# Patient Record
Sex: Female | Born: 1975 | Race: Black or African American | Hispanic: No | Smoking: Current every day smoker
Health system: Southern US, Community
[De-identification: ages and names within clinical notes are randomized; demographics above are authoritative.]

## PROBLEM LIST (undated history)

## (undated) DIAGNOSIS — E119 Type 2 diabetes mellitus without complications: Secondary | ICD-10-CM

## (undated) DIAGNOSIS — G4730 Sleep apnea, unspecified: Secondary | ICD-10-CM

## (undated) DIAGNOSIS — I10 Essential (primary) hypertension: Secondary | ICD-10-CM

## (undated) DIAGNOSIS — Z5189 Encounter for other specified aftercare: Secondary | ICD-10-CM

## (undated) HISTORY — PX: TONSILLECTOMY: SUR1361

## (undated) HISTORY — PX: SPLENECTOMY: SUR1306

---

## 2004-04-26 ENCOUNTER — Emergency Department (HOSPITAL_COMMUNITY): Admission: EM | Admit: 2004-04-26 | Discharge: 2004-04-26 | Payer: Self-pay | Admitting: Emergency Medicine

## 2014-02-23 ENCOUNTER — Encounter (HOSPITAL_BASED_OUTPATIENT_CLINIC_OR_DEPARTMENT_OTHER): Payer: Self-pay | Admitting: *Deleted

## 2014-02-23 ENCOUNTER — Emergency Department (HOSPITAL_BASED_OUTPATIENT_CLINIC_OR_DEPARTMENT_OTHER): Payer: Medicare HMO

## 2014-02-23 ENCOUNTER — Emergency Department (HOSPITAL_BASED_OUTPATIENT_CLINIC_OR_DEPARTMENT_OTHER)
Admission: EM | Admit: 2014-02-23 | Discharge: 2014-02-23 | Disposition: A | Discharge status: Home / Self Care | Payer: Medicare HMO | Attending: Emergency Medicine | Admitting: Emergency Medicine

## 2014-02-23 DIAGNOSIS — K2100 Gastro-esophageal reflux disease with esophagitis, without bleeding: Secondary | ICD-10-CM

## 2014-02-23 DIAGNOSIS — Z79899 Other long term (current) drug therapy: Secondary | ICD-10-CM | POA: Diagnosis not present

## 2014-02-23 DIAGNOSIS — Z720 Tobacco use: Secondary | ICD-10-CM | POA: Insufficient documentation

## 2014-02-23 DIAGNOSIS — I10 Essential (primary) hypertension: Secondary | ICD-10-CM | POA: Insufficient documentation

## 2014-02-23 DIAGNOSIS — E119 Type 2 diabetes mellitus without complications: Secondary | ICD-10-CM | POA: Insufficient documentation

## 2014-02-23 DIAGNOSIS — G4730 Sleep apnea, unspecified: Secondary | ICD-10-CM | POA: Insufficient documentation

## 2014-02-23 DIAGNOSIS — Z794 Long term (current) use of insulin: Secondary | ICD-10-CM | POA: Insufficient documentation

## 2014-02-23 DIAGNOSIS — R079 Chest pain, unspecified: Secondary | ICD-10-CM | POA: Diagnosis present

## 2014-02-23 HISTORY — DX: Type 2 diabetes mellitus without complications: E11.9

## 2014-02-23 HISTORY — DX: Essential (primary) hypertension: I10

## 2014-02-23 HISTORY — DX: Sleep apnea, unspecified: G47.30

## 2014-02-23 LAB — CBC
HEMATOCRIT: 40.7 % (ref 36.0–46.0)
Hemoglobin: 13.1 g/dL (ref 12.0–15.0)
MCH: 26.5 pg (ref 26.0–34.0)
MCHC: 32.2 g/dL (ref 30.0–36.0)
MCV: 82.4 fL (ref 78.0–100.0)
PLATELETS: 378 10*3/uL (ref 150–400)
RBC: 4.94 MIL/uL (ref 3.87–5.11)
RDW: 13.4 % (ref 11.5–15.5)
WBC: 11.4 10*3/uL — ABNORMAL HIGH (ref 4.0–10.5)

## 2014-02-23 LAB — COMPREHENSIVE METABOLIC PANEL
ALBUMIN: 3.6 g/dL (ref 3.5–5.2)
ALT: 21 U/L (ref 0–35)
AST: 20 U/L (ref 0–37)
Alkaline Phosphatase: 106 U/L (ref 39–117)
Anion gap: 9 (ref 5–15)
BUN: 13 mg/dL (ref 6–23)
CO2: 27 mmol/L (ref 19–32)
CREATININE: 0.63 mg/dL (ref 0.50–1.10)
Calcium: 8.9 mg/dL (ref 8.4–10.5)
Chloride: 96 mmol/L (ref 96–112)
Glucose, Bld: 330 mg/dL — ABNORMAL HIGH (ref 70–99)
Potassium: 3.9 mmol/L (ref 3.5–5.1)
Sodium: 132 mmol/L — ABNORMAL LOW (ref 135–145)
TOTAL PROTEIN: 8.4 g/dL — AB (ref 6.0–8.3)

## 2014-02-23 LAB — TROPONIN I: Troponin I: <0.03 ng/mL (ref <0.031)

## 2014-02-23 LAB — LIPASE, BLOOD: Lipase: 28 U/L (ref 11–59)

## 2014-02-23 MED ORDER — FAMOTIDINE 20 MG PO TABS
40.0000 mg | ORAL_TABLET | Freq: Once | ORAL | Status: AC
  Administered 2014-02-23: 40 mg via ORAL
  Filled 2014-02-23: qty 2

## 2014-02-23 MED ORDER — SUCRALFATE 1 G PO TABS: 1.0000 g | ORAL_TABLET | Freq: Four times a day (QID) | ORAL | Status: DC

## 2014-02-23 MED ORDER — PANTOPRAZOLE SODIUM 20 MG PO TBEC: 20.0000 mg | DELAYED_RELEASE_TABLET | Freq: Every day | ORAL | Status: DC

## 2014-02-23 MED ORDER — GI COCKTAIL ~~LOC~~
30.0000 mL | Freq: Once | ORAL | Status: AC
  Administered 2014-02-23: 30 mL via ORAL
  Filled 2014-02-23: qty 30

## 2014-02-23 NOTE — ED Notes (Addendum)
Pt c/o central chest pain x 1 day, seen by PMD yesterday for same. Asprin 650 mg PTA, f/u with cardiologist

## 2014-02-23 NOTE — ED Provider Notes (Signed)
CSN: [id number]     Arrival date & time 02/23/14  1745 History  This chart was scribed for Toy Baker, MD by Freida Busman, ED Scribe. This patient was seen in room MH12/MH12 and the patient's care was started 6:11 PM.    Chief Complaint  Patient presents with  . Chest Pain     The history is provided by the patient. No language interpreter was used.     HPI Comments:  Cheryl Stokes is a 39 y.o. female who presents to the Emergency Department complaining of constant non radiating epigastric pain that initially started several days ago but has progressively worsened since onset. She reports associated nausea. She states she was evaluated by her PCP yesterday for her pain and was told there wasn't a lot of oxygen going to her heart. She was referred to a cardiologist for follow up. Her pain is exacerbated when ambulating. She has taken Tums without relief. She has also taken ASA today with minimal relief. She also notes burping alleviated some of her pain. She denies diaphoresis, SOB and vomiting. No anginal type symptoms   Past Medical History  Diagnosis Date  . Diabetes mellitus without complication   . Hypertension   . Sleep apnea    Past Surgical History  Procedure Laterality Date  . Tonsillectomy     History reviewed. No pertinent family history. History  Substance Use Topics  . Smoking status: Current Every Day Smoker -- 0.50 packs/day    Types: Cigarettes  . Smokeless tobacco: Not on file  . Alcohol Use: No   OB History    No data available     Review of Systems  Constitutional: Negative for fever and diaphoresis.  Respiratory: Negative for shortness of breath.   Gastrointestinal: Positive for nausea and abdominal pain. Negative for vomiting.  All other systems reviewed and are negative.     Allergies  Metformin and related and Mobic  Home Medications   Prior to Admission medications   Medication Sig Start Date End Date Taking? Authorizing Provider   hydrochlorothiazide (HYDRODIURIL) 25 MG tablet Take 25 mg by mouth daily.   Yes Historical Provider, MD  insulin lispro protamine-lispro (HUMALOG 75/25 MIX) (75-25) 100 UNIT/ML SUSP injection Inject 30 Units into the skin daily with breakfast.   Yes Historical Provider, MD  insulin lispro protamine-lispro (HUMALOG 75/25 MIX) (75-25) 100 UNIT/ML SUSP injection Inject 15 Units into the skin daily with supper.   Yes Historical Provider, MD  oxyCODONE-acetaminophen (PERCOCET) 10-325 MG per tablet Take 1 tablet by mouth every 4 (four) hours as needed for pain.   Yes Historical Provider, MD  traZODone (DESYREL) 150 MG tablet Take 150 mg by mouth at bedtime.   Yes Historical Provider, MD   There were no vitals taken for this visit. Physical Exam  Constitutional: She is oriented to person, place, and time. She appears well-developed and well-nourished.  Non-toxic appearance. No distress.  HENT:  Head: Normocephalic and atraumatic.  Eyes: Conjunctivae, EOM and lids are normal. Pupils are equal, round, and reactive to light.  Neck: Normal range of motion. Neck supple. No tracheal deviation present. No thyroid mass present.  Cardiovascular: Normal rate, regular rhythm and normal heart sounds.  Exam reveals no gallop.   No murmur heard. Pulmonary/Chest: Effort normal and breath sounds normal. No stridor. No respiratory distress. She has no decreased breath sounds. She has no wheezes. She has no rhonchi. She has no rales.  Abdominal: Soft. Normal appearance and bowel sounds are normal.  She exhibits no distension. There is tenderness (Epigastric ). There is no rebound and no CVA tenderness.  Musculoskeletal: Normal range of motion. She exhibits no edema or tenderness.  Neurological: She is alert and oriented to person, place, and time. She has normal strength. No cranial nerve deficit or sensory deficit. GCS eye subscore is 4. GCS verbal subscore is 5. GCS motor subscore is 6.  Skin: Skin is warm and dry. No  abrasion and no rash noted.  Psychiatric: She has a normal mood and affect. Her speech is normal and behavior is normal.  Nursing note and vitals reviewed.   ED Course  Procedures  DIAGNOSTIC STUDIES:  Oxygen Saturation is 99% on ra, nl by my interpretation.    COORDINATION OF CARE:  6:17 PM Will order a CXR and blood work including cardiac enzymes.  Discussed treatment plan with pt at bedside and pt agreed to plan.  Labs Review Labs Reviewed  TROPONIN I  CBC  COMPREHENSIVE METABOLIC PANEL    Imaging Review No results found.   EKG Interpretation None        Date: 02/23/2014  Rate: 86  Rhythm: normal sinus rhythm  QRS Axis: right  Intervals: normal  ST/T Wave abnormalities: normal  Conduction Disutrbances:none  Narrative Interpretation:   Old EKG Reviewed: none available   MDM   Final diagnoses:  None   I personally performed the services described in this documentation, which was scribed in my presence. The recorded information has been reviewed and is accurate.    7:51 PM Patient given GI cocktail and feels better. Suspect the patient has GERD and do not think that this represents ACS or any acute cardiopulmonary condition. Patient be placed on medications for reflux and discharged home  Toy BakerAnthony T Rickard Kennerly, MD 02/23/14 [phone]

## 2014-02-23 NOTE — ED Notes (Signed)
Patient transported to X-ray 

## 2014-02-23 NOTE — Discharge Instructions (Signed)
Esophagitis Esophagitis is inflammation of the esophagus. It can involve swelling, soreness, and pain in the esophagus. This condition can make it difficult and painful to swallow. CAUSES  Most causes of esophagitis are not serious. Many different factors can cause esophagitis, including:  Gastroesophageal reflux disease (GERD). This is when acid from your stomach flows up into the esophagus.  Recurrent vomiting.  An allergic-type reaction.  Certain medicines, especially those that come in large pills.  Ingestion of harmful chemicals, such as household cleaning products.  Heavy alcohol use.  An infection of the esophagus.  Radiation treatment for cancer.  Certain diseases such as sarcoidosis, Crohn's disease, and scleroderma. These diseases may cause recurrent esophagitis. SYMPTOMS   Trouble swallowing.  Painful swallowing.  Chest pain.  Difficulty breathing.  Nausea.  Vomiting.  Abdominal pain. DIAGNOSIS  Your caregiver will take your history and do a physical exam. Depending upon what your caregiver finds, certain tests may also be done, including:  Barium X-ray. You will drink a solution that coats the esophagus, and X-rays will be taken.  Endoscopy. A lighted tube is put down the esophagus so your caregiver can examine the area.  Allergy tests. These can sometimes be arranged through follow-up visits. TREATMENT  Treatment will depend on the cause of your esophagitis. In some cases, steroids or other medicines may be given to help relieve your symptoms or to treat the underlying cause of your condition. Medicines that may be recommended include:  Viscous lidocaine, to soothe the esophagus.  Antacids.  Acid reducers.  Proton pump inhibitors.  Antiviral medicines for certain viral infections of the esophagus.  Antifungal medicines for certain fungal infections of the esophagus.  Antibiotic medicines, depending on the cause of the esophagitis. HOME CARE  INSTRUCTIONS   Avoid foods and drinks that seem to make your symptoms worse.  Eat small, frequent meals instead of large meals.  Avoid eating for the 3 hours prior to your bedtime.  If you have trouble taking pills, use a pill splitter to decrease the size and likelihood of the pill getting stuck or injuring the esophagus on the way down. Drinking water after taking a pill also helps.  Stop smoking if you smoke.  Maintain a healthy weight.  Wear loose-fitting clothing. Do not wear anything tight around your waist that causes pressure on your stomach.  Raise the head of your bed 6 to 8 inches with wood blocks to help you sleep. Extra pillows will not help.  Only take over-the-counter or prescription medicines as directed by your caregiver. SEEK IMMEDIATE MEDICAL CARE IF:  You have severe chest pain that radiates into your arm, neck, or jaw.  You feel sweaty, dizzy, or lightheaded.  You have shortness of breath.  You vomit blood.  You have difficulty or pain with swallowing.  You have bloody or black, tarry stools.  You have a fever.  You have a burning sensation in the chest more than 3 times a week for more than 2 weeks.  You cannot swallow, drink, or eat.  You drool because you cannot swallow your saliva. MAKE SURE YOU:  Understand these instructions.  Will watch your condition.  Will get help right away if you are not doing well or get worse. Document Released: 02/09/2004 Document Revised: 03/26/2011 Document Reviewed: 09/01/2010 [employer] Patient Information 2015 [employer]. This information is not intended to replace advice given to you by your health care provider. Make sure you discuss any questions you have with your health care provider.  

## 2014-02-23 NOTE — ED Notes (Signed)
MD at bedside. 

## 2014-03-09 ENCOUNTER — Emergency Department (HOSPITAL_BASED_OUTPATIENT_CLINIC_OR_DEPARTMENT_OTHER): Payer: Medicare HMO

## 2014-03-09 ENCOUNTER — Encounter (HOSPITAL_BASED_OUTPATIENT_CLINIC_OR_DEPARTMENT_OTHER): Payer: Self-pay | Admitting: *Deleted

## 2014-03-09 ENCOUNTER — Emergency Department (HOSPITAL_BASED_OUTPATIENT_CLINIC_OR_DEPARTMENT_OTHER)
Admission: EM | Admit: 2014-03-09 | Discharge: 2014-03-09 | Disposition: A | Discharge status: Home / Self Care | Payer: Medicare HMO | Attending: Emergency Medicine | Admitting: Emergency Medicine

## 2014-03-09 DIAGNOSIS — Y9301 Activity, walking, marching and hiking: Secondary | ICD-10-CM | POA: Diagnosis not present

## 2014-03-09 DIAGNOSIS — I10 Essential (primary) hypertension: Secondary | ICD-10-CM | POA: Diagnosis not present

## 2014-03-09 DIAGNOSIS — S161XXA Strain of muscle, fascia and tendon at neck level, initial encounter: Secondary | ICD-10-CM | POA: Diagnosis not present

## 2014-03-09 DIAGNOSIS — S3992XA Unspecified injury of lower back, initial encounter: Secondary | ICD-10-CM | POA: Diagnosis present

## 2014-03-09 DIAGNOSIS — E1165 Type 2 diabetes mellitus with hyperglycemia: Secondary | ICD-10-CM | POA: Diagnosis not present

## 2014-03-09 DIAGNOSIS — W06XXXA Fall from bed, initial encounter: Secondary | ICD-10-CM | POA: Insufficient documentation

## 2014-03-09 DIAGNOSIS — Z794 Long term (current) use of insulin: Secondary | ICD-10-CM | POA: Insufficient documentation

## 2014-03-09 DIAGNOSIS — Z3202 Encounter for pregnancy test, result negative: Secondary | ICD-10-CM | POA: Insufficient documentation

## 2014-03-09 DIAGNOSIS — R55 Syncope and collapse: Secondary | ICD-10-CM | POA: Insufficient documentation

## 2014-03-09 DIAGNOSIS — Z8669 Personal history of other diseases of the nervous system and sense organs: Secondary | ICD-10-CM | POA: Insufficient documentation

## 2014-03-09 DIAGNOSIS — Y998 Other external cause status: Secondary | ICD-10-CM | POA: Insufficient documentation

## 2014-03-09 DIAGNOSIS — Y92193 Bedroom in other specified residential institution as the place of occurrence of the external cause: Secondary | ICD-10-CM | POA: Diagnosis not present

## 2014-03-09 DIAGNOSIS — Z79899 Other long term (current) drug therapy: Secondary | ICD-10-CM | POA: Insufficient documentation

## 2014-03-09 DIAGNOSIS — Z720 Tobacco use: Secondary | ICD-10-CM | POA: Diagnosis not present

## 2014-03-09 DIAGNOSIS — S8391XA Sprain of unspecified site of right knee, initial encounter: Secondary | ICD-10-CM | POA: Diagnosis not present

## 2014-03-09 DIAGNOSIS — S39012A Strain of muscle, fascia and tendon of lower back, initial encounter: Secondary | ICD-10-CM | POA: Diagnosis not present

## 2014-03-09 LAB — COMPREHENSIVE METABOLIC PANEL
ALT: 18 U/L (ref 0–35)
ANION GAP: 3 — AB (ref 5–15)
AST: 21 U/L (ref 0–37)
Albumin: 3.4 g/dL — ABNORMAL LOW (ref 3.5–5.2)
Alkaline Phosphatase: 98 U/L (ref 39–117)
BILIRUBIN TOTAL: 0.1 mg/dL — AB (ref 0.3–1.2)
BUN: 16 mg/dL (ref 6–23)
CALCIUM: 8.6 mg/dL (ref 8.4–10.5)
CHLORIDE: 101 mmol/L (ref 96–112)
CO2: 25 mmol/L (ref 19–32)
CREATININE: 0.81 mg/dL (ref 0.50–1.10)
GFR, EST NON AFRICAN AMERICAN: 90 mL/min — AB (ref >90)
GLUCOSE: 328 mg/dL — AB (ref 70–99)
POTASSIUM: 4.1 mmol/L (ref 3.5–5.1)
SODIUM: 129 mmol/L — AB (ref 135–145)
Total Protein: 7.8 g/dL (ref 6.0–8.3)

## 2014-03-09 LAB — PREGNANCY, URINE: Preg Test, Ur: NEGATIVE

## 2014-03-09 LAB — CBC WITH DIFFERENTIAL/PLATELET
BASOS ABS: 0.1 10*3/uL (ref 0.0–0.1)
BASOS PCT: 1 % (ref 0–1)
Eosinophils Absolute: 0.2 10*3/uL (ref 0.0–0.7)
Eosinophils Relative: 2 % (ref 0–5)
HEMATOCRIT: 40.5 % (ref 36.0–46.0)
Hemoglobin: 13.4 g/dL (ref 12.0–15.0)
Lymphocytes Relative: 30 % (ref 12–46)
Lymphs Abs: 3 10*3/uL (ref 0.7–4.0)
MCH: 27.2 pg (ref 26.0–34.0)
MCHC: 33.1 g/dL (ref 30.0–36.0)
MCV: 82.3 fL (ref 78.0–100.0)
MONO ABS: 0.8 10*3/uL (ref 0.1–1.0)
Monocytes Relative: 8 % (ref 3–12)
NEUTROS ABS: 6 10*3/uL (ref 1.7–7.7)
Neutrophils Relative %: 59 % (ref 43–77)
Platelets: 352 10*3/uL (ref 150–400)
RBC: 4.92 MIL/uL (ref 3.87–5.11)
RDW: 13.4 % (ref 11.5–15.5)
WBC: 10 10*3/uL (ref 4.0–10.5)

## 2014-03-09 LAB — URINALYSIS, ROUTINE W REFLEX MICROSCOPIC
Bilirubin Urine: NEGATIVE
Hgb urine dipstick: NEGATIVE
Ketones, ur: NEGATIVE mg/dL
LEUKOCYTES UA: NEGATIVE
Nitrite: NEGATIVE
PH: 6.5 (ref 5.0–8.0)
Protein, ur: NEGATIVE mg/dL
Specific Gravity, Urine: 1.035 — ABNORMAL HIGH (ref 1.005–1.030)
Urobilinogen, UA: 0.2 mg/dL (ref 0.0–1.0)

## 2014-03-09 LAB — D-DIMER, QUANTITATIVE (NOT AT ARMC): D DIMER QUANT: 0.27 ug{FEU}/mL (ref 0.00–0.48)

## 2014-03-09 LAB — URINE MICROSCOPIC-ADD ON

## 2014-03-09 LAB — TROPONIN I: Troponin I: <0.03 ng/mL (ref <0.031)

## 2014-03-09 MED ORDER — HYDROMORPHONE HCL 1 MG/ML IJ SOLN
1.0000 mg | Freq: Once | INTRAMUSCULAR | Status: AC
  Administered 2014-03-09: 1 mg via INTRAVENOUS
  Filled 2014-03-09: qty 1

## 2014-03-09 MED ORDER — HYDROMORPHONE HCL 1 MG/ML IJ SOLN
1.0000 mg | Freq: Once | INTRAMUSCULAR | Status: AC
  Administered 2014-03-09: 1 mg via INTRAVENOUS

## 2014-03-09 MED ORDER — SODIUM CHLORIDE 0.9 % IV BOLUS (SEPSIS)
1000.0000 mL | Freq: Once | INTRAVENOUS | Status: AC
  Administered 2014-03-09: 1000 mL via INTRAVENOUS

## 2014-03-09 MED ORDER — OXYCODONE-ACETAMINOPHEN 10-325 MG PO TABS: 1.0000 | ORAL_TABLET | Freq: Four times a day (QID) | ORAL | Status: DC | PRN

## 2014-03-09 NOTE — ED Notes (Signed)
Pt attempted to sit on bedside to complete orthostatic vitals and was unable to do so due to pain.

## 2014-03-09 NOTE — ED Notes (Addendum)
Pt brought in by EMS from home fall from bed c/o lower back pain right hip and knee pain.

## 2014-03-09 NOTE — Discharge Instructions (Signed)
Back Pain, Adult Low back pain is very common. About 1 in 5 people have back pain.The cause of low back pain is rarely dangerous. The pain often gets better over time.About half of people with a sudden onset of back pain feel better in just 2 weeks. About 8 in 10 people feel better by 6 weeks.  CAUSES Some common causes of back pain include:  Strain of the muscles or ligaments supporting the spine.  Wear and tear (degeneration) of the spinal discs.  Arthritis.  Direct injury to the back. DIAGNOSIS Most of the time, the direct cause of low back pain is not known.However, back pain can be treated effectively even when the exact cause of the pain is unknown.Answering your caregiver's questions about your overall health and symptoms is one of the most accurate ways to make sure the cause of your pain is not dangerous. If your caregiver needs more information, he or she may order lab work or imaging tests (X-rays or MRIs).However, even if imaging tests show changes in your back, this usually does not require surgery. HOME CARE INSTRUCTIONS For many people, back pain returns.Since low back pain is rarely dangerous, it is often a condition that people can learn to [facility] their own.   Remain active. It is stressful on the back to sit or stand in one place. Do not sit, drive, or stand in one place for more than 30 minutes at a time. Take short walks on level surfaces as soon as pain allows.Try to increase the length of time you walk each day.  Do not stay in bed.Resting more than 1 or 2 days can delay your recovery.  Do not avoid exercise or work.Your body is made to move.It is not dangerous to be active, even though your back may hurt.Your back will likely heal faster if you return to being active before your pain is gone.  Pay attention to your body when you bend and lift. Many people have less discomfortwhen lifting if they bend their knees, keep the load close to their bodies,and  avoid twisting. Often, the most comfortable positions are those that put less stress on your recovering back.  Find a comfortable position to sleep. Use a firm mattress and lie on your side with your knees slightly bent. If you lie on your back, put a pillow under your knees.  Only take over-the-counter or prescription medicines as directed by your caregiver. Over-the-counter medicines to reduce pain and inflammation are often the most helpful.Your caregiver may prescribe muscle relaxant drugs.These medicines help dull your pain so you can more quickly return to your normal activities and healthy exercise.  Put ice on the injured area.  Put ice in a plastic bag.  Place a towel between your skin and the bag.  Leave the ice on for 15-20 minutes, 03-04 times a day for the first 2 to 3 days. After that, ice and heat may be alternated to reduce pain and spasms.  Ask your caregiver about trying back exercises and gentle massage. This may be of some benefit.  Avoid feeling anxious or stressed.Stress increases muscle tension and can worsen back pain.It is important to recognize when you are anxious or stressed and learn ways to manage it.Exercise is a great option. SEEK MEDICAL CARE IF:  You have pain that is not relieved with rest or medicine.  You have pain that does not improve in 1 week.  You have new symptoms.  You are generally not feeling well. SEEK  IMMEDIATE MEDICAL CARE IF:   You have pain that radiates from your back into your legs.  You develop new bowel or bladder control problems.  You have unusual weakness or numbness in your arms or legs.  You develop nausea or vomiting.  You develop abdominal pain.  You feel faint. Document Released: 01/01/2005 Document Revised: 07/03/2011 Document Reviewed: 05/05/2013 [facility] Patient Information 2015 [place], [place]. This information is not intended to replace advice given to you by your health care provider. Make sure you  discuss any questions you have with your health care provider.  Diabetes Mellitus and Food It is important for you to manage your blood sugar (glucose) level. Your blood glucose level can be greatly affected by what you eat. Eating healthier foods in the appropriate amounts throughout the day at about the same time each day will help you control your blood glucose level. It can also help slow or prevent worsening of your diabetes mellitus. Healthy eating may even help you improve the level of your blood pressure and reach or maintain a healthy weight.  HOW CAN FOOD AFFECT ME? Carbohydrates Carbohydrates affect your blood glucose level more than any other type of food. Your dietitian will help you determine how many carbohydrates to eat at each meal and teach you how to count carbohydrates. Counting carbohydrates is important to keep your blood glucose at a healthy level, especially if you are using insulin or taking certain medicines for diabetes mellitus. Alcohol Alcohol can cause sudden decreases in blood glucose (hypoglycemia), especially if you use insulin or take certain medicines for diabetes mellitus. Hypoglycemia can be a life-threatening condition. Symptoms of hypoglycemia (sleepiness, dizziness, and disorientation) are similar to symptoms of having too much alcohol.  If your health care provider has given you approval to drink alcohol, do so in moderation and use the following guidelines:  Women should not have more than one drink per day, and men should not have more than two drinks per day. One drink is equal to:  12 oz of beer.  5 oz of wine.  1 oz of hard liquor.  Do not drink on an empty stomach.  Keep yourself hydrated. Have water, diet soda, or unsweetened iced tea.  Regular soda, juice, and other mixers might contain a lot of carbohydrates and should be counted. WHAT FOODS ARE NOT RECOMMENDED? As you make food choices, it is important to remember that all foods are not the  same. Some foods have fewer nutrients per serving than other foods, even though they might have the same number of calories or carbohydrates. It is difficult to get your body what it needs when you eat foods with fewer nutrients. Examples of foods that you should avoid that are high in calories and carbohydrates but low in nutrients include:  Trans fats (most processed foods list trans fats on the Nutrition Facts label).  Regular soda.  Juice.  Candy.  Sweets, such as cake, pie, doughnuts, and cookies.  Fried foods. WHAT FOODS CAN I EAT? Have nutrient-rich foods, which will nourish your body and keep you healthy. The food you should eat also will depend on several factors, including:  The calories you need.  The medicines you take.  Your weight.  Your blood glucose level.  Your blood pressure level.  Your cholesterol level. You also should eat a variety of foods, including:  Protein, such as meat, poultry, fish, tofu, nuts, and seeds (lean animal proteins are best).  Fruits.  Vegetables.  Dairy products, such  as milk, cheese, and yogurt (low fat is best).  Breads, grains, pasta, cereal, rice, and beans.  Fats such as olive oil, trans fat-free margarine, canola oil, avocado, and olives. DOES EVERYONE WITH DIABETES MELLITUS HAVE THE SAME MEAL PLAN? Because every person with diabetes mellitus is different, there is not one meal plan that works for everyone. It is very important that you meet with a dietitian who will help you create a meal plan that is just right for you. Document Released: 09/28/2004 Document Revised: 01/06/2013 Document Reviewed: 11/28/2012 [facility] Patient Information 2015 [place], [place]. This information is not intended to replace advice given to you by your health care provider. Make sure you discuss any questions you have with your health care provider.

## 2014-03-09 NOTE — ED Notes (Signed)
c collar removed by EDP 

## 2014-03-09 NOTE — ED Provider Notes (Signed)
CSN: [id number]     Arrival date & time 03/09/14  1342 History   First MD Initiated Contact with Patient 03/09/14 1342     Chief Complaint  Patient presents with  . Fall     Patient is a 39 y.o. female presenting with fall. The history is provided by the patient and a friend. No language interpreter was used.  Fall   Cheryl Stokes presents for evaluation of injuries following a syncopal event today. She was walking near the bed at home when she fell face forward onto the ground. Per report by friend she looked like she was dizzy and wobbly before she fell and she was unconscious for about a minute. When she woke she said she was thirsty. She complains of pain in her neck and her low back and her right knee. She denies any preceding symptoms. She has had chest pain for the last week that is improved after starting Protonix. Eyes any other recent illnesses such as fevers, coughing, abdominal pain, vomiting, diarrhea. She did have a little bit shortness of breath last night which has since resolved.  Past Medical History  Diagnosis Date  . Diabetes mellitus without complication   . Hypertension   . Sleep apnea    Past Surgical History  Procedure Laterality Date  . Tonsillectomy     History reviewed. No pertinent family history. History  Substance Use Topics  . Smoking status: Current Every Day Smoker -- 0.50 packs/day    Types: Cigarettes  . Smokeless tobacco: Not on file  . Alcohol Use: No   OB History    No data available     Review of Systems  All other systems reviewed and are negative.     Allergies  Metformin and related and Mobic  Home Medications   Prior to Admission medications   Medication Sig Start Date End Date Taking? Authorizing Provider  hydrochlorothiazide (HYDRODIURIL) 25 MG tablet Take 25 mg by mouth daily.    Historical Provider, MD  insulin lispro protamine-lispro (HUMALOG 75/25 MIX) (75-25) 100 UNIT/ML SUSP injection Inject 30 Units into the skin  daily with breakfast.    Historical Provider, MD  insulin lispro protamine-lispro (HUMALOG 75/25 MIX) (75-25) 100 UNIT/ML SUSP injection Inject 15 Units into the skin daily with supper.    Historical Provider, MD  oxyCODONE-acetaminophen (PERCOCET) 10-325 MG per tablet Take 1 tablet by mouth every 4 (four) hours as needed for pain.    Historical Provider, MD  pantoprazole (PROTONIX) 20 MG tablet Take 1 tablet (20 mg total) by mouth daily. 02/23/14   Toy Baker, MD  sucralfate (CARAFATE) 1 G tablet Take 1 tablet (1 g total) by mouth 4 (four) times daily. 02/23/14   Toy Baker, MD  traZODone (DESYREL) 150 MG tablet Take 150 mg by mouth at bedtime.    Historical Provider, MD   BP 126/84 mmHg  Pulse 92  Temp(Src) 97.9 F (36.6 C) (Oral)  Resp 20  SpO2 100% Physical Exam  Constitutional: She is oriented to person, place, and time. She appears well-developed and well-nourished. She appears distressed.  HENT:  Head: Normocephalic and atraumatic.  Eyes: EOM are normal. Pupils are equal, round, and reactive to light.  Neck:  Lower C-spine tenderness to palpation.  Cardiovascular: Normal rate and regular rhythm.   No murmur heard. Pulmonary/Chest: Effort normal and breath sounds normal. No respiratory distress.  Abdominal: Soft. There is no tenderness. There is no rebound and no guarding.  Musculoskeletal: She exhibits no  edema.  Tenderness to palpation over the lower lumbar spine. Tenderness to palpation over the right knee without any palpable effusion. There is no hip tenderness.  Neurological: She is alert and oriented to person, place, and time.  Skin: Skin is warm and dry.  Psychiatric: She has a normal mood and affect. Her behavior is normal.  Nursing note and vitals reviewed.   ED Course  Procedures (including critical care time) Labs Review Labs Reviewed  COMPREHENSIVE METABOLIC PANEL - Abnormal; Notable for the following:    Sodium 129 (*)    Glucose, Bld 328 (*)     Albumin 3.4 (*)    Total Bilirubin 0.1 (*)    GFR calc non Af Amer 90 (*)    Anion gap 3 (*)    All other components within normal limits  URINALYSIS, ROUTINE W REFLEX MICROSCOPIC - Abnormal; Notable for the following:    Specific Gravity, Urine 1.035 (*)    Glucose, UA >1000 (*)    All other components within normal limits  TROPONIN I  CBC WITH DIFFERENTIAL/PLATELET  D-DIMER, QUANTITATIVE  PREGNANCY, URINE  URINE MICROSCOPIC-ADD ON    Imaging Review Dg Chest 1 View  03/09/2014   CLINICAL DATA:  Fall in the bedroom this morning, low back pain  EXAM: CHEST  1 VIEW  COMPARISON:  02/23/2014  FINDINGS: Borderline cardiomegaly. No acute infiltrate or pleural effusion. No pulmonary edema. No gross fractures are identified. Study is limited by patient's large body habitus. No pneumothorax. Degenerative changes mid and lower thoracic spine.  IMPRESSION: No active disease.   Electronically Signed   By: Natasha MeadLiviu  Pop M.D.   On: 03/09/2014 14:44   Dg Lumbar Spine Complete  03/09/2014   CLINICAL DATA:  Fall this morning, low back pain  EXAM: LUMBAR SPINE - COMPLETE 4+ VIEW  COMPARISON:  None.  FINDINGS: Five views of lumbar spine submitted. No acute fracture or subluxation. Moderate disc space flattening with anterior spurring and endplate sclerotic changes at L4-L5 level. Significant disc space flattening with vacuum disc phenomenon and mild anterior spurring at L5-S1 level. Facet degenerative changes L4 and L5 level.  IMPRESSION: No lumbar spine acute fracture or subluxation. Degenerative changes L4-L5 and L5-S1 level.   Electronically Signed   By: Natasha MeadLiviu  Pop M.D.   On: 03/09/2014 14:45   Ct Head Wo Contrast  03/09/2014   CLINICAL DATA:  Fall this morning, right side neck pain  EXAM: CT HEAD WITHOUT CONTRAST  CT CERVICAL SPINE WITHOUT CONTRAST  TECHNIQUE: Multidetector CT imaging of the head and cervical spine was performed following the standard protocol without intravenous contrast. Multiplanar CT image  reconstructions of the cervical spine were also generated.  COMPARISON:  None.  FINDINGS: CT HEAD FINDINGS  No skull fracture is noted. Paranasal sinuses and mastoid air cells are unremarkable. No intracranial hemorrhage, mass effect or midline shift.  No acute cortical infarction. No mass lesion is noted on this unenhanced scan.  CT CERVICAL SPINE FINDINGS  Axial images of the cervical spine shows no acute fracture or subluxation. Computer processed images shows no acute fracture or subluxation. Mild degenerative changes C1-C2 articulation. There is no pneumothorax in visualized lung apices. No prevertebral soft tissue swelling. Cervical airway is patent.  IMPRESSION: 1. No acute intracranial abnormality. 2. No cervical spine acute fracture or subluxation. Minimal degenerative changes.   Electronically Signed   By: Natasha MeadLiviu  Pop M.D.   On: 03/09/2014 14:42   Ct Cervical Spine Wo Contrast  03/09/2014   CLINICAL  DATA:  Fall this morning, right side neck pain  EXAM: CT HEAD WITHOUT CONTRAST  CT CERVICAL SPINE WITHOUT CONTRAST  TECHNIQUE: Multidetector CT imaging of the head and cervical spine was performed following the standard protocol without intravenous contrast. Multiplanar CT image reconstructions of the cervical spine were also generated.  COMPARISON:  None.  FINDINGS: CT HEAD FINDINGS  No skull fracture is noted. Paranasal sinuses and mastoid air cells are unremarkable. No intracranial hemorrhage, mass effect or midline shift.  No acute cortical infarction. No mass lesion is noted on this unenhanced scan.  CT CERVICAL SPINE FINDINGS  Axial images of the cervical spine shows no acute fracture or subluxation. Computer processed images shows no acute fracture or subluxation. Mild degenerative changes C1-C2 articulation. There is no pneumothorax in visualized lung apices. No prevertebral soft tissue swelling. Cervical airway is patent.  IMPRESSION: 1. No acute intracranial abnormality. 2. No cervical spine acute  fracture or subluxation. Minimal degenerative changes.   Electronically Signed   By: Natasha Mead M.D.   On: 03/09/2014 14:42   Dg Knee Complete 4 Views Right  03/09/2014   CLINICAL DATA:  Fall this morning, back pain, right knee pain  EXAM: RIGHT KNEE - COMPLETE 4+ VIEW  COMPARISON:  None.  FINDINGS: Five views of the right knee submitted. No acute fracture or subluxation. No joint effusion. Minimal spurring of lateral femoral condyle and lateral tibial plateau.  IMPRESSION: No acute fracture or subluxation.  Minimal degenerative changes.   Electronically Signed   By: Natasha Mead M.D.   On: 03/09/2014 14:46     EKG Interpretation   Date/Time:  Tuesday March 09 2014 14:01:08 EST Ventricular Rate:  93 PR Interval:  144 QRS Duration: 82 QT Interval:  366 QTC Calculation: 455 R Axis:   65 Text Interpretation:  Sinus rhythm with occasional Premature ventricular  complexes Otherwise normal ECG Confirmed by Lincoln Brigham [phone]) on 03/09/2014  2:21:05 PM      MDM   Final diagnoses:  Syncope, unspecified syncope type  Low back strain, initial encounter  Knee sprain, right, initial encounter  Cervical strain, acute, initial encounter  Hyperglycemia due to type 2 diabetes mellitus    Patient here for evaluation of injuries following a fall. There is a questionable syncopal event prior to the fall. Clinical picture is not consistent with subarachnoid hemorrhage, cardiogenic syncope, ACS. There is no evidence of acute fracture or dislocation on exam and radiography. BMP demonstrates hyponatremia and hyperglycemia, question some elevation of dehydration due to uncontrolled blood sugars at home. Discussed with patient home care for diabetes with dietary discretion and oral fluid hydration with water. Providing IV fluid bolus clear. Plan to DC home with close PCP follow-up for recheck of her electrolytes and kidney function. Return precautions were discussed. Patient is a risk for PE and d-dimer is  negative.  Tilden Fossa, MD 03/10/14 [phone]

## 2014-08-29 ENCOUNTER — Emergency Department (HOSPITAL_BASED_OUTPATIENT_CLINIC_OR_DEPARTMENT_OTHER)
Admission: EM | Admit: 2014-08-29 | Discharge: 2014-08-29 | Disposition: A | Discharge status: Home / Self Care | Payer: Medicare HMO | Attending: Emergency Medicine | Admitting: Emergency Medicine

## 2014-08-29 ENCOUNTER — Encounter (HOSPITAL_BASED_OUTPATIENT_CLINIC_OR_DEPARTMENT_OTHER): Payer: Self-pay | Admitting: *Deleted

## 2014-08-29 ENCOUNTER — Emergency Department (HOSPITAL_BASED_OUTPATIENT_CLINIC_OR_DEPARTMENT_OTHER): Payer: Medicare HMO

## 2014-08-29 DIAGNOSIS — I10 Essential (primary) hypertension: Secondary | ICD-10-CM | POA: Diagnosis not present

## 2014-08-29 DIAGNOSIS — R1030 Lower abdominal pain, unspecified: Secondary | ICD-10-CM

## 2014-08-29 DIAGNOSIS — Z794 Long term (current) use of insulin: Secondary | ICD-10-CM | POA: Insufficient documentation

## 2014-08-29 DIAGNOSIS — Z8669 Personal history of other diseases of the nervous system and sense organs: Secondary | ICD-10-CM | POA: Diagnosis not present

## 2014-08-29 DIAGNOSIS — N760 Acute vaginitis: Secondary | ICD-10-CM | POA: Diagnosis not present

## 2014-08-29 DIAGNOSIS — Z87891 Personal history of nicotine dependence: Secondary | ICD-10-CM | POA: Insufficient documentation

## 2014-08-29 DIAGNOSIS — B9689 Other specified bacterial agents as the cause of diseases classified elsewhere: Secondary | ICD-10-CM

## 2014-08-29 DIAGNOSIS — Z3202 Encounter for pregnancy test, result negative: Secondary | ICD-10-CM | POA: Diagnosis not present

## 2014-08-29 DIAGNOSIS — E119 Type 2 diabetes mellitus without complications: Secondary | ICD-10-CM | POA: Diagnosis not present

## 2014-08-29 DIAGNOSIS — Z79899 Other long term (current) drug therapy: Secondary | ICD-10-CM | POA: Diagnosis not present

## 2014-08-29 LAB — URINE MICROSCOPIC-ADD ON

## 2014-08-29 LAB — URINALYSIS, ROUTINE W REFLEX MICROSCOPIC
Bilirubin Urine: NEGATIVE
Glucose, UA: 500 mg/dL — AB
KETONES UR: NEGATIVE mg/dL
Leukocytes, UA: NEGATIVE
Nitrite: NEGATIVE
Protein, ur: NEGATIVE mg/dL
SPECIFIC GRAVITY, URINE: 1.013 (ref 1.005–1.030)
Urobilinogen, UA: 0.2 mg/dL (ref 0.0–1.0)
pH: 6 (ref 5.0–8.0)

## 2014-08-29 LAB — COMPREHENSIVE METABOLIC PANEL
ALK PHOS: 89 U/L (ref 38–126)
ALT: 15 U/L (ref 14–54)
AST: 18 U/L (ref 15–41)
Albumin: 3.1 g/dL — ABNORMAL LOW (ref 3.5–5.0)
Anion gap: 8 (ref 5–15)
BILIRUBIN TOTAL: 0.4 mg/dL (ref 0.3–1.2)
BUN: 9 mg/dL (ref 6–20)
CO2: 25 mmol/L (ref 22–32)
Calcium: 8.8 mg/dL — ABNORMAL LOW (ref 8.9–10.3)
Chloride: 102 mmol/L (ref 101–111)
Creatinine, Ser: 0.46 mg/dL (ref 0.44–1.00)
GFR calc Af Amer: >60 mL/min (ref >60)
GFR calc non Af Amer: >60 mL/min (ref >60)
GLUCOSE: 247 mg/dL — AB (ref 65–99)
Potassium: 3.8 mmol/L (ref 3.5–5.1)
Sodium: 135 mmol/L (ref 135–145)
Total Protein: 7.2 g/dL (ref 6.5–8.1)

## 2014-08-29 LAB — WET PREP, GENITAL: Trich, Wet Prep: NONE SEEN

## 2014-08-29 LAB — CBC WITH DIFFERENTIAL/PLATELET
BASOS PCT: 1 % (ref 0–1)
Basophils Absolute: 0.1 10*3/uL (ref 0.0–0.1)
EOS ABS: 0.2 10*3/uL (ref 0.0–0.7)
Eosinophils Relative: 2 % (ref 0–5)
HCT: 38.9 % (ref 36.0–46.0)
Hemoglobin: 12.6 g/dL (ref 12.0–15.0)
LYMPHS ABS: 2.8 10*3/uL (ref 0.7–4.0)
Lymphocytes Relative: 31 % (ref 12–46)
MCH: 26.6 pg (ref 26.0–34.0)
MCHC: 32.4 g/dL (ref 30.0–36.0)
MCV: 82.2 fL (ref 78.0–100.0)
MONOS PCT: 9 % (ref 3–12)
Monocytes Absolute: 0.8 10*3/uL (ref 0.1–1.0)
Neutro Abs: 5.1 10*3/uL (ref 1.7–7.7)
Neutrophils Relative %: 57 % (ref 43–77)
PLATELETS: 322 10*3/uL (ref 150–400)
RBC: 4.73 MIL/uL (ref 3.87–5.11)
RDW: 13.4 % (ref 11.5–15.5)
WBC: 8.9 10*3/uL (ref 4.0–10.5)

## 2014-08-29 LAB — CBG MONITORING, ED: Glucose-Capillary: 252 mg/dL — ABNORMAL HIGH (ref 65–99)

## 2014-08-29 LAB — LIPASE, BLOOD: LIPASE: 22 U/L (ref 22–51)

## 2014-08-29 LAB — PREGNANCY, URINE: PREG TEST UR: NEGATIVE

## 2014-08-29 MED ORDER — IOHEXOL 300 MG/ML  SOLN
50.0000 mL | Freq: Once | INTRAMUSCULAR | Status: AC | PRN
  Administered 2014-08-29: 50 mL via ORAL

## 2014-08-29 MED ORDER — SODIUM CHLORIDE 0.9 % IV SOLN
INTRAVENOUS | Status: DC
  Administered 2014-08-29: 13:00:00 via INTRAVENOUS

## 2014-08-29 MED ORDER — ONDANSETRON HCL 4 MG/2ML IJ SOLN
4.0000 mg | Freq: Once | INTRAMUSCULAR | Status: AC
  Administered 2014-08-29: 4 mg via INTRAVENOUS
  Filled 2014-08-29: qty 2

## 2014-08-29 MED ORDER — HYDROMORPHONE HCL 1 MG/ML IJ SOLN
1.0000 mg | Freq: Once | INTRAMUSCULAR | Status: AC
  Administered 2014-08-29: 1 mg via INTRAVENOUS
  Filled 2014-08-29: qty 1

## 2014-08-29 MED ORDER — METRONIDAZOLE 500 MG PO TABS: 500.0000 mg | ORAL_TABLET | Freq: Two times a day (BID) | ORAL | Status: DC

## 2014-08-29 MED ORDER — IOHEXOL 300 MG/ML  SOLN
100.0000 mL | Freq: Once | INTRAMUSCULAR | Status: AC | PRN
  Administered 2014-08-29: 100 mL via INTRAVENOUS

## 2014-08-29 NOTE — ED Notes (Signed)
Not tolerating any POs

## 2014-08-29 NOTE — ED Notes (Signed)
MD at bedside. 

## 2014-08-29 NOTE — ED Provider Notes (Signed)
CSN: [id number]     Arrival date & time 08/29/14  1134 History   First MD Initiated Contact with Patient 08/29/14 1212     Chief Complaint  Patient presents with  . Flank Pain  . Pain     (Consider location/radiation/quality/duration/timing/severity/associated sxs/prior Treatment) HPI Comments: Patient here complaining of right flank pain radiating to her right lower quadrant 24 hours. Seen by her physician yesterday and diagnosed with having y[place] infection. Denies any fever or chills. No vomiting or diarrhea. Denies any hematuria or dysuria. Pain characterized as sharp and worse with movement. Denies any rashes or flank area. No vaginal bleeding but has had some discharge. States that her primary care doctor did not do a pelvic exam yesterday  Patient is a 39 y.o. female presenting with flank pain. The history is provided by the patient.  Flank Pain    Past Medical History  Diagnosis Date  . Diabetes mellitus without complication   . Hypertension   . Sleep apnea    Past Surgical History  Procedure Laterality Date  . Tonsillectomy     History reviewed. No pertinent family history. Social History  Substance Use Topics  . Smoking status: Former Smoker -- 0.50 packs/day    Types: Cigarettes  . Smokeless tobacco: None  . Alcohol Use: No   OB History    No data available     Review of Systems  Genitourinary: Positive for flank pain.  All other systems reviewed and are negative.     Allergies  Metformin and related and Mobic  Home Medications   Prior to Admission medications   Medication Sig Start Date End Date Taking? Authorizing Provider  insulin lispro protamine-lispro (HUMALOG 75/25 MIX) (75-25) 100 UNIT/ML SUSP injection Inject 30 Units into the skin daily with breakfast.   Yes Historical Provider, MD  insulin lispro protamine-lispro (HUMALOG 75/25 MIX) (75-25) 100 UNIT/ML SUSP injection Inject 15 Units into the skin daily with supper.   Yes Historical  Provider, MD  oxyCODONE-acetaminophen (PERCOCET) 10-325 MG per tablet Take 1 tablet by mouth every 6 (six) hours as needed for pain. 03/09/14  Yes Tilden Fossa, MD  pantoprazole (PROTONIX) 20 MG tablet Take 1 tablet (20 mg total) by mouth daily. 02/23/14  Yes Lorre Nick, MD  traZODone (DESYREL) 150 MG tablet Take 150 mg by mouth at bedtime.   Yes Historical Provider, MD  hydrochlorothiazide (HYDRODIURIL) 25 MG tablet Take 25 mg by mouth daily.    Historical Provider, MD  sucralfate (CARAFATE) 1 G tablet Take 1 tablet (1 g total) by mouth 4 (four) times daily. 02/23/14   Lorre Nick, MD   BP 143/73 mmHg  Temp(Src) 97.9 F (36.6 C) (Oral)  Resp 18  Ht  (1.753 m)  Wt 249 lb (112.946 kg)  BMI 36.75 kg/m2  SpO2 100%  LMP 08/28/2012 Physical Exam  Constitutional: She is oriented to person, place, and time. She appears well-developed and well-nourished.  Non-toxic appearance. No distress.  HENT:  Head: Normocephalic and atraumatic.  Eyes: Conjunctivae, EOM and lids are normal. Pupils are equal, round, and reactive to light.  Neck: Normal range of motion. Neck supple. No tracheal deviation present. No thyroid mass present.  Cardiovascular: Normal rate, regular rhythm and normal heart sounds.  Exam reveals no gallop.   No murmur heard. Pulmonary/Chest: Effort normal and breath sounds normal. No stridor. No respiratory distress. She has no decreased breath sounds. She has no wheezes. She has no rhonchi. She has no rales.  Abdominal: Soft.  Normal appearance and bowel sounds are normal. She exhibits no distension. There is tenderness in the right lower quadrant. There is no rigidity, no rebound, no guarding and no CVA tenderness.    Genitourinary: There is no rash on the right labia. There is no rash on the left labia. Vaginal discharge found.  Musculoskeletal: Normal range of motion. She exhibits no edema or tenderness.  Neurological: She is alert and oriented to person, place, and time.  She has normal strength. No cranial nerve deficit or sensory deficit. GCS eye subscore is 4. GCS verbal subscore is 5. GCS motor subscore is 6.  Skin: Skin is warm and dry. No abrasion and no rash noted.  Psychiatric: She has a normal mood and affect. Her speech is normal and behavior is normal.  Nursing note and vitals reviewed.   ED Course  Procedures (including critical care time) Labs Review Labs Reviewed  CBG MONITORING, ED - Abnormal; Notable for the following:    Glucose-Capillary 252 (*)    All other components within normal limits  PREGNANCY, URINE  URINALYSIS, ROUTINE W REFLEX MICROSCOPIC (NOT AT [facility])  CBC WITH DIFFERENTIAL/PLATELET  COMPREHENSIVE METABOLIC PANEL  LIPASE, BLOOD    Imaging Review No results found. I, Toy Baker, personally reviewed and evaluated these images and lab results as part of my medical decision-making.   EKG Interpretation None      MDM   Final diagnoses:  None    Patient given pain meds and feels better. Patient to be treated for bacterial vaginosis. She is receiving treatment for her yeast infection    Lorre Nick, MD 08/29/14 1451

## 2014-08-29 NOTE — ED Notes (Signed)
Per pt report seen primary care yesterday was given diflucan, yet symptoms increase to rt flank, burning upon urination.

## 2014-08-29 NOTE — ED Notes (Signed)
Presents with rt flank pain, radiates to abdomen x 24 hours, worse this am

## 2014-08-29 NOTE — Discharge Instructions (Signed)
Abdominal Pain, Women °Abdominal (stomach, pelvic, or belly) pain can be caused by many things. It is important to tell your doctor: °· The location of the pain. °· Does it come and go or is it present all the time? °· Are there things that start the pain (eating certain foods, exercise)? °· Are there other symptoms associated with the pain (fever, nausea, vomiting, diarrhea)? °All of this is helpful to know when trying to find the cause of the pain. °CAUSES  °· Stomach: virus or bacteria infection, or ulcer. °· Intestine: appendicitis (inflamed appendix), regional ileitis (Crohn's disease), ulcerative colitis (inflamed colon), irritable bowel syndrome, diverticulitis (inflamed diverticulum of the colon), or cancer of the stomach or intestine. °· Gallbladder disease or stones in the gallbladder. °· Kidney disease, kidney stones, or infection. °· Pancreas infection or cancer. °· Fibromyalgia (pain disorder). °· Diseases of the female organs: °· Uterus: fibroid (non-cancerous) tumors or infection. °· Fallopian tubes: infection or tubal pregnancy. °· Ovary: cysts or tumors. °· Pelvic adhesions (scar tissue). °· Endometriosis (uterus lining tissue growing in the pelvis and on the pelvic organs). °· Pelvic congestion syndrome (female organs filling up with blood just before the menstrual period). °· Pain with the menstrual period. °· Pain with ovulation (producing an egg). °· Pain with an IUD (intrauterine device, birth control) in the uterus. °· Cancer of the female organs. °· Functional pain (pain not caused by a disease, may improve without treatment). °· Psychological pain. °· Depression. °DIAGNOSIS  °Your doctor will decide the seriousness of your pain by doing an examination. °· Blood tests. °· X-rays. °· Ultrasound. °· CT scan (computed tomography, special type of X-ray). °· MRI (magnetic resonance imaging). °· Cultures, for infection. °· Barium enema (dye inserted in the large intestine, to better view it with  X-rays). °· Colonoscopy (looking in intestine with a lighted tube). °· Laparoscopy (minor surgery, looking in abdomen with a lighted tube). °· Major abdominal exploratory surgery (looking in abdomen with a large incision). °TREATMENT  °The treatment will depend on the cause of the pain.  °· Many cases can be observed and treated at home. °· Over-the-counter medicines recommended by your caregiver. °· Prescription medicine. °· Antibiotics, for infection. °· Birth control pills, for painful periods or for ovulation pain. °· Hormone treatment, for endometriosis. °· Nerve blocking injections. °· Physical therapy. °· Antidepressants. °· Counseling with a psychologist or psychiatrist. °· Minor or major surgery. °HOME CARE INSTRUCTIONS  °· Do not take laxatives, unless directed by your caregiver. °· Take over-the-counter pain medicine only if ordered by your caregiver. Do not take aspirin because it can cause an upset stomach or bleeding. °· Try a clear liquid diet (broth or water) as ordered by your caregiver. Slowly move to a bland diet, as tolerated, if the pain is related to the stomach or intestine. °· Have a thermometer and take your temperature several times a day, and record it. °· Bed rest and sleep, if it helps the pain. °· Avoid sexual intercourse, if it causes pain. °· Avoid stressful situations. °· Keep your follow-up appointments and tests, as your caregiver orders. °· If the pain does not go away with medicine or surgery, you may try: °· Acupuncture. °· Relaxation exercises (yoga, meditation). °· Group therapy. °· Counseling. °SEEK MEDICAL CARE IF:  °· You notice certain foods cause stomach pain. °· Your home care treatment is not helping your pain. °· You need stronger pain medicine. °· You want your IUD removed. °· You feel faint or   lightheaded. °· You develop nausea and vomiting. °· You develop a rash. °· You are having side effects or an allergy to your medicine. °SEEK IMMEDIATE MEDICAL CARE IF:  °· Your  pain does not go away or gets worse. °· You have a fever. °· Your pain is felt only in portions of the abdomen. The right side could possibly be appendicitis. The left lower portion of the abdomen could be colitis or diverticulitis. °· You are passing blood in your stools (bright red or black tarry stools, with or without vomiting). °· You have blood in your urine. °· You develop chills, with or without a fever. °· You pass out. °MAKE SURE YOU:  °· Understand these instructions. °· Will watch your condition. °· Will get help right away if you are not doing well or get worse. °Document Released: 10/29/2006 Document Revised: 05/18/2013 Document Reviewed: 11/18/2008 °ExitCare® Patient Information ©2015 [employer]. This information is not intended to replace advice given to you by your health care provider. Make sure you discuss any questions you have with your health care provider. °Bacterial Vaginosis °Bacterial vaginosis is a vaginal infection that occurs when the normal balance of bacteria in the vagina is disrupted. It results from an overgrowth of certain bacteria. This is the most common vaginal infection in women of childbearing age. Treatment is important to prevent complications, especially in pregnant women, as it can cause a premature delivery. °CAUSES  °Bacterial vaginosis is caused by an increase in harmful bacteria that are normally present in smaller amounts in the vagina. Several different kinds of bacteria can cause bacterial vaginosis. However, the reason that the condition develops is not fully understood. °RISK FACTORS °Certain activities or behaviors can put you at an increased risk of developing bacterial vaginosis, including: °· Having a new sex partner or multiple sex partners. °· Douching. °· Using an intrauterine device (IUD) for contraception. °Women do not get bacterial vaginosis from toilet seats, bedding, swimming pools, or contact with objects around them. °SIGNS AND SYMPTOMS  °Some  women with bacterial vaginosis have no signs or symptoms. Common symptoms include: °· Grey vaginal discharge. °· A fishlike odor with discharge, especially after sexual intercourse. °· Itching or burning of the vagina and vulva. °· Burning or pain with urination. °DIAGNOSIS  °Your health care provider will take a medical history and examine the vagina for signs of bacterial vaginosis. A sample of vaginal fluid may be taken. Your health care provider will look at this sample under a microscope to check for bacteria and abnormal cells. A vaginal pH test may also be done.  °TREATMENT  °Bacterial vaginosis may be treated with antibiotic medicines. These may be given in the form of a pill or a vaginal cream. A second round of antibiotics may be prescribed if the condition comes back after treatment.  °HOME CARE INSTRUCTIONS  °· Only take over-the-counter or prescription medicines as directed by your health care provider. °· If antibiotic medicine was prescribed, take it as directed. Make sure you finish it even if you start to feel better. °· Do not have sex until treatment is completed. °· Tell all sexual partners that you have a vaginal infection. They should see their health care provider and be treated if they have problems, such as a mild rash or itching. °· Practice safe sex by using condoms and only having one sex partner. °SEEK MEDICAL CARE IF:  °· Your symptoms are not improving after 3 days of treatment. °· You have increased discharge   or pain. °· You have a fever. °MAKE SURE YOU:  °· Understand these instructions. °· Will watch your condition. °· Will get help right away if you are not doing well or get worse. °FOR MORE INFORMATION  °Centers for Disease Control and Prevention, Division of STD Prevention: www.cdc.gov/std °American Sexual Health Association (ASHA): www.ashastd.org  °Document Released: 01/01/2005 Document Revised: 10/22/2012 Document Reviewed: 08/13/2012 °ExitCare® Patient Information ©2015  [employer]. This information is not intended to replace advice given to you by your health care provider. Make sure you discuss any questions you have with your health care provider. ° °

## 2014-08-29 NOTE — ED Notes (Signed)
Pt closing eyes, resting quietly , family at side

## 2014-08-31 LAB — GC/CHLAMYDIA PROBE AMP (~~LOC~~) NOT AT ARMC
Chlamydia: NEGATIVE
NEISSERIA GONORRHEA: NEGATIVE

## 2014-10-04 ENCOUNTER — Emergency Department (HOSPITAL_BASED_OUTPATIENT_CLINIC_OR_DEPARTMENT_OTHER)
Admission: EM | Admit: 2014-10-04 | Discharge: 2014-10-04 | Disposition: A | Discharge status: Home / Self Care | Payer: Medicare HMO | Attending: Emergency Medicine | Admitting: Emergency Medicine

## 2014-10-04 ENCOUNTER — Encounter (HOSPITAL_BASED_OUTPATIENT_CLINIC_OR_DEPARTMENT_OTHER): Payer: Self-pay | Admitting: *Deleted

## 2014-10-04 DIAGNOSIS — Z87891 Personal history of nicotine dependence: Secondary | ICD-10-CM | POA: Insufficient documentation

## 2014-10-04 DIAGNOSIS — Z79899 Other long term (current) drug therapy: Secondary | ICD-10-CM | POA: Diagnosis not present

## 2014-10-04 DIAGNOSIS — L03111 Cellulitis of right axilla: Secondary | ICD-10-CM | POA: Diagnosis not present

## 2014-10-04 DIAGNOSIS — Z8669 Personal history of other diseases of the nervous system and sense organs: Secondary | ICD-10-CM | POA: Diagnosis not present

## 2014-10-04 DIAGNOSIS — R21 Rash and other nonspecific skin eruption: Secondary | ICD-10-CM | POA: Diagnosis present

## 2014-10-04 DIAGNOSIS — I10 Essential (primary) hypertension: Secondary | ICD-10-CM | POA: Diagnosis not present

## 2014-10-04 DIAGNOSIS — E119 Type 2 diabetes mellitus without complications: Secondary | ICD-10-CM | POA: Insufficient documentation

## 2014-10-04 DIAGNOSIS — Z794 Long term (current) use of insulin: Secondary | ICD-10-CM | POA: Insufficient documentation

## 2014-10-04 MED ORDER — SULFAMETHOXAZOLE-TRIMETHOPRIM 800-160 MG PO TABS: 1.0000 | ORAL_TABLET | Freq: Two times a day (BID) | ORAL | Status: AC

## 2014-10-04 MED ORDER — CEPHALEXIN 500 MG PO CAPS: 500.0000 mg | ORAL_CAPSULE | Freq: Four times a day (QID) | ORAL | Status: DC

## 2014-10-04 NOTE — ED Notes (Signed)
Pt c/o rash under right arm since yesterday.

## 2014-10-04 NOTE — ED Notes (Signed)
Pt is in room 12 visiting. States pt in room 12 is her cousin.

## 2014-10-04 NOTE — Discharge Instructions (Signed)

## 2014-10-04 NOTE — ED Provider Notes (Signed)
CSN: [id number]     Arrival date & time 10/04/14  1000 History   First MD Initiated Contact with Patient 10/04/14 1024     Chief Complaint  Patient presents with  . Rash     (Consider location/radiation/quality/duration/timing/severity/associated sxs/prior Treatment) Patient is a 39 y.o. female presenting with rash. The history is provided by the patient.  Rash Associated symptoms: no fever and no shortness of breath    patient with a painful rash over right axilla. States she has had an abscess here in the past. No fevers. Worse with movements. States she used someone else's children thinks this may have had some effect on this. No fevers. No drainage. She states she is diabetic.  Past Medical History  Diagnosis Date  . Diabetes mellitus without complication   . Hypertension   . Sleep apnea    Past Surgical History  Procedure Laterality Date  . Tonsillectomy     No family history on file. Social History  Substance Use Topics  . Smoking status: Former Smoker -- 0.50 packs/day    Types: Cigarettes  . Smokeless tobacco: None  . Alcohol Use: No   OB History    No data available     Review of Systems  Constitutional: Negative for fever and chills.  Respiratory: Negative for shortness of breath.   Musculoskeletal: Negative for joint swelling.  Skin: Positive for color change and rash.      Allergies  Metformin and related and Mobic  Home Medications   Prior to Admission medications   Medication Sig Start Date End Date Taking? Authorizing Provider  cephALEXin (KEFLEX) 500 MG capsule Take 1 capsule (500 mg total) by mouth 4 (four) times daily. 10/04/14   Benjiman Core, MD  hydrochlorothiazide (HYDRODIURIL) 25 MG tablet Take 25 mg by mouth daily.    Historical Provider, MD  insulin lispro protamine-lispro (HUMALOG 75/25 MIX) (75-25) 100 UNIT/ML SUSP injection Inject 30 Units into the skin daily with breakfast.    Historical Provider, MD  insulin lispro  protamine-lispro (HUMALOG 75/25 MIX) (75-25) 100 UNIT/ML SUSP injection Inject 15 Units into the skin daily with supper.    Historical Provider, MD  metroNIDAZOLE (FLAGYL) 500 MG tablet Take 1 tablet (500 mg total) by mouth 2 (two) times daily. 08/29/14   Lorre Nick, MD  oxyCODONE-acetaminophen (PERCOCET) 10-325 MG per tablet Take 1 tablet by mouth every 6 (six) hours as needed for pain. 03/09/14   Tilden Fossa, MD  pantoprazole (PROTONIX) 20 MG tablet Take 1 tablet (20 mg total) by mouth daily. 02/23/14   Lorre Nick, MD  sucralfate (CARAFATE) 1 G tablet Take 1 tablet (1 g total) by mouth 4 (four) times daily. 02/23/14   Lorre Nick, MD  sulfamethoxazole-trimethoprim (BACTRIM DS,SEPTRA DS) 800-160 MG per tablet Take 1 tablet by mouth 2 (two) times daily. 10/04/14 10/11/14  Benjiman Core, MD  traZODone (DESYREL) 150 MG tablet Take 150 mg by mouth at bedtime.    Historical Provider, MD   BP 120/68 mmHg  Pulse 94  Temp(Src) 98.2 F (36.8 C) (Oral)  Resp 18  Ht  (1.753 m)  Wt 247 lb (112.038 kg)  BMI 36.46 kg/m2  SpO2 99% Physical Exam  Constitutional: She appears well-developed.  Cardiovascular: Normal rate.   Skin:  Erythema and right axilla. Some slight induration. No fluctuance.    ED Course  Procedures (including critical care time) Labs Review Labs Reviewed - No data to display  Imaging Review No results found. I have personally reviewed  and evaluated these images and lab results as part of my medical decision-making.   EKG Interpretation None      MDM   Final diagnoses:  Cellulitis of right axilla    Patient possible cellulitis    in right axilla. No abscess. Has had abscesses in the past. Will treat with antibiotics due to patient's diabetes and history of abscesses.   Benjiman Core, MD 10/04/14 [phone]

## 2014-11-30 ENCOUNTER — Encounter (HOSPITAL_BASED_OUTPATIENT_CLINIC_OR_DEPARTMENT_OTHER): Payer: Self-pay | Admitting: Emergency Medicine

## 2014-11-30 ENCOUNTER — Emergency Department (HOSPITAL_BASED_OUTPATIENT_CLINIC_OR_DEPARTMENT_OTHER)
Admission: EM | Admit: 2014-11-30 | Discharge: 2014-12-01 | Disposition: A | Discharge status: Home / Self Care | Payer: Medicare HMO | Attending: Emergency Medicine | Admitting: Emergency Medicine

## 2014-11-30 DIAGNOSIS — Z792 Long term (current) use of antibiotics: Secondary | ICD-10-CM | POA: Diagnosis not present

## 2014-11-30 DIAGNOSIS — Z87891 Personal history of nicotine dependence: Secondary | ICD-10-CM | POA: Diagnosis not present

## 2014-11-30 DIAGNOSIS — G44209 Tension-type headache, unspecified, not intractable: Secondary | ICD-10-CM

## 2014-11-30 DIAGNOSIS — E860 Dehydration: Secondary | ICD-10-CM | POA: Insufficient documentation

## 2014-11-30 DIAGNOSIS — E669 Obesity, unspecified: Secondary | ICD-10-CM | POA: Insufficient documentation

## 2014-11-30 DIAGNOSIS — G44201 Tension-type headache, unspecified, intractable: Secondary | ICD-10-CM | POA: Insufficient documentation

## 2014-11-30 DIAGNOSIS — E1165 Type 2 diabetes mellitus with hyperglycemia: Secondary | ICD-10-CM | POA: Insufficient documentation

## 2014-11-30 DIAGNOSIS — R739 Hyperglycemia, unspecified: Secondary | ICD-10-CM

## 2014-11-30 DIAGNOSIS — Z79899 Other long term (current) drug therapy: Secondary | ICD-10-CM | POA: Insufficient documentation

## 2014-11-30 DIAGNOSIS — Z794 Long term (current) use of insulin: Secondary | ICD-10-CM | POA: Diagnosis not present

## 2014-11-30 DIAGNOSIS — I10 Essential (primary) hypertension: Secondary | ICD-10-CM | POA: Insufficient documentation

## 2014-11-30 LAB — CBC WITH DIFFERENTIAL/PLATELET
BASOS ABS: 0.1 10*3/uL (ref 0.0–0.1)
Basophils Relative: 1 %
EOS PCT: 2 %
Eosinophils Absolute: 0.2 10*3/uL (ref 0.0–0.7)
HCT: 39.6 % (ref 36.0–46.0)
Hemoglobin: 12.9 g/dL (ref 12.0–15.0)
LYMPHS ABS: 3.9 10*3/uL (ref 0.7–4.0)
LYMPHS PCT: 38 %
MCH: 26.7 pg (ref 26.0–34.0)
MCHC: 32.6 g/dL (ref 30.0–36.0)
MCV: 81.8 fL (ref 78.0–100.0)
MONO ABS: 0.9 10*3/uL (ref 0.1–1.0)
Monocytes Relative: 9 %
Neutro Abs: 5.2 10*3/uL (ref 1.7–7.7)
Neutrophils Relative %: 50 %
PLATELETS: 358 10*3/uL (ref 150–400)
RBC: 4.84 MIL/uL (ref 3.87–5.11)
RDW: 13.6 % (ref 11.5–15.5)
WBC: 10.2 10*3/uL (ref 4.0–10.5)

## 2014-11-30 LAB — URINE MICROSCOPIC-ADD ON: WBC, UA: NONE SEEN WBC/hpf (ref 0–5)

## 2014-11-30 LAB — URINALYSIS, ROUTINE W REFLEX MICROSCOPIC
BILIRUBIN URINE: NEGATIVE
Glucose, UA: >1000 mg/dL — AB
Ketones, ur: NEGATIVE mg/dL
Leukocytes, UA: NEGATIVE
Nitrite: NEGATIVE
PH: 5.5 (ref 5.0–8.0)
PROTEIN: NEGATIVE mg/dL
Specific Gravity, Urine: 1.036 — ABNORMAL HIGH (ref 1.005–1.030)

## 2014-11-30 LAB — BASIC METABOLIC PANEL
ANION GAP: 5 (ref 5–15)
BUN: 16 mg/dL (ref 6–20)
CALCIUM: 8.9 mg/dL (ref 8.9–10.3)
CO2: 28 mmol/L (ref 22–32)
Chloride: 102 mmol/L (ref 101–111)
Creatinine, Ser: 0.64 mg/dL (ref 0.44–1.00)
GFR calc Af Amer: >60 mL/min (ref >60)
GLUCOSE: 247 mg/dL — AB (ref 65–99)
Potassium: 4.1 mmol/L (ref 3.5–5.1)
SODIUM: 135 mmol/L (ref 135–145)

## 2014-11-30 LAB — CBG MONITORING, ED: Glucose-Capillary: 277 mg/dL — ABNORMAL HIGH (ref 65–99)

## 2014-11-30 NOTE — ED Notes (Signed)
Nurse first-pt presented to ED-gave name for registration and states she needs to void prior to triage-to ED BR w/o distress

## 2014-11-30 NOTE — ED Notes (Signed)
Patient reports that she is having lethargy and irregular BS - the patient reports that she have times in the 400's. Patient states that she did a "fasting BS this am and it was 199"   Patient states that she feels very lightheaded and "passyouty"

## 2014-11-30 NOTE — ED Notes (Signed)
Patient in restroom x at least 20 minutes.

## 2014-12-01 DIAGNOSIS — E1165 Type 2 diabetes mellitus with hyperglycemia: Secondary | ICD-10-CM | POA: Diagnosis not present

## 2014-12-01 MED ORDER — DIPHENHYDRAMINE HCL 50 MG/ML IJ SOLN
25.0000 mg | Freq: Once | INTRAMUSCULAR | Status: AC
Start: 2014-12-01 — End: 2014-12-01
  Administered 2014-12-01: 25 mg via INTRAVENOUS

## 2014-12-01 MED ORDER — PROCHLORPERAZINE EDISYLATE 5 MG/ML IJ SOLN
INTRAMUSCULAR | Status: AC
  Filled 2014-12-01: qty 2

## 2014-12-01 MED ORDER — PROCHLORPERAZINE EDISYLATE 5 MG/ML IJ SOLN
10.0000 mg | Freq: Four times a day (QID) | INTRAMUSCULAR | Status: DC | PRN
  Administered 2014-12-01: 10 mg via INTRAVENOUS

## 2014-12-01 MED ORDER — DIPHENHYDRAMINE HCL 50 MG/ML IJ SOLN
INTRAMUSCULAR | Status: AC
  Filled 2014-12-01: qty 1

## 2014-12-01 MED ORDER — SODIUM CHLORIDE 0.9 % IV BOLUS (SEPSIS)
1000.0000 mL | Freq: Once | INTRAVENOUS | Status: AC
  Administered 2014-12-01: 1000 mL via INTRAVENOUS

## 2014-12-01 NOTE — ED Provider Notes (Signed)
CSN: [id number]     Arrival date & time 11/30/14  2133 History   First MD Initiated Contact with Patient 11/30/14 2256     Chief Complaint  Patient presents with  . Hyperglycemia     (Consider location/radiation/quality/duration/timing/severity/associated sxs/prior Treatment) HPI  This is a 39 year old female with history of diabetes, hypertension, and sleep apnea who presents with lightheadedness and near syncope. Also reports headache. Patient reports that she's had a very stressful week. She has a history of diabetes and her blood sugars have been running in the 200s. At times there is high as 400. She reports decreased by mouth intake this week. She reports lightheadedness with standing and near syncope. She reports an occipital headache that radiates into her neck.  She has not taken anything at home for her headache. Current pain is 10 out of 10. She denies any vision changes, weakness, numbness, or tingling. She denies any chest pain, shortness breath, fevers.  Past Medical History  Diagnosis Date  . Diabetes mellitus without complication (HCC)   . Hypertension   . Sleep apnea    Past Surgical History  Procedure Laterality Date  . Tonsillectomy     History reviewed. No pertinent family history. Social History  Substance Use Topics  . Smoking status: Former Smoker -- 0.50 packs/day    Types: Cigarettes  . Smokeless tobacco: None  . Alcohol Use: No   OB History    No data available     Review of Systems  Constitutional: Negative for fever.  Eyes: Negative for photophobia.  Respiratory: Negative for chest tightness and shortness of breath.   Cardiovascular: Negative for chest pain.  Gastrointestinal: Negative for nausea, vomiting and abdominal pain.  Genitourinary: Negative for dysuria.  Musculoskeletal: Negative for neck pain.  Neurological: Positive for light-headedness and headaches. Negative for weakness.  Psychiatric/Behavioral: Negative for confusion.  All  other systems reviewed and are negative.     Allergies  Metformin and related and Mobic  Home Medications   Prior to Admission medications   Medication Sig Start Date End Date Taking? Authorizing Provider  cephALEXin (KEFLEX) 500 MG capsule Take 1 capsule (500 mg total) by mouth 4 (four) times daily. 10/04/14   Benjiman Core, MD  hydrochlorothiazide (HYDRODIURIL) 25 MG tablet Take 25 mg by mouth daily.    Historical Provider, MD  insulin lispro protamine-lispro (HUMALOG 75/25 MIX) (75-25) 100 UNIT/ML SUSP injection Inject 30 Units into the skin daily with breakfast.    Historical Provider, MD  insulin lispro protamine-lispro (HUMALOG 75/25 MIX) (75-25) 100 UNIT/ML SUSP injection Inject 15 Units into the skin daily with supper.    Historical Provider, MD  metroNIDAZOLE (FLAGYL) 500 MG tablet Take 1 tablet (500 mg total) by mouth 2 (two) times daily. 08/29/14   Lorre Nick, MD  oxyCODONE-acetaminophen (PERCOCET) 10-325 MG per tablet Take 1 tablet by mouth every 6 (six) hours as needed for pain. 03/09/14   Tilden Fossa, MD  pantoprazole (PROTONIX) 20 MG tablet Take 1 tablet (20 mg total) by mouth daily. 02/23/14   Lorre Nick, MD  sucralfate (CARAFATE) 1 G tablet Take 1 tablet (1 g total) by mouth 4 (four) times daily. 02/23/14   Lorre Nick, MD  traZODone (DESYREL) 150 MG tablet Take 150 mg by mouth at bedtime.    Historical Provider, MD   BP 142/88 mmHg  Pulse 98  Temp(Src) 98.3 F (36.8 C) (Oral)  Resp 18  Ht  (1.753 m)  Wt 250 lb (113.399 kg)  BMI  36.90 kg/m2  SpO2 100% Physical Exam  Constitutional: She is oriented to person, place, and time. She appears well-developed and well-nourished. No distress.  Obese  HENT:  Head: Normocephalic and atraumatic.  Mouth/Throat: Oropharynx is clear and moist.  Eyes: EOM are normal. Pupils are equal, round, and reactive to light.  Neck: Normal range of motion. Neck supple.  Cardiovascular: Normal rate, regular rhythm and normal  heart sounds.   No murmur heard. Pulmonary/Chest: Effort normal and breath sounds normal. No respiratory distress. She has no wheezes.  Abdominal: Soft. Bowel sounds are normal. There is no tenderness. There is no rebound.  Neurological: She is alert and oriented to person, place, and time.  Cranial nerves II through XII intact, normal gait  Skin: Skin is warm and dry.  Psychiatric: She has a normal mood and affect.  Nursing note and vitals reviewed.   ED Course  Procedures (including critical care time) Labs Review Labs Reviewed  URINALYSIS, ROUTINE W REFLEX MICROSCOPIC (NOT AT [facility]) - Abnormal; Notable for the following:    Specific Gravity, Urine 1.036 (*)    Glucose, UA >1000 (*)    Hgb urine dipstick TRACE (*)    All other components within normal limits  URINE MICROSCOPIC-ADD ON - Abnormal; Notable for the following:    Squamous Epithelial / LPF 0-5 (*)    Bacteria, UA RARE (*)    All other components within normal limits  BASIC METABOLIC PANEL - Abnormal; Notable for the following:    Glucose, Bld 247 (*)    All other components within normal limits  CBG MONITORING, ED - Abnormal; Notable for the following:    Glucose-Capillary 277 (*)    All other components within normal limits  CBC WITH DIFFERENTIAL/PLATELET    Imaging Review No results found. I have personally reviewed and evaluated these images and lab results as part of my medical decision-making.   EKG Interpretation   Date/Time:  Wednesday December 01 2014 01:02:13 EST Ventricular Rate:  93 PR Interval:  170 QRS Duration: 76 QT Interval:  368 QTC Calculation: 457 R Axis:   95 Text Interpretation:  Normal sinus rhythm Rightward axis Abnormal ECG  Confirmed by HORTON  MD, COURTNEY ([id number]) on 12/01/2014 2:08:05 AM      MDM   Final diagnoses:  Hyperglycemia  Dehydration  Tension-type headache, not intractable, unspecified chronicity pattern    Patient presents with lightheadedness, near syncope,  headache, and hyperglycemia. Nontoxic on exam. Vital signs are reassuring. Neurologically intact. Patient reports decreased by mouth intake. Symptoms could be related to dehydration. She also reports increased stress. No red flag features of her headache.  Patient was given a migraine cocktail. Basic labwork obtained and reassuring. She is hyperglycemic without an anion gap. Patient was given fluids.  Not technically orthostatic. On recheck, patient is ambulating independently. She reports improvement of symptoms. She is no longer lightheaded. Discussed with patient importance of hydration at home and monitoring her blood sugars closely. Patient stated understanding.  After history, exam, and medical workup I feel the patient has been appropriately medically screened and is safe for discharge home. Pertinent diagnoses were discussed with the patient. Patient was given return precautions.   Shon Batonourtney F Horton, MD 12/01/14 [phone]

## 2014-12-01 NOTE — Discharge Instructions (Signed)
Tension Headache °A tension headache is a feeling of pain, pressure, or aching that is often felt over the front and sides of the head. The pain can be dull, or it can feel tight (constricting). Tension headaches are not normally associated with nausea or vomiting, and they do not get worse with physical activity. Tension headaches can last from 30 minutes to several days. This is the most common type of headache. °CAUSES °The exact cause of this condition is not known. Tension headaches often begin after stress, anxiety, or depression. Other triggers may include: °· Alcohol. °· Too much caffeine, or caffeine withdrawal. °· Respiratory infections, such as colds, flu, or sinus infections. °· Dental problems or teeth clenching. °· Fatigue. °· Holding your head and neck in the same position for a long period of time, such as while using a computer. °· Smoking. °SYMPTOMS °Symptoms of this condition include: °· A feeling of pressure around the head. °· Dull, aching head pain. °· Pain felt over the front and sides of the head. °· Tenderness in the muscles of the head, neck, and shoulders. °DIAGNOSIS °This condition may be diagnosed based on your symptoms and a physical exam. Tests may be done, such as a CT scan or an MRI of your head. These tests may be done if your symptoms are severe or unusual. °TREATMENT °This condition may be treated with lifestyle changes and medicines to help relieve symptoms. °HOME CARE INSTRUCTIONS °Managing Pain °· Take over-the-counter and prescription medicines only as told by your health care provider. °· Lie down in a dark, quiet room when you have a headache. °· If directed, apply ice to the head and neck area: °¨ Put ice in a plastic bag. °¨ Place a towel between your skin and the bag. °¨ Leave the ice on for 20 minutes, 2-3 times per day. °· Use a heating pad or a hot shower to apply heat to the head and neck area as told by your health care provider. °Eating and Drinking °· Eat meals on  a regular schedule. °· Limit alcohol use. °· Decrease your caffeine intake, or stop using caffeine. °General Instructions °· Keep all follow-up visits as told by your health care provider. This is important. °· Keep a headache journal to help find out what may trigger your headaches. For example, write down: °¨ What you eat and drink. °¨ How much sleep you get. °¨ Any change to your diet or medicines. °· Try massage or other relaxation techniques. °· Limit stress. °· Sit up straight, and avoid tensing your muscles. °· Do not use tobacco products, including cigarettes, chewing tobacco, or e-cigarettes. If you need help quitting, ask your health care provider. °· Exercise regularly as told by your health care provider. °· Get 7-9 hours of sleep, or the amount recommended by your health care provider. °SEEK MEDICAL CARE IF: °· Your symptoms are not helped by medicine. °· You have a headache that is different from what you normally experience. °· You have nausea or you vomit. °· You have a fever. °SEEK IMMEDIATE MEDICAL CARE IF: °· Your headache becomes severe. °· You have repeated vomiting. °· You have a stiff neck. °· You have a loss of vision. °· You have problems with speech. °· You have pain in your eye or ear. °· You have muscular weakness or loss of muscle control. °· You lose your balance or you have trouble walking. °· You feel faint or you pass out. °· You have confusion. °  °  This information is not intended to replace advice given to you by your health care provider. Make sure you discuss any questions you have with your health care provider.   Document Released: 01/01/2005 Document Revised: 09/22/2014 Document Reviewed: 04/26/2014 Elsevier Interactive Patient Education 2016 [employer] Dehydration, Adult Dehydration is a condition in which you do not have enough fluid or water in your body. It happens when you take in less fluid than you lose. Vital organs such as the kidneys, brain, and heart cannot  function without a proper amount of fluids. Any loss of fluids from the body can cause dehydration.  Dehydration can range from mild to severe. This condition should be treated right away to help prevent it from becoming severe. CAUSES  This condition may be caused by:  Vomiting.  Diarrhea.  Excessive sweating, such as when exercising in hot or humid weather.  Not drinking enough fluid during strenuous exercise or during an illness.  Excessive urine output.  Fever.  Certain medicines. RISK FACTORS This condition is more likely to develop in:  People who are taking certain medicines that cause the body to lose excess fluid (diuretics).   People who have a chronic illness, such as diabetes, that may increase urination.  Older adults.   People who live at high altitudes.   People who participate in endurance sports.  SYMPTOMS  Mild Dehydration  Thirst.  Dry lips.  Slightly dry mouth.  Dry, warm skin. Moderate Dehydration  Very dry mouth.   Muscle cramps.   Dark urine and decreased urine production.   Decreased tear production.   Headache.   Light-headedness, especially when you stand up from a sitting position.  Severe Dehydration  Changes in skin.   Cold and clammy skin.   Skin does not spring back quickly when lightly pinched and released.   Changes in body fluids.   Extreme thirst.   No tears.   Not able to sweat when body temperature is high, such as in hot weather.   Minimal urine production.   Changes in vital signs.   Rapid, weak pulse (more than 100 beats per minute when you are sitting still).   Rapid breathing.   Low blood pressure.   Other changes.   Sunken eyes.   Cold hands and feet.   Confusion.  Lethargy and difficulty being awakened.  Fainting (syncope).   Short-term weight loss.   Unconsciousness. DIAGNOSIS  This condition may be diagnosed based on your symptoms. You may also have  tests to determine how severe your dehydration is. These tests may include:   Urine tests.   Blood tests.  TREATMENT  Treatment for this condition depends on the severity. Mild or moderate dehydration can often be treated at home. Treatment should be started right away. Do not wait until dehydration becomes severe. Severe dehydration needs to be treated at the hospital. Treatment for Mild Dehydration  Drinking plenty of water to replace the fluid you have lost.   Replacing minerals in your blood (electrolytes) that you may have lost.  Treatment for Moderate Dehydration  Consuming oral rehydration solution (ORS). Treatment for Severe Dehydration  Receiving fluid through an IV tube.   Receiving electrolyte solution through a feeding tube that is passed through your nose and into your stomach (nasogastric tube or NG tube).  Correcting any abnormalities in electrolytes. HOME CARE INSTRUCTIONS   Drink enough fluid to keep your urine clear or pale yellow.   Drink water or fluid slowly by taking small  sips. You can also try sucking on ice cubes.  Have food or beverages that contain electrolytes. Examples include bananas and sports drinks.  Take over-the-counter and prescription medicines only as told by your health care provider.   Prepare ORS according to the manufacturer's instructions. Take sips of ORS every 5 minutes until your urine returns to normal.  If you have vomiting or diarrhea, continue to try to drink water, ORS, or both.   If you have diarrhea, avoid:   Beverages that contain caffeine.   Fruit juice.   Milk.   Carbonated soft drinks.  Do not take salt tablets. This can lead to the condition of having too much sodium in your body (hypernatremia).  SEEK MEDICAL CARE IF:  You cannot eat or drink without vomiting.  You have had moderate diarrhea during a period of more than 24 hours.  You have a fever. SEEK IMMEDIATE MEDICAL CARE IF:   You  have extreme thirst.  You have severe diarrhea.  You have not urinated in 6-8 hours, or you have urinated only a small amount of very dark urine.  You have shriveled skin.  You are dizzy, confused, or both.   This information is not intended to replace advice given to you by your health care provider. Make sure you discuss any questions you have with your health care provider.   Document Released: 01/01/2005 Document Revised: 09/22/2014 Document Reviewed: 05/19/2014 Elsevier Interactive Patient Education 2016 [employer] Hyperglycemia Hyperglycemia occurs when the glucose (sugar) in your blood is too high. Hyperglycemia can happen for many reasons, but it most often happens to people who do not know they have diabetes or are not managing their diabetes properly.  CAUSES  Whether you have diabetes or not, there are other causes of hyperglycemia. Hyperglycemia can occur when you have diabetes, but it can also occur in other situations that you might not be as aware of, such as: Diabetes  If you have diabetes and are having problems controlling your blood glucose, hyperglycemia could occur because of some of the following reasons:  Not following your meal plan.  Not taking your diabetes medications or not taking it properly.  Exercising less or doing less activity than you normally do.  Being sick. Pre-diabetes  This cannot be ignored. Before people develop Type 2 diabetes, they almost always have "pre-diabetes." This is when your blood glucose levels are higher than normal, but not yet high enough to be diagnosed as diabetes. Research has shown that some long-term damage to the body, especially the heart and circulatory system, may already be occurring during pre-diabetes. If you take action to manage your blood glucose when you have pre-diabetes, you may delay or prevent Type 2 diabetes from developing. Stress  If you have diabetes, you may be "diet" controlled or on oral  medications or insulin to control your diabetes. However, you may find that your blood glucose is higher than usual in the hospital whether you have diabetes or not. This is often referred to as "stress hyperglycemia." Stress can elevate your blood glucose. This happens because of hormones put out by the body during times of stress. If stress has been the cause of your high blood glucose, it can be followed regularly by your caregiver. That way he/she can make sure your hyperglycemia does not continue to get worse or progress to diabetes. Steroids  Steroids are medications that act on the infection fighting system (immune system) to block inflammation or infection. One side effect can  be a rise in blood glucose. Most people can produce enough extra insulin to allow for this rise, but for those who cannot, steroids make blood glucose levels go even higher. It is not unusual for steroid treatments to "uncover" diabetes that is developing. It is not always possible to determine if the hyperglycemia will go away after the steroids are stopped. A special blood test called an A1c is sometimes done to determine if your blood glucose was elevated before the steroids were started. SYMPTOMS  Thirsty.  Frequent urination.  Dry mouth.  Blurred vision.  Tired or fatigue.  Weakness.  Sleepy.  Tingling in feet or leg. DIAGNOSIS  Diagnosis is made by monitoring blood glucose in one or all of the following ways:  A1c test. This is a chemical found in your blood.  Fingerstick blood glucose monitoring.  Laboratory results. TREATMENT  First, knowing the cause of the hyperglycemia is important before the hyperglycemia can be treated. Treatment may include, but is not be limited to:  Education.  Change or adjustment in medications.  Change or adjustment in meal plan.  Treatment for an illness, infection, etc.  More frequent blood glucose monitoring.  Change in exercise plan.  Decreasing or  stopping steroids.  Lifestyle changes. HOME CARE INSTRUCTIONS   Test your blood glucose as directed.  Exercise regularly. Your caregiver will give you instructions about exercise. Pre-diabetes or diabetes which comes on with stress is helped by exercising.  Eat wholesome, balanced meals. Eat often and at regular, fixed times. Your caregiver or nutritionist will give you a meal plan to guide your sugar intake.  Being at an ideal weight is important. If needed, losing as little as 10 to 15 pounds may help improve blood glucose levels. SEEK MEDICAL CARE IF:   You have questions about medicine, activity, or diet.  You continue to have symptoms (problems such as increased thirst, urination, or weight gain). SEEK IMMEDIATE MEDICAL CARE IF:   You are vomiting or have diarrhea.  Your breath smells fruity.  You are breathing faster or slower.  You are very sleepy or incoherent.  You have numbness, tingling, or pain in your feet or hands.  You have chest pain.  Your symptoms get worse even though you have been following your caregiver's orders.  If you have any other questions or concerns.   This information is not intended to replace advice given to you by your health care provider. Make sure you discuss any questions you have with your health care provider.   Document Released: 06/27/2000 Document Revised: 03/26/2011 Document Reviewed: 09/07/2014 Elsevier Interactive Patient Education [employer]

## 2015-02-26 ENCOUNTER — Emergency Department (HOSPITAL_BASED_OUTPATIENT_CLINIC_OR_DEPARTMENT_OTHER)
Admission: EM | Admit: 2015-02-26 | Discharge: 2015-02-26 | Disposition: A | Discharge status: Home / Self Care | Payer: Medicare HMO | Attending: Emergency Medicine | Admitting: Emergency Medicine

## 2015-02-26 ENCOUNTER — Encounter (HOSPITAL_BASED_OUTPATIENT_CLINIC_OR_DEPARTMENT_OTHER): Payer: Self-pay | Admitting: *Deleted

## 2015-02-26 DIAGNOSIS — R350 Frequency of micturition: Secondary | ICD-10-CM | POA: Diagnosis not present

## 2015-02-26 DIAGNOSIS — M545 Low back pain: Secondary | ICD-10-CM

## 2015-02-26 DIAGNOSIS — Z8669 Personal history of other diseases of the nervous system and sense organs: Secondary | ICD-10-CM | POA: Diagnosis not present

## 2015-02-26 DIAGNOSIS — Z3202 Encounter for pregnancy test, result negative: Secondary | ICD-10-CM | POA: Insufficient documentation

## 2015-02-26 DIAGNOSIS — Z79899 Other long term (current) drug therapy: Secondary | ICD-10-CM | POA: Insufficient documentation

## 2015-02-26 DIAGNOSIS — R3915 Urgency of urination: Secondary | ICD-10-CM | POA: Insufficient documentation

## 2015-02-26 DIAGNOSIS — Z794 Long term (current) use of insulin: Secondary | ICD-10-CM | POA: Insufficient documentation

## 2015-02-26 DIAGNOSIS — R300 Dysuria: Secondary | ICD-10-CM | POA: Insufficient documentation

## 2015-02-26 DIAGNOSIS — Z8619 Personal history of other infectious and parasitic diseases: Secondary | ICD-10-CM | POA: Insufficient documentation

## 2015-02-26 DIAGNOSIS — E119 Type 2 diabetes mellitus without complications: Secondary | ICD-10-CM | POA: Insufficient documentation

## 2015-02-26 DIAGNOSIS — Z87891 Personal history of nicotine dependence: Secondary | ICD-10-CM | POA: Diagnosis not present

## 2015-02-26 DIAGNOSIS — I10 Essential (primary) hypertension: Secondary | ICD-10-CM | POA: Insufficient documentation

## 2015-02-26 LAB — URINE MICROSCOPIC-ADD ON: WBC UA: NONE SEEN WBC/hpf (ref 0–5)

## 2015-02-26 LAB — URINALYSIS, ROUTINE W REFLEX MICROSCOPIC
Bilirubin Urine: NEGATIVE
Ketones, ur: NEGATIVE mg/dL
LEUKOCYTES UA: NEGATIVE
Nitrite: NEGATIVE
Protein, ur: NEGATIVE mg/dL
SPECIFIC GRAVITY, URINE: 1.036 — AB (ref 1.005–1.030)
pH: 6.5 (ref 5.0–8.0)

## 2015-02-26 LAB — BASIC METABOLIC PANEL
ANION GAP: 7 (ref 5–15)
BUN: 16 mg/dL (ref 6–20)
CALCIUM: 8.8 mg/dL — AB (ref 8.9–10.3)
CO2: 25 mmol/L (ref 22–32)
Chloride: 101 mmol/L (ref 101–111)
Creatinine, Ser: 0.53 mg/dL (ref 0.44–1.00)
GFR calc non Af Amer: >60 mL/min (ref >60)
GLUCOSE: 323 mg/dL — AB (ref 65–99)
POTASSIUM: 4.2 mmol/L (ref 3.5–5.1)
Sodium: 133 mmol/L — ABNORMAL LOW (ref 135–145)

## 2015-02-26 LAB — PREGNANCY, URINE: PREG TEST UR: NEGATIVE

## 2015-02-26 LAB — CBG MONITORING, ED: Glucose-Capillary: 360 mg/dL — ABNORMAL HIGH (ref 65–99)

## 2015-02-26 MED ORDER — SODIUM CHLORIDE 0.9 % IV BOLUS (SEPSIS)
1000.0000 mL | Freq: Once | INTRAVENOUS | Status: AC
  Administered 2015-02-26: 1000 mL via INTRAVENOUS

## 2015-02-26 MED ORDER — ACETAMINOPHEN 325 MG PO TABS: 650.0000 mg | ORAL_TABLET | Freq: Once | ORAL | Status: DC

## 2015-02-26 NOTE — ED Provider Notes (Signed)
CSN: [id number]     Arrival date & time 02/26/15  1420 History   First MD Initiated Contact with Patient 02/26/15 1438     Chief Complaint  Patient presents with  . Back Pain   Mlissa Noguera is a 40 y.o. female who presents to the emergency department complaining of acute on chronic low back pain worse in the past 3 days. She reports chronic low back pain, that has worsened. She has 10/10 left low back pain. She reports it does not radiate to her abdomen. She also complains of dysuria with urinary frequency and urgency for the past 1-2 weeks. Patient reports she is on antibiotics for an H. pylori infection. She is on her second week of treatment. The patient reports she took ibuprofen this morning without relief of her pain. The patient reports her pain is worse with movement and certain positions. The patient denies any fevers, falls, back injury, numbness, tingling, weakness, loss of bladder control, loss of bowel control, leg pain, leg swelling or rashes. Patient normally walks with a walker.   Patient is a 40 y.o. female presenting with back pain. The history is provided by the patient. No language interpreter was used.  Back Pain Associated symptoms: dysuria   Associated symptoms: no abdominal pain, no chest pain, no fever, no headaches, no numbness and no weakness     Past Medical History  Diagnosis Date  . Diabetes mellitus without complication (HCC)   . Hypertension   . Sleep apnea    Past Surgical History  Procedure Laterality Date  . Tonsillectomy     History reviewed. No pertinent family history. Social History  Substance Use Topics  . Smoking status: Former Smoker -- 0.50 packs/day    Types: Cigarettes  . Smokeless tobacco: None  . Alcohol Use: No   OB History    No data available     Review of Systems  Constitutional: Negative for fever and chills.  HENT: Negative for congestion and sore throat.   Eyes: Negative for visual disturbance.  Respiratory: Negative  for cough and shortness of breath.   Cardiovascular: Negative for chest pain.  Gastrointestinal: Negative for nausea, vomiting, abdominal pain and diarrhea.  Genitourinary: Positive for dysuria, urgency and frequency. Negative for hematuria, flank pain, decreased urine volume, vaginal bleeding, vaginal discharge and difficulty urinating.  Musculoskeletal: Positive for back pain. Negative for neck pain.  Skin: Negative for rash.  Neurological: Negative for weakness, light-headedness, numbness and headaches.      Allergies  Metformin and related and Mobic  Home Medications   Prior to Admission medications   Medication Sig Start Date End Date Taking? Authorizing Provider  losartan (COZAAR) 100 MG tablet Take 100 mg by mouth daily.   Yes Historical Provider, MD  OXYCODONE ER PO Take by mouth.   Yes Historical Provider, MD  hydrochlorothiazide (HYDRODIURIL) 25 MG tablet Take 25 mg by mouth daily.    Historical Provider, MD  insulin lispro protamine-lispro (HUMALOG 75/25 MIX) (75-25) 100 UNIT/ML SUSP injection Inject 30 Units into the skin daily with breakfast.    Historical Provider, MD  insulin lispro protamine-lispro (HUMALOG 75/25 MIX) (75-25) 100 UNIT/ML SUSP injection Inject 15 Units into the skin daily with supper.    Historical Provider, MD  pantoprazole (PROTONIX) 20 MG tablet Take 1 tablet (20 mg total) by mouth daily. 02/23/14   Lorre Nick, MD  sucralfate (CARAFATE) 1 G tablet Take 1 tablet (1 g total) by mouth 4 (four) times daily. 02/23/14  Lorre Nick, MD  traZODone (DESYREL) 150 MG tablet Take 150 mg by mouth at bedtime.    Historical Provider, MD   BP 131/103 mmHg  Pulse 84  Temp(Src) 98 F (36.7 C) (Oral)  Resp 18  Ht  (1.753 m)  Wt 113.399 kg  BMI 36.90 kg/m2  SpO2 97%  LMP 02/26/2015 Physical Exam  Constitutional: She appears well-developed and well-nourished. No distress.  Nontoxic appearing. Obese female.  HENT:  Head: Normocephalic and atraumatic.   Mouth/Throat: Oropharynx is clear and moist.  Eyes: Conjunctivae are normal. Pupils are equal, round, and reactive to light. Right eye exhibits no discharge. Left eye exhibits no discharge.  Neck: Neck supple.  Cardiovascular: Normal rate, regular rhythm, normal heart sounds and intact distal pulses.  Exam reveals no gallop and no friction rub.   No murmur heard. Pulmonary/Chest: Effort normal and breath sounds normal. No respiratory distress. She has no wheezes. She has no rales.  Abdominal: Soft. Bowel sounds are normal. There is no tenderness. There is no guarding.  Abdomen is soft and nontender to palpation.  Musculoskeletal: Normal range of motion. She exhibits tenderness. She exhibits no edema.  Patient has tenderness to her left low back. No CVA or flank tenderness. No midline neck or back tenderness. Patient has 5 out of 5 strength in her bilateral upper and lower extremities.  Lymphadenopathy:    She has no cervical adenopathy.  Neurological: She is alert. She has normal reflexes. She displays normal reflexes. Coordination normal.  Sensation is intact to bilateral upper and lower extremities.  Skin: Skin is warm and dry. No rash noted. She is not diaphoretic. No erythema. No pallor.  Psychiatric: She has a normal mood and affect. Her behavior is normal.  Nursing note and vitals reviewed.   ED Course  Procedures (including critical care time) Labs Review Labs Reviewed  URINALYSIS, ROUTINE W REFLEX MICROSCOPIC (NOT AT [facility]) - Abnormal; Notable for the following:    Specific Gravity, Urine 1.036 (*)    Glucose, UA >1000 (*)    Hgb urine dipstick TRACE (*)    All other components within normal limits  BASIC METABOLIC PANEL - Abnormal; Notable for the following:    Sodium 133 (*)    Glucose, Bld 323 (*)    Calcium 8.8 (*)    All other components within normal limits  URINE MICROSCOPIC-ADD ON - Abnormal; Notable for the following:    Squamous Epithelial / LPF 0-5 (*)     Bacteria, UA FEW (*)    All other components within normal limits  CBG MONITORING, ED - Abnormal; Notable for the following:    Glucose-Capillary 360 (*)    All other components within normal limits  URINE CULTURE  PREGNANCY, URINE    Imaging Review No results found. I have personally reviewed and evaluated these images and lab results as part of my medical decision-making.   EKG Interpretation None      Filed Vitals:   02/26/15 1425 02/26/15 1639  BP: 161/96 131/103  Pulse: 95 84  Temp: 98 F (36.7 C)   TempSrc: Oral   Resp: 18 18  Height:  (1.753 m)   Weight: 113.399 kg   SpO2: 98% 97%     MDM   Meds given in ED:  Medications  acetaminophen (TYLENOL) tablet 650 mg (650 mg Oral Refused 02/26/15 1630)  sodium chloride 0.9 % bolus 1,000 mL (0 mLs Intravenous Stopped 02/26/15 1630)    Discharge Medication List as of  02/26/2015  4:33 PM      Final diagnoses:  Left low back pain, with sciatica presence unspecified  Urinary frequency   This is a 40 y.o. female who presents to the emergency department complaining of acute on chronic low back pain worse in the past 3 days. She reports chronic low back pain, that has worsened. She has 10/10 left low back pain. She reports it does not radiate to her abdomen. She also complains of dysuria with urinary frequency and urgency for the past 1-2 weeks. Patient reports she is on antibiotics for an H. pylori infection. She is on her second week of treatment. The patient reports she took ibuprofen this morning without relief of her pain. The patient reports her pain is worse with movement and certain positions.  She reports she has been taking her diabetes medications. She has no back pain red flags. No concern for cauda equina. On exam she is afebrile and nontoxic appearing. She has tenderness to her left low back. No CVA or flank tenderness. No focal neurological deficits.  This patient was looked up on the [place]   controlled substance database. It revealed she received 120 tablets of oxycodone 10 mg on 02/21/15, or 5 days ago. When I asked her about taking any oxycodone the patient reports she took a fourth of a tablet today. She reports she is almost oout of oxycodone and could use some more. This is highly suspicious as she received 120 tablets 5 days ago. I explained to her I would not be providing her with any narcotic pain medicines in the emergency department today but she could have non-narcotic options for pain control.   Patient's blood glucose returned elevated at 323. Normal anion gap. This could help explain her urinary frequency. Urinalysis shows greater than 1000 glucose and trace hemoglobin. Patient appears to have trace hemoglobin chronically. Note nitrites or leukocytes. Urine pregnancy is negative. Urine sent for culture. No signs of infection. No indication for antibiotic therapy at this time. Patient received fluid bolus prior to discharge. At reevaluation patient reports she is feeling much better. She did not receive any narcotic pain medicines. She was able to ambulate out of the ER without difficulty. I encouraged her to follow-up closely with her primary care provider for further management of her diabetes. I have low suspicion for renal stone with patient's history and exam. I suspect more musculoskeletal back pain. I advised the patient to follow-up with their primary care provider this week. I advised the patient to return to the emergency department with new or worsening symptoms or new concerns. The patient verbalized understanding and agreement with plan.       Everlene Farrier, PA-C 02/26/15 1735  Melene Plan, DO 02/27/15 [phone]

## 2015-02-26 NOTE — Discharge Instructions (Signed)
Back Pain, Adult °Back pain is very common in adults. The cause of back pain is rarely dangerous and the pain often gets better over time. The cause of your back pain may not be known. Some common causes of back pain include: °1. Strain of the muscles or ligaments supporting the spine. °2. Wear and tear (degeneration) of the spinal disks. °3. Arthritis. °4. Direct injury to the back. °For many people, back pain may return. Since back pain is rarely dangerous, most people can learn to manage this condition on their own. °HOME CARE INSTRUCTIONS °Watch your back pain for any changes. The following actions may help to lessen any discomfort you are feeling: °1. Remain active. It is stressful on your back to sit or stand in one place for long periods of time. Do not sit, drive, or stand in one place for more than 30 minutes at a time. Take short walks on even surfaces as soon as you are able. Try to increase the length of time you walk each day. °2. Exercise regularly as directed by your health care provider. Exercise helps your back heal faster. It also helps avoid future injury by keeping your muscles strong and flexible. °3. Do not stay in bed. Resting more than 1-2 days can delay your recovery. °4. Pay attention to your body when you bend and lift. The most comfortable positions are those that put less stress on your recovering back. Always use proper lifting techniques, including: °1. Bending your knees. °2. Keeping the load close to your body. °3. Avoiding twisting. °5. Find a comfortable position to sleep. Use a firm mattress and lie on your side with your knees slightly bent. If you lie on your back, put a pillow under your knees. °6. Avoid feeling anxious or stressed. Stress increases muscle tension and can worsen back pain. It is important to recognize when you are anxious or stressed and learn ways to manage it, such as with exercise. °7. Take medicines only as directed by your health care provider.  Over-the-counter medicines to reduce pain and inflammation are often the most helpful. Your health care provider may prescribe muscle relaxant drugs. These medicines help dull your pain so you can more quickly return to your normal activities and healthy exercise. °8. Apply ice to the injured area: °1. Put ice in a plastic bag. °2. Place a towel between your skin and the bag. °3. Leave the ice on for 20 minutes, 2-3 times a day for the first 2-3 days. After that, ice and heat may be alternated to reduce pain and spasms. °9. Maintain a healthy weight. Excess weight puts extra stress on your back and makes it difficult to maintain good posture. °SEEK MEDICAL CARE IF: °1. You have pain that is not relieved with rest or medicine. °2. You have increasing pain going down into the legs or buttocks. °3. You have pain that does not improve in one week. °4. You have night pain. °5. You lose weight. °6. You have a fever or chills. °SEEK IMMEDIATE MEDICAL CARE IF:  °1. You develop new bowel or bladder control problems. °2. You have unusual weakness or numbness in your arms or legs. °3. You develop nausea or vomiting. °4. You develop abdominal pain. °5. You feel faint. °  °This information is not intended to replace advice given to you by your health care provider. Make sure you discuss any questions you have with your health care provider. °  °Document Released: 01/01/2005 Document Revised: 01/22/2014 Document Reviewed: 05/05/2013 °Elsevier Interactive Patient Education ©2016 [employer] ° °Back Exercises °The following exercises strengthen the muscles that help to support the back. They also help to keep the lower back flexible. Doing these exercises can help to prevent back pain or lessen existing pain. °If you have back pain or discomfort, try doing these exercises 2-3 times each day or as told by your health care provider. When the pain goes away, do them once each day, but increase the number of times that you repeat the  steps for each exercise (do more repetitions). If you do not have back pain or discomfort, do these exercises once each day or as told by your health care provider. °EXERCISES °Single Knee to Chest °Repeat these steps 3-5 times for each leg: °5. Lie on your back on a firm bed or the floor with your legs extended. °6. Bring one knee to your chest. Your other leg should stay extended and in contact with the floor. °7. Hold your knee in place by grabbing your knee or thigh. °8. Pull on your knee until you feel a gentle stretch in your lower back. °9. Hold the stretch for 10-30 seconds. °10. Slowly release and straighten your leg. °Pelvic Tilt °Repeat these steps 5-10 times: °10. Lie on your back on a firm bed or the floor with your legs extended. °11. Bend your knees so they are pointing toward the ceiling and your feet are flat on the floor. °12. Tighten your lower abdominal muscles to press your lower back against the floor. This motion will tilt your pelvis so your tailbone points up toward the ceiling instead of pointing to your feet or the floor. °13. With gentle tension and even breathing, hold this position for 5-10 seconds. °Cat-Cow °Repeat these steps until your lower back becomes more flexible: °7. Get into a hands-and-knees position on a firm surface. Keep your hands under your shoulders, and keep your knees under your hips. You may place padding under your knees for comfort. °8. Let your head hang down, and point your tailbone toward the floor so your lower back becomes rounded like the back of a cat. °9. Hold this position for 5 seconds. °10. Slowly lift your head and point your tailbone up toward the ceiling so your back forms a sagging arch like the back of a cow. °11. Hold this position for 5 seconds. °Press-Ups °Repeat these steps 5-10 times: °6. Lie on your abdomen (face-down) on the floor. °7. Place your palms near your head, about shoulder-width apart. °8. While you keep your back as relaxed as  possible and keep your hips on the floor, slowly straighten your arms to raise the top half of your body and lift your shoulders. Do not use your back muscles to raise your upper torso. You may adjust the placement of your hands to make yourself more comfortable. °9. Hold this position for 5 seconds while you keep your back relaxed. °10. Slowly return to lying flat on the floor. °Bridges °Repeat these steps 10 times: °1. Lie on your back on a firm surface. °2. Bend your knees so they are pointing toward the ceiling and your feet are flat on the floor. °3. Tighten your buttocks muscles and lift your buttocks off of the floor until your waist is at almost the same height as your knees. You should feel the muscles working in your buttocks and the back of your thighs. If you do not feel these muscles, slide your feet 1-2 inches farther away from your buttocks. °4. Hold this   position for 3-5 seconds. °5. Slowly lower your hips to the starting position, and allow your buttocks muscles to relax completely. °If this exercise is too easy, try doing it with your arms crossed over your chest. °Abdominal Crunches °Repeat these steps 5-10 times: °1. Lie on your back on a firm bed or the floor with your legs extended. °2. Bend your knees so they are pointing toward the ceiling and your feet are flat on the floor. °3. Cross your arms over your chest. °4. Tip your chin slightly toward your chest without bending your neck. °5. Tighten your abdominal muscles and slowly raise your trunk (torso) high enough to lift your shoulder blades a tiny bit off of the floor. Avoid raising your torso higher than that, because it can put too much stress on your low back and it does not help to strengthen your abdominal muscles. °6. Slowly return to your starting position. °Back Lifts °Repeat these steps 5-10 times: °1. Lie on your abdomen (face-down) with your arms at your sides, and rest your forehead on the floor. °2. Tighten the muscles in your  legs and your buttocks. °3. Slowly lift your chest off of the floor while you keep your hips pressed to the floor. Keep the back of your head in line with the curve in your back. Your eyes should be looking at the floor. °4. Hold this position for 3-5 seconds. °5. Slowly return to your starting position. °SEEK MEDICAL CARE IF: °· Your back pain or discomfort gets much worse when you do an exercise. °· Your back pain or discomfort does not lessen within 2 hours after you exercise. °If you have any of these problems, stop doing these exercises right away. Do not do them again unless your health care provider says that you can. °SEEK IMMEDIATE MEDICAL CARE IF: °· You develop sudden, severe back pain. If this happens, stop doing the exercises right away. Do not do them again unless your health care provider says that you can. °  °This information is not intended to replace advice given to you by your health care provider. Make sure you discuss any questions you have with your health care provider. °  °Document Released: 02/09/2004 Document Revised: 09/22/2014 Document Reviewed: 02/25/2014 °Elsevier Interactive Patient Education ©2016 [employer] ° °

## 2015-02-26 NOTE — ED Notes (Signed)
Left sided back pain with radiation into her flank. Dysuria.  Pt currently taking antibiotic for an unknown 'stomach infection.'

## 2015-02-28 LAB — URINE CULTURE

## 2015-03-16 HISTORY — PX: BARIATRIC SURGERY: SHX1103

## 2015-04-02 ENCOUNTER — Encounter (HOSPITAL_BASED_OUTPATIENT_CLINIC_OR_DEPARTMENT_OTHER): Payer: Self-pay | Admitting: *Deleted

## 2015-04-02 ENCOUNTER — Emergency Department (HOSPITAL_BASED_OUTPATIENT_CLINIC_OR_DEPARTMENT_OTHER): Payer: Medicare HMO

## 2015-04-02 ENCOUNTER — Emergency Department (HOSPITAL_BASED_OUTPATIENT_CLINIC_OR_DEPARTMENT_OTHER)
Admission: EM | Admit: 2015-04-02 | Discharge: 2015-04-02 | Disposition: A | Discharge status: Home / Self Care | Payer: Medicare HMO | Attending: Emergency Medicine | Admitting: Emergency Medicine

## 2015-04-02 DIAGNOSIS — E119 Type 2 diabetes mellitus without complications: Secondary | ICD-10-CM | POA: Diagnosis not present

## 2015-04-02 DIAGNOSIS — G8918 Other acute postprocedural pain: Secondary | ICD-10-CM | POA: Diagnosis not present

## 2015-04-02 DIAGNOSIS — I10 Essential (primary) hypertension: Secondary | ICD-10-CM | POA: Insufficient documentation

## 2015-04-02 DIAGNOSIS — Z87891 Personal history of nicotine dependence: Secondary | ICD-10-CM | POA: Diagnosis not present

## 2015-04-02 DIAGNOSIS — R109 Unspecified abdominal pain: Secondary | ICD-10-CM | POA: Diagnosis present

## 2015-04-02 DIAGNOSIS — K920 Hematemesis: Secondary | ICD-10-CM | POA: Diagnosis not present

## 2015-04-02 DIAGNOSIS — Z9884 Bariatric surgery status: Secondary | ICD-10-CM | POA: Insufficient documentation

## 2015-04-02 LAB — COMPREHENSIVE METABOLIC PANEL
ALK PHOS: 94 U/L (ref 38–126)
ALT: 13 U/L — AB (ref 14–54)
AST: 14 U/L — AB (ref 15–41)
Albumin: 2.9 g/dL — ABNORMAL LOW (ref 3.5–5.0)
Anion gap: 9 (ref 5–15)
BILIRUBIN TOTAL: 0.2 mg/dL — AB (ref 0.3–1.2)
BUN: 12 mg/dL (ref 6–20)
CALCIUM: 8.3 mg/dL — AB (ref 8.9–10.3)
CHLORIDE: 103 mmol/L (ref 101–111)
CO2: 23 mmol/L (ref 22–32)
CREATININE: 0.55 mg/dL (ref 0.44–1.00)
Glucose, Bld: 138 mg/dL — ABNORMAL HIGH (ref 65–99)
Potassium: 3.1 mmol/L — ABNORMAL LOW (ref 3.5–5.1)
Sodium: 135 mmol/L (ref 135–145)
Total Protein: 7.4 g/dL (ref 6.5–8.1)

## 2015-04-02 LAB — CBC WITH DIFFERENTIAL/PLATELET
BASOS PCT: 0 %
Basophils Absolute: 0 10*3/uL (ref 0.0–0.1)
EOS ABS: 0.1 10*3/uL (ref 0.0–0.7)
EOS PCT: 1 %
HCT: 35.7 % — ABNORMAL LOW (ref 36.0–46.0)
HEMOGLOBIN: 11.7 g/dL — AB (ref 12.0–15.0)
LYMPHS ABS: 2.3 10*3/uL (ref 0.7–4.0)
Lymphocytes Relative: 21 %
MCH: 26.8 pg (ref 26.0–34.0)
MCHC: 32.8 g/dL (ref 30.0–36.0)
MCV: 81.9 fL (ref 78.0–100.0)
MONO ABS: 1.1 10*3/uL — AB (ref 0.1–1.0)
Monocytes Relative: 10 %
NEUTROS PCT: 68 %
Neutro Abs: 7.3 10*3/uL (ref 1.7–7.7)
PLATELETS: 433 10*3/uL — AB (ref 150–400)
RBC: 4.36 MIL/uL (ref 3.87–5.11)
RDW: 13.7 % (ref 11.5–15.5)
WBC: 10.8 10*3/uL — AB (ref 4.0–10.5)

## 2015-04-02 LAB — LIPASE, BLOOD: LIPASE: 19 U/L (ref 11–51)

## 2015-04-02 MED ORDER — IOHEXOL 300 MG/ML  SOLN
25.0000 mL | Freq: Once | INTRAMUSCULAR | Status: AC | PRN
Start: 2015-04-02 — End: 2015-04-02
  Administered 2015-04-02: 25 mL via ORAL

## 2015-04-02 MED ORDER — GI COCKTAIL ~~LOC~~
30.0000 mL | Freq: Once | ORAL | Status: AC
  Administered 2015-04-02: 30 mL via ORAL
  Filled 2015-04-02: qty 30

## 2015-04-02 MED ORDER — HYDROMORPHONE HCL 1 MG/ML IJ SOLN
1.0000 mg | Freq: Once | INTRAMUSCULAR | Status: AC
  Administered 2015-04-02: 1 mg via INTRAVENOUS
  Filled 2015-04-02: qty 1

## 2015-04-02 MED ORDER — IOHEXOL 300 MG/ML  SOLN
100.0000 mL | Freq: Once | INTRAMUSCULAR | Status: AC | PRN
  Administered 2015-04-02: 100 mL via INTRAVENOUS

## 2015-04-02 MED ORDER — SODIUM CHLORIDE 0.9 % IV BOLUS (SEPSIS)
1000.0000 mL | Freq: Once | INTRAVENOUS | Status: AC
  Administered 2015-04-02: 1000 mL via INTRAVENOUS

## 2015-04-02 NOTE — ED Provider Notes (Signed)
CSN: [id number]     Arrival date & time 04/02/15  1309 History  By signing my name below, I, [facility], attest that this documentation has been prepared under the direction and in the presence of Marily MemosJason Ema Hebner, MD. Electronically Signed: Randell PatientMarrissa Washington, ED Scribe. 04/02/2015. 6:24 PM.   Chief Complaint  Patient presents with  . Post-op Problem    The history is provided by the patient. No language interpreter was used.   HPI Comments: Cheryl Stokes is a 40 y.o. female who presents to the Emergency Department complaining of post-tussive blood-tinged sputum followed by two episode of emesis productive of scant blood once. Patient reports that she had a gastric sleeve in placed 3 week ago and states that she ate at [employer] when she felt a piece of food become lodged in her throat 2 days ago and has been causing her discomfort and malodorous breath. She endorses gradually impromild abdominal pain that was relieved by a BM that she had since arrival in the ED today and CP that she a. She has been able to eat and drink normally. She has taken a laxative without relief. Per patient, she takes Prilosec. Denies fever, SOB, back pain, leg swelling, dysuria, frequency.   Past Medical History  Diagnosis Date  . Diabetes mellitus without complication (HCC)   . Hypertension   . Sleep apnea    Past Surgical History  Procedure Laterality Date  . Tonsillectomy     History reviewed. No pertinent family history. Social History  Substance Use Topics  . Smoking status: Former Smoker -- 0.00 packs/day  . Smokeless tobacco: None  . Alcohol Use: No   OB History    No data available     Review of Systems    Allergies  Metformin and related and Mobic  Home Medications   Prior to Admission medications   Not on File   BP 147/109 mmHg  Pulse 84  Temp(Src) 98.4 F (36.9 C) (Oral)  Resp 18  Ht 5\' 9"  (1.753 m)  Wt 236 lb (107.049 kg)  BMI 34.84 kg/m2  SpO2 99%  LMP  02/26/2015 Physical Exam  Constitutional: She is oriented to person, place, and time. She appears well-developed and well-nourished. No distress.  HENT:  Head: Normocephalic and atraumatic.  Mouth/Throat: Oropharynx is clear and moist.  Eyes: Conjunctivae and EOM are normal.  Neck: Neck supple. No tracheal deviation present.  Cardiovascular: Normal rate.   Pulmonary/Chest: Effort normal. No respiratory distress.  Abdominal: There is no CVA tenderness.  4x clean incisions in abdomen. No tenderness. No CVA tenderness.   Musculoskeletal: Normal range of motion.  No back terndernss. No calf tenderness. No tenderness with plantar flexion or dorsifelxion   Neurological: She is alert and oriented to person, place, and time.  Skin: Skin is warm and dry.  Psychiatric: She has a normal mood and affect. Her behavior is normal.  Nursing note and vitals reviewed.   ED Course  Procedures   DIAGNOSTIC STUDIES: Oxygen Saturation is 98% on RA, normal by my interpretation.    COORDINATION OF CARE: 3:20 PM Discussed treatment plan with pt at bedside and pt agreed to plan.   Labs Review Labs Reviewed  COMPREHENSIVE METABOLIC PANEL - Abnormal; Notable for the following:    Potassium 3.1 (*)    Glucose, Bld 138 (*)    Calcium 8.3 (*)    Albumin 2.9 (*)    AST 14 (*)    ALT 13 (*)    Total  Bilirubin 0.2 (*)    All other components within normal limits  CBC WITH DIFFERENTIAL/PLATELET - Abnormal; Notable for the following:    WBC 10.8 (*)    Hemoglobin 11.7 (*)    HCT 35.7 (*)    Platelets 433 (*)    Monocytes Absolute 1.1 (*)    All other components within normal limits  LIPASE, BLOOD    Imaging Review Ct Abdomen Pelvis W Contrast  04/02/2015  CLINICAL DATA:  Productive cough with blood tinged sputum and emesis; gastric sleeve three weeks ago; food became caught in throat two days ago with continued discomfort EXAM: CT ABDOMEN AND PELVIS WITH CONTRAST TECHNIQUE: Multidetector CT imaging  of the abdomen and pelvis was performed using the standard protocol following bolus administration of intravenous contrast. CONTRAST:  OMNIPAQUE IOHEXOL 300 MG/ML SOLN, 25mL OMNIPAQUE IOHEXOL 300 MG/ML SOLN COMPARISON:  08/29/2014 FINDINGS: Lower chest: Small left pleural effusion. Mild left lower lobe infiltrate. Hepatobiliary: The liver is normal.  Gallbladder is normal. Pancreas: Normal Spleen: There is a small splenule.  Spleen is normal. Adrenals/Urinary Tract: Normal Stomach/Bowel: Wall thickening distal esophagus. Nonobstructive bowel gas pattern. Small bowel is normal. Large bowel is normal. There is a single prominent diverticulum involving the hepatic flexure. Appendix not visualized. Interposed between the spleen and gastroesophageal junction, there is a round structure demonstrating an air/fluid level with debris, surrounded by mild inflammatory change. There is no demonstrable oral contrast within the abnormality, which appears to originate at the cephalad end of the gastric staple line. Vascular/Lymphatic: There are no acute vascular abnormalities. Mild nonpathologic periaortic retroperitoneal adenopathy. Reproductive: Uterus appears normal.  There are no pelvic masses. Other: There is a small volume of fluid in the pelvis within physiologic limits of normal. Musculoskeletal: There are no acute osseous abnormalities. There is a large L4-5 disc bulge which is partially calcified. There are moderate L5-S1 and L3-4 disc bulges. These all cause canal narrowing. IMPRESSION: There is an apparently extraluminal collection of air, fluid, and debris just beyond and posterior to the gastroesophageal junction with mild surrounding inflammation concerning for abscess. Although oral contrast is not demonstrated within it, the possibility of leak from the gastric surgical site is a concern. Left pleural effusion likely sympathetic, with partially visualized left lower lobe infiltrate likely associated  atelectasis, although pneumonitis not excluded. Electronically Signed   By: Esperanza Heir M.D.   On: 04/02/2015 17:45   I have personally reviewed and evaluated these images and lab results as part of my medical decision-making.   EKG Interpretation None      MDM   Final diagnoses:  Post-op pain    Ct done to evaluate for post op compl;ications and showed apparent fluid collection around GE junction in area of staples, concerning for abscess. Repeat exam unremarkable so will contact surgeon for recommendations.  Discussed CT findings with [name], he said in the absence of a fever, severe pain, persistent vomiting, leukocytosis or other e/o ifnection the fluid collection is likely surgicell that he used in that exact area. Patient tolerating PO here. Repeat exam normal. [name] will see in the office this week, otherwise patient return here for new/worsening symptoms. Has rx at home for n/v and for pain.  New Prescriptions: New Prescriptions   No medications on file   I have personally and contemperaneously reviewed labs and imaging and used in my decision making as above.   A medical screening exam was performed and I feel the patient has had an appropriate  workup for their chief complaint at this time and likelihood of emergent condition existing is low. Their vital signs are stable. They have been counseled on decision, discharge, follow up and which symptoms necessitate immediate return to the emergency department.  They verbally stated understanding and agreement with plan and discharged in stable condition.    I personally performed the services described in this documentation, which was scribed in my presence. The recorded information has been reviewed and is accurate.    [name], MD 04/02/15 [phone]

## 2015-04-02 NOTE — ED Notes (Signed)
Pt reports belching , foul smell, and feeling of mushroom stuck in her throat. When vomited today there was blood tinged in emesis.  Discomfort in throat. Only taking protein shakes and broth post-operation. Also reports bowel started moving today after taking laxatives.

## 2015-04-02 NOTE — ED Notes (Signed)
Pt reports that she has someone coming to pick her up. Was educated on the reason and inability to drive due to given narcotic. Pt verbalized understanding.

## 2015-04-02 NOTE — ED Notes (Signed)
Pt reports that she had a gastric sleeve done March 7th, reports that she thinks that she has a mushroom stuck in her throat-reports an odor from her throat and spiting up blood tinged sputum.

## 2015-04-12 ENCOUNTER — Emergency Department (HOSPITAL_BASED_OUTPATIENT_CLINIC_OR_DEPARTMENT_OTHER): Payer: Medicare HMO

## 2015-04-12 ENCOUNTER — Encounter (HOSPITAL_BASED_OUTPATIENT_CLINIC_OR_DEPARTMENT_OTHER): Payer: Self-pay | Admitting: *Deleted

## 2015-04-12 ENCOUNTER — Emergency Department (HOSPITAL_BASED_OUTPATIENT_CLINIC_OR_DEPARTMENT_OTHER)
Admission: EM | Admit: 2015-04-12 | Discharge: 2015-04-13 | Disposition: A | Discharge status: Other Acute Inpatient Hospital | Payer: Medicare HMO | Attending: Emergency Medicine | Admitting: Emergency Medicine

## 2015-04-12 DIAGNOSIS — Z8669 Personal history of other diseases of the nervous system and sense organs: Secondary | ICD-10-CM | POA: Insufficient documentation

## 2015-04-12 DIAGNOSIS — T814XXA Infection following a procedure, initial encounter: Secondary | ICD-10-CM | POA: Insufficient documentation

## 2015-04-12 DIAGNOSIS — Z7984 Long term (current) use of oral hypoglycemic drugs: Secondary | ICD-10-CM | POA: Diagnosis not present

## 2015-04-12 DIAGNOSIS — R05 Cough: Secondary | ICD-10-CM | POA: Insufficient documentation

## 2015-04-12 DIAGNOSIS — Z79899 Other long term (current) drug therapy: Secondary | ICD-10-CM | POA: Diagnosis not present

## 2015-04-12 DIAGNOSIS — R000 Tachycardia, unspecified: Secondary | ICD-10-CM | POA: Diagnosis not present

## 2015-04-12 DIAGNOSIS — R197 Diarrhea, unspecified: Secondary | ICD-10-CM | POA: Insufficient documentation

## 2015-04-12 DIAGNOSIS — Y658 Other specified misadventures during surgical and medical care: Secondary | ICD-10-CM | POA: Diagnosis not present

## 2015-04-12 DIAGNOSIS — K651 Peritoneal abscess: Secondary | ICD-10-CM | POA: Insufficient documentation

## 2015-04-12 DIAGNOSIS — R112 Nausea with vomiting, unspecified: Secondary | ICD-10-CM | POA: Insufficient documentation

## 2015-04-12 DIAGNOSIS — I10 Essential (primary) hypertension: Secondary | ICD-10-CM | POA: Insufficient documentation

## 2015-04-12 DIAGNOSIS — E119 Type 2 diabetes mellitus without complications: Secondary | ICD-10-CM | POA: Diagnosis not present

## 2015-04-12 DIAGNOSIS — Z794 Long term (current) use of insulin: Secondary | ICD-10-CM | POA: Insufficient documentation

## 2015-04-12 DIAGNOSIS — IMO0001 Reserved for inherently not codable concepts without codable children: Secondary | ICD-10-CM

## 2015-04-12 DIAGNOSIS — R509 Fever, unspecified: Secondary | ICD-10-CM | POA: Diagnosis present

## 2015-04-12 LAB — CBC WITH DIFFERENTIAL/PLATELET
BAND NEUTROPHILS: 2 %
BASOS ABS: 0.2 10*3/uL — AB (ref 0.0–0.1)
BASOS PCT: 1 %
Eosinophils Absolute: 0 10*3/uL (ref 0.0–0.7)
Eosinophils Relative: 0 %
HEMATOCRIT: 32.7 % — AB (ref 36.0–46.0)
Hemoglobin: 11.2 g/dL — ABNORMAL LOW (ref 12.0–15.0)
LYMPHS ABS: 3.1 10*3/uL (ref 0.7–4.0)
LYMPHS PCT: 19 %
MCH: 26.7 pg (ref 26.0–34.0)
MCHC: 34.3 g/dL (ref 30.0–36.0)
MCV: 78 fL (ref 78.0–100.0)
MONO ABS: 1.1 10*3/uL — AB (ref 0.1–1.0)
MYELOCYTES: 1 %
Monocytes Relative: 7 %
Neutro Abs: 11.7 10*3/uL — ABNORMAL HIGH (ref 1.7–7.7)
Neutrophils Relative %: 70 %
Platelets: 351 10*3/uL (ref 150–400)
RBC: 4.19 MIL/uL (ref 3.87–5.11)
RDW: 13.7 % (ref 11.5–15.5)
WBC: 16.1 10*3/uL — ABNORMAL HIGH (ref 4.0–10.5)

## 2015-04-12 LAB — COMPREHENSIVE METABOLIC PANEL
ALBUMIN: 2.3 g/dL — AB (ref 3.5–5.0)
ALK PHOS: 417 U/L — AB (ref 38–126)
ALT: 73 U/L — AB (ref 14–54)
AST: 81 U/L — AB (ref 15–41)
Anion gap: 9 (ref 5–15)
BILIRUBIN TOTAL: 1.5 mg/dL — AB (ref 0.3–1.2)
BUN: 7 mg/dL (ref 6–20)
CALCIUM: 8.2 mg/dL — AB (ref 8.9–10.3)
CO2: 25 mmol/L (ref 22–32)
CREATININE: 0.53 mg/dL (ref 0.44–1.00)
Chloride: 102 mmol/L (ref 101–111)
GFR calc Af Amer: >60 mL/min (ref >60)
GLUCOSE: 164 mg/dL — AB (ref 65–99)
POTASSIUM: 3.4 mmol/L — AB (ref 3.5–5.1)
Sodium: 136 mmol/L (ref 135–145)
TOTAL PROTEIN: 7.4 g/dL (ref 6.5–8.1)

## 2015-04-12 LAB — PREGNANCY, URINE: Preg Test, Ur: NEGATIVE

## 2015-04-12 LAB — URINE MICROSCOPIC-ADD ON

## 2015-04-12 LAB — URINALYSIS, ROUTINE W REFLEX MICROSCOPIC
Glucose, UA: NEGATIVE mg/dL
Ketones, ur: 15 mg/dL — AB
NITRITE: NEGATIVE
PROTEIN: 30 mg/dL — AB
SPECIFIC GRAVITY, URINE: 1.02 (ref 1.005–1.030)
pH: 6 (ref 5.0–8.0)

## 2015-04-12 LAB — LIPASE, BLOOD

## 2015-04-12 MED ORDER — ACETAMINOPHEN 500 MG PO TABS
1000.0000 mg | ORAL_TABLET | Freq: Once | ORAL | Status: AC
  Administered 2015-04-12: 1000 mg via ORAL
  Filled 2015-04-12: qty 2

## 2015-04-12 MED ORDER — ONDANSETRON HCL 4 MG/2ML IJ SOLN
4.0000 mg | Freq: Once | INTRAMUSCULAR | Status: AC
  Administered 2015-04-12: 4 mg via INTRAVENOUS
  Filled 2015-04-12: qty 2

## 2015-04-12 MED ORDER — SODIUM CHLORIDE 0.9 % IV BOLUS (SEPSIS)
1000.0000 mL | Freq: Once | INTRAVENOUS | Status: AC
  Administered 2015-04-12: 1000 mL via INTRAVENOUS

## 2015-04-12 MED ORDER — MORPHINE SULFATE (PF) 4 MG/ML IV SOLN
4.0000 mg | Freq: Once | INTRAVENOUS | Status: AC
  Administered 2015-04-12: 4 mg via INTRAVENOUS
  Filled 2015-04-12: qty 1

## 2015-04-12 MED ORDER — IOPAMIDOL (ISOVUE-300) INJECTION 61%
100.0000 mL | Freq: Once | INTRAVENOUS | Status: AC | PRN
  Administered 2015-04-12: 100 mL via INTRAVENOUS

## 2015-04-12 MED ORDER — MORPHINE SULFATE (PF) 4 MG/ML IV SOLN
4.0000 mg | Freq: Once | INTRAVENOUS | Status: AC
  Administered 2015-04-13: 4 mg via INTRAVENOUS
  Filled 2015-04-12: qty 1

## 2015-04-12 NOTE — ED Notes (Signed)
Fever, cough, vomiting, chills. [facility]. She had a negative flu test at their facility. 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

## 2015-04-12 NOTE — ED Provider Notes (Signed)
CSN: [id number]     Arrival date & time 04/12/15  1745 History  By signing my name below, I, Cheryl Stokes, attest that this documentation has been prepared under the direction and in the presence of Pricilla Loveless, MD. Electronically Signed: Phillis Stokes, ED Scribe. 04/12/2015. 8:36 PM.  Chief Complaint  Patient presents with  . Fever   The history is provided by the patient. No language interpreter was used.  HPI Comments: Cheryl Stokes is a 40 y.o. Female with hx of HTN, DM, and gastric sleeve placement who presents to the Emergency Department complaining of fever tmax 101 F onset 4 days ago. Pt reports associated productive cough, chills, right upper abdominal pain that she rates 10/10, diarrhea, and vomiting. She states that any time she burps or vomits, "it smells like I am passing gas through my mouth." Pt was seen by her PCP, had a breathing treatment performed, and tested negative for the flu. She denies sore throat, dysuria, hematemesis, hematochezia, chest pain or SOB.   Past Medical History  Diagnosis Date  . Diabetes mellitus without complication (HCC)   . Hypertension   . Sleep apnea    Past Surgical History  Procedure Laterality Date  . Tonsillectomy     No family history on file. Social History  Substance Use Topics  . Smoking status: Former Smoker -- 0.00 packs/day  . Smokeless tobacco: None  . Alcohol Use: No   OB History    No data available     Review of Systems  Constitutional: Positive for fever and chills.  HENT: Negative for sore throat.   Respiratory: Positive for cough.   Gastrointestinal: Positive for nausea, vomiting, abdominal pain and diarrhea. Negative for blood in stool.  Genitourinary: Negative for dysuria.  All other systems reviewed and are negative.  Allergies  Metformin and related and Mobic  Home Medications   Prior to Admission medications   Medication Sig Start Date End Date Taking? Authorizing Provider  Atorvastatin Calcium  (LIPITOR PO) Take by mouth.   Yes Historical Provider, MD  Canagliflozin-Metformin HCl (INVOKAMET PO) Take by mouth.   Yes Historical Provider, MD  clonazePAM (KLONOPIN) 1 MG tablet Take 1 mg by mouth 2 (two) times daily.   Yes Historical Provider, MD  Empagliflozin (JARDIANCE PO) Take by mouth.   Yes Historical Provider, MD  Fluconazole (DIFLUCAN PO) Take by mouth.   Yes Historical Provider, MD  insulin lispro protamine-lispro (HUMALOG 75/25 MIX) (75-25) 100 UNIT/ML SUSP injection Inject into the skin.   Yes Historical Provider, MD  LOSARTAN POTASSIUM PO Take by mouth.   Yes Historical Provider, MD  NYSTATIN PO Take by mouth.   Yes Historical Provider, MD  Oxycodone-Acetaminophen (PERCOCET PO) Take by mouth.   Yes Historical Provider, MD   BP 167/89 mmHg  Pulse 104  Temp(Src) 98.6 F (37 C) (Oral)  Resp 20  Ht  (1.753 m)  Wt 229 lb (103.874 kg)  BMI 33.80 kg/m2  SpO2 98%  LMP 02/26/2015 Physical Exam  Constitutional: She is oriented to person, place, and time. She appears well-developed and well-nourished.  HENT:  Head: Normocephalic and atraumatic.  Right Ear: External ear normal.  Left Ear: External ear normal.  Nose: Nose normal.  Mouth/Throat: Oropharynx is clear and moist. No oropharyngeal exudate.  Eyes: Right eye exhibits no discharge. Left eye exhibits no discharge.  Cardiovascular: Normal rate, regular rhythm and normal heart sounds.   Pulmonary/Chest: Effort normal and breath sounds normal. She has no wheezes.  Abdominal:  Soft. There is tenderness in the right upper quadrant.  RUQ tenderness greater than RLQ tenderness; well healing surgical scars  Neurological: She is alert and oriented to person, place, and time.  Skin: Skin is warm and dry.  Nursing note and vitals reviewed.   ED Course  Procedures (including critical care time) DIAGNOSTIC STUDIES: Oxygen Saturation is 98% on RA, normal by my interpretation.    COORDINATION OF CARE: 8:33 PM-Discussed  treatment plan which includes labs, chest x-ray, nausea medication, IV fluids, and CT scan with pt at bedside and pt agreed to plan.    Labs Review Labs Reviewed  URINALYSIS, ROUTINE W REFLEX MICROSCOPIC (NOT AT [facility]) - Abnormal; Notable for the following:    Color, Urine ORANGE (*)    APPearance CLOUDY (*)    Hgb urine dipstick TRACE (*)    Bilirubin Urine MODERATE (*)    Ketones, ur 15 (*)    Protein, ur 30 (*)    Leukocytes, UA TRACE (*)    All other components within normal limits  URINE MICROSCOPIC-ADD ON - Abnormal; Notable for the following:    Squamous Epithelial / LPF 0-5 (*)    Bacteria, UA FEW (*)    Casts HYALINE CASTS (*)    All other components within normal limits  COMPREHENSIVE METABOLIC PANEL - Abnormal; Notable for the following:    Potassium 3.4 (*)    Glucose, Bld 164 (*)    Calcium 8.2 (*)    Albumin 2.3 (*)    AST 81 (*)    ALT 73 (*)    Alkaline Phosphatase 417 (*)    Total Bilirubin 1.5 (*)    All other components within normal limits  LIPASE, BLOOD - Abnormal; Notable for the following:    Lipase <10 (*)    All other components within normal limits  CBC WITH DIFFERENTIAL/PLATELET - Abnormal; Notable for the following:    WBC 16.1 (*)    Hemoglobin 11.2 (*)    HCT 32.7 (*)    Neutro Abs 11.7 (*)    Monocytes Absolute 1.1 (*)    Basophils Absolute 0.2 (*)    All other components within normal limits  CULTURE, BLOOD (ROUTINE X 2)  CULTURE, BLOOD (ROUTINE X 2)  PREGNANCY, URINE    Imaging Review Dg Chest 2 View  04/12/2015  CLINICAL DATA:  Acute onset of right lower quadrant abdominal pain and weight loss. Mid chest pain and cough. Initial encounter. EXAM: CHEST  2 VIEW COMPARISON:  Chest radiograph from 03/09/2014 FINDINGS: The lungs are well-aerated. Vascular congestion is noted, with mild bilateral atelectasis. There is no evidence of pleural effusion or pneumothorax. The heart is normal in size; the mediastinal contour is within normal limits.  No acute osseous abnormalities are seen. IMPRESSION: Vascular congestion, with mild bilateral atelectasis. Electronically Signed   By: Roanna Raider M.D.   On: 04/12/2015 23:57   Ct Abdomen Pelvis W Contrast  04/12/2015  CLINICAL DATA:  40 year old female with right lower quadrant abdominal pain, fever. Status post gastric sleeve surgery. EXAM: CT ABDOMEN AND PELVIS WITH CONTRAST TECHNIQUE: Multidetector CT imaging of the abdomen and pelvis was performed using the standard protocol following bolus administration of intravenous contrast. CONTRAST:  100 cc Isovue 370 COMPARISON:  CT dated 04/02/2015 FINDINGS: There is a small left pleural effusion with minimal atelectatic changes of the left lung base. The right lung base is clear. No intra-abdominal free air. Small free fluid within the pelvis. There is postsurgical changes of gastric  sleeve. There has been interval increase in the size of the extraluminal fluid collection in the left upper abdomen adjacent to the surgical anastomosis which now measures approximately 8.4 x 6.6 cm (previously 4.8 x 3.7 cm). This collection contains mixed density debris and air. There is mild inflammatory changes of the adjacent fat. This fluid extends to the surgical suture at the gastroesophageal junction and may represent a leak from the anastomotic suture site. A small pocket of air within this collection appears to be juxtaposed to the air in the distal esophagus (series 2 image 21) and may represent underlying small communication. An abscess is not excluded. No definite oral contrast identified within this collection. The liver, gallbladder, pancreas, spleen, adrenal glands, kidneys, visualized ureters, and urinary bladder appear unremarkable. The uterus and ovaries are grossly unremarkable. There is no evidence of bowel obstruction.  Normal appendix. The abdominal aorta and IVC appear unremarkable. No portal venous gas identified. There is no adenopathy. There is mild  diffuse stranding of the subcutaneous soft tissue fat. There is mild degenerative changes of the spine. No acute fracture. IMPRESSION: Interval increase in the size of the extraluminal fluid collection adjacent to the gastric anastomosis concerning for anastomotic leak or an abscess. Electronically Signed   By: Elgie CollardArash  Radparvar M.D.   On: 04/12/2015 23:48   [place] Abdomen Limited  04/12/2015  CLINICAL DATA:  Status post gastric sleeve surgery 03/24/2015. Right upper quadrant abdominal pain, fever and chills. EXAM: US ABDOMEN LIMITED - RIGHT UPPER QUADRANT COMPARISON:  04/02/2015 CT abdomen/pelvis. FINDINGS: Gallbladder: Limited visualization due to patient related factors. No appreciable gallbladder wall thickening. Mild layering sludge without appreciable gallstones. No pericholecystic fluid. Common bile duct: Diameter: 2 mm Liver: Liver parenchyma appears diffusely mildly echogenic, a nonspecific finding probably representing mild diffuse hepatic steatosis. No liver mass detected, noting decreased sensitivity in the setting of an echogenic liver. No definite liver surface irregularity. No convincing perihepatic ascites. IMPRESSION: 1. Limited visualization of the gallbladder. Mild gallbladder sludge, with no appreciable cholelithiasis and no evidence of acute cholecystitis. 2. No biliary ductal dilatation. 3. Mild diffuse hepatic steatosis. Electronically Signed   By: Delbert PhenixJason A Poff M.D.   On: 04/12/2015 23:11   I have personally reviewed and evaluated these images and lab results as part of my medical decision-making.   EKG Interpretation None      MDM   Final diagnoses:  Postoperative intra-abdominal abscess, initial encounter [facility](HCC)    Patient overall appears well here but is tachycardic with diffuse, right greater than left abdominal pain. Has multiple various symptoms that could be respiratory or abdominal in etiology. CT scan obtained given recent gastric sleeve procedure. Ultrasound obtained  given LFT dysfunction. Most concerning finding is the fluid collection around her gastric sleeve. This is bigger than 10 days ago. Concerning for anastomosis failure versus abscess. With fever and elevated white blood cell count, and tachycardia, she will be given antibiotics. Discussed with Dr. Rubye OaksPalmer who [facility] but asked for her to go to the ED. Discussed with ED physician, Dr. Shea Evansunn, who is aware. Dr. Rubye OaksPalmer is requesting Pincus Sanesnvanz rather then vanc and Zosyn.  I personally performed the services described in this documentation, which was scribed in my presence. The recorded information has been reviewed and is accurate.    Pricilla LovelessScott Lennon Richins, MD 04/13/15 [phone]

## 2015-04-13 DIAGNOSIS — T814XXA Infection following a procedure, initial encounter: Secondary | ICD-10-CM | POA: Diagnosis not present

## 2015-04-13 MED ORDER — PIPERACILLIN-TAZOBACTAM 3.375 G IVPB 30 MIN
3.3750 g | Freq: Once | INTRAVENOUS | Status: DC
  Filled 2015-04-13: qty 50

## 2015-04-13 MED ORDER — VANCOMYCIN HCL IN DEXTROSE 1-5 GM/200ML-% IV SOLN: 1000.0000 mg | Freq: Once | INTRAVENOUS | Status: DC

## 2015-04-13 MED ORDER — SODIUM CHLORIDE 0.9 % IV SOLN
1.0000 g | Freq: Once | INTRAVENOUS | Status: AC
  Administered 2015-04-13: 1 g via INTRAVENOUS
  Filled 2015-04-13: qty 1

## 2015-04-18 LAB — CULTURE, BLOOD (ROUTINE X 2)
Culture: NO GROWTH
Culture: NO GROWTH

## 2015-10-29 ENCOUNTER — Emergency Department (HOSPITAL_BASED_OUTPATIENT_CLINIC_OR_DEPARTMENT_OTHER): Payer: Medicare HMO

## 2015-10-29 ENCOUNTER — Emergency Department (HOSPITAL_BASED_OUTPATIENT_CLINIC_OR_DEPARTMENT_OTHER)
Admission: EM | Admit: 2015-10-29 | Discharge: 2015-10-29 | Disposition: A | Discharge status: Home / Self Care | Payer: Medicare HMO | Attending: Emergency Medicine | Admitting: Emergency Medicine

## 2015-10-29 ENCOUNTER — Encounter (HOSPITAL_BASED_OUTPATIENT_CLINIC_OR_DEPARTMENT_OTHER): Payer: Self-pay | Admitting: *Deleted

## 2015-10-29 DIAGNOSIS — W19XXXA Unspecified fall, initial encounter: Secondary | ICD-10-CM | POA: Insufficient documentation

## 2015-10-29 DIAGNOSIS — Y939 Activity, unspecified: Secondary | ICD-10-CM | POA: Insufficient documentation

## 2015-10-29 DIAGNOSIS — Z794 Long term (current) use of insulin: Secondary | ICD-10-CM | POA: Diagnosis not present

## 2015-10-29 DIAGNOSIS — Y999 Unspecified external cause status: Secondary | ICD-10-CM | POA: Diagnosis not present

## 2015-10-29 DIAGNOSIS — E119 Type 2 diabetes mellitus without complications: Secondary | ICD-10-CM | POA: Diagnosis not present

## 2015-10-29 DIAGNOSIS — Z87891 Personal history of nicotine dependence: Secondary | ICD-10-CM | POA: Diagnosis not present

## 2015-10-29 DIAGNOSIS — Z79899 Other long term (current) drug therapy: Secondary | ICD-10-CM | POA: Insufficient documentation

## 2015-10-29 DIAGNOSIS — Y929 Unspecified place or not applicable: Secondary | ICD-10-CM | POA: Insufficient documentation

## 2015-10-29 DIAGNOSIS — I10 Essential (primary) hypertension: Secondary | ICD-10-CM | POA: Insufficient documentation

## 2015-10-29 DIAGNOSIS — S79911A Unspecified injury of right hip, initial encounter: Secondary | ICD-10-CM | POA: Diagnosis present

## 2015-10-29 DIAGNOSIS — S7001XA Contusion of right hip, initial encounter: Secondary | ICD-10-CM | POA: Diagnosis not present

## 2015-10-29 MED ORDER — MORPHINE SULFATE (PF) 4 MG/ML IV SOLN
4.0000 mg | Freq: Once | INTRAVENOUS | Status: AC
  Administered 2015-10-29: 4 mg via INTRAMUSCULAR
  Filled 2015-10-29: qty 1

## 2015-10-29 MED ORDER — KETOROLAC TROMETHAMINE 60 MG/2ML IM SOLN
15.0000 mg | Freq: Once | INTRAMUSCULAR | Status: AC
  Administered 2015-10-29: 15 mg via INTRAMUSCULAR
  Filled 2015-10-29: qty 2

## 2015-10-29 NOTE — ED Provider Notes (Signed)
[facility] Provider Note   CSN: [id number] Arrival date & time: 10/29/15  1057     History   Chief Complaint Chief Complaint  Patient presents with  . Fall    HPI Cheryl Stokes is a 40 y.o. female.  The history is provided by the patient and medical records. No language interpreter was used.  Fall  Pertinent negatives include no abdominal pain.   Cheryl Stokes is a 40 y.o. female  with a PMH of DM, HTN, sleep apnea, chronic low back pain who presents to the Emergency Department complaining of persistent right-sided hip pain that began after a fall 3 days ago. Patient states that she was seen by her primary care provider on the day of the fall where he told her to take her usual 10 mg oxycodone along with symptomatic instructions such as ice and Tylenol. She has been doing this, but states that the pain has not improved at all. She states that her primary care provider told her that if her symptoms do not improve that she needs to come to the emergency department for x-rays. She walks with a cane at baseline and has been able to ambulate with cane over the last 2-3 days. She denies any numbness, tingling, saddle anesthesia, bowel or bladder incontinence, fevers. She has chronic back pain, but no new or worsening back pain from baseline. No swelling or wounds to the area.  Past Medical History:  Diagnosis Date  . Diabetes mellitus without complication (HCC)   . Hypertension   . Sleep apnea     There are no active problems to display for this patient.   Past Surgical History:  Procedure Laterality Date  . TONSILLECTOMY      OB History    No data available       Home Medications    Prior to Admission medications   Medication Sig Start Date End Date Taking? Authorizing Provider  Atorvastatin Calcium (LIPITOR PO) Take by mouth.    Historical Provider, MD  Canagliflozin-Metformin HCl (INVOKAMET PO) Take by mouth.    Historical Provider, MD  clonazePAM  (KLONOPIN) 1 MG tablet Take 1 mg by mouth 2 (two) times daily.    Historical Provider, MD  Empagliflozin (JARDIANCE PO) Take by mouth.    Historical Provider, MD  Fluconazole (DIFLUCAN PO) Take by mouth.    Historical Provider, MD  insulin lispro protamine-lispro (HUMALOG 75/25 MIX) (75-25) 100 UNIT/ML SUSP injection Inject into the skin.    Historical Provider, MD  LOSARTAN POTASSIUM PO Take by mouth.    Historical Provider, MD  NYSTATIN PO Take by mouth.    Historical Provider, MD  Oxycodone-Acetaminophen (PERCOCET PO) Take by mouth.    Historical Provider, MD    Family History No family history on file.  Social History Social History  Substance Use Topics  . Smoking status: Former Smoker    Packs/day: 0.00  . Smokeless tobacco: Not on file  . Alcohol use No     Allergies   Metformin and related and Mobic [meloxicam]   Review of Systems Review of Systems  Constitutional: Negative for fever.  Gastrointestinal: Negative for abdominal pain.  Musculoskeletal: Positive for arthralgias and myalgias.  Skin: Negative for color change and wound.     Physical Exam Updated Vital Signs BP 132/79   Pulse 101   Temp 98 F (36.7 C)   Resp 16   Ht 5\' 9"  (1.753 m)   Wt 86.6 kg   LMP 08/26/2015  SpO2 98%   BMI 28.21 kg/m   Physical Exam  Constitutional: She is oriented to person, place, and time. She appears well-developed and well-nourished. No distress.  HENT:  Head: Normocephalic and atraumatic.  Cardiovascular: Normal rate, regular rhythm, normal heart sounds and intact distal pulses.   No murmur heard. 2 + DP bilaterally.  Pulmonary/Chest: Effort normal and breath sounds normal. No respiratory distress.  Abdominal: Soft. She exhibits no distension. There is no tenderness.  Musculoskeletal:       Legs: TTP as depicted in image. Decreased ROM 2/2 pain. No pain with leg log roll. Pain with hip flexion. No C/T/L spine tenderness. All compartments soft. Sensation intact.    Neurological: She is alert and oriented to person, place, and time.  Bilateral lower extremities neurovascularly intact.   Skin: Skin is warm and dry.  Nursing note and vitals reviewed.    ED Treatments / Results  Labs (all labs ordered are listed, but only abnormal results are displayed) Labs Reviewed - No data to display  EKG  EKG Interpretation None       Radiology Dg Hip Unilat W Or Wo Pelvis 2-3 Views Right  Result Date: 10/29/2015 CLINICAL DATA:  Status post fall.  Pain. EXAM: DG HIP (WITH OR WITHOUT PELVIS) 2-3V RIGHT COMPARISON:  None. FINDINGS: There is no evidence of hip fracture or dislocation. There is no evidence of arthropathy or other focal bone abnormality. IMPRESSION: Negative. Electronically Signed   By: Elige Ko   On: 10/29/2015 11:51    Procedures Procedures (including critical care time)  Medications Ordered in ED Medications  ketorolac (TORADOL) injection 15 mg (not administered)  morphine 4 MG/ML injection 4 mg (not administered)     Initial Impression / Assessment and Plan / ED Course  I have reviewed the triage vital signs and the nursing notes.  Pertinent labs & imaging results that were available during my care of the patient were reviewed by me and considered in my medical decision making (see chart for details).  Clinical Course   Cheryl Stokes is a 40 y.o. female who presents to ED for right hip pain after fall 3 days ago. Seen by primary care at onset where she was given symptomatic home care instructions along with a refill of her usual oxycodone for chronic back pain. Patient endorses no relief. X-ray was obtained and unremarkable. Toradol and morphine given in ED for pain relief. Patient is ambulatory in the ED with her cane which she states is baseline. Bilateral lower extremities are neurovascularly intact. No red flag symptoms of back pain. Patient states she is very frustrated because her hip still hurts from the fall and needs  further pain management. Discussed with patient that she needs to continue her symptomatic home care regimen and that no further narcotic pain medication other than her home oxycodone is indicated at this time. [place] controlled substance database consulted. Patient received a prescription for 120 10 mg oxycodone on 10/11 (3 days ago) so she should have more than enough. If no improvement in symptoms the next 2-3 days, I strongly encouraged patient to follow up with the orthopedic physician. Evaluation does not show pathology that would require ongoing emergent intervention or inpatient treatment. Patient is hemodynamically stable and mentating appropriately. Discussed findings and plan with patient, who agrees with treatment plan as dictated.  Return precautions discussed and all questions answered.   Final Clinical Impressions(s) / ED Diagnoses   Final diagnoses:  None    New  Prescriptions New Prescriptions   No medications on file     [facility] Nelani Schmelzle, PA-C 10/29/15 1330    Vanetta Mulders, MD 10/30/15 [phone]

## 2015-10-29 NOTE — ED Notes (Signed)
Patient transported to X-ray 

## 2015-10-29 NOTE — ED Triage Notes (Signed)
Pt c/o fall x 2 days ago inuring right hip

## 2015-10-29 NOTE — ED Notes (Signed)
PA at bedside.

## 2015-10-29 NOTE — Discharge Instructions (Signed)
It was my pleasure taking care of you today!  Ice affected area for additional pain relief. Continue your home pain medication regimen. If your symptoms do not improve by Tuesday or Wednesday, please call the orthopedic physician to schedule a follow-up appointment. Return to ER for new or worsening symptoms, any additional concerns.

## 2016-02-11 ENCOUNTER — Emergency Department (HOSPITAL_BASED_OUTPATIENT_CLINIC_OR_DEPARTMENT_OTHER): Payer: Medicare HMO

## 2016-02-11 ENCOUNTER — Emergency Department (HOSPITAL_BASED_OUTPATIENT_CLINIC_OR_DEPARTMENT_OTHER)
Admission: EM | Admit: 2016-02-11 | Discharge: 2016-02-11 | Disposition: A | Discharge status: Home / Self Care | Payer: Medicare HMO | Attending: Emergency Medicine | Admitting: Emergency Medicine

## 2016-02-11 ENCOUNTER — Encounter (HOSPITAL_BASED_OUTPATIENT_CLINIC_OR_DEPARTMENT_OTHER): Payer: Self-pay | Admitting: *Deleted

## 2016-02-11 DIAGNOSIS — R059 Cough, unspecified: Secondary | ICD-10-CM

## 2016-02-11 DIAGNOSIS — R05 Cough: Secondary | ICD-10-CM | POA: Diagnosis not present

## 2016-02-11 DIAGNOSIS — I10 Essential (primary) hypertension: Secondary | ICD-10-CM | POA: Diagnosis not present

## 2016-02-11 DIAGNOSIS — M25512 Pain in left shoulder: Secondary | ICD-10-CM

## 2016-02-11 DIAGNOSIS — Z87891 Personal history of nicotine dependence: Secondary | ICD-10-CM | POA: Diagnosis not present

## 2016-02-11 DIAGNOSIS — M62838 Other muscle spasm: Secondary | ICD-10-CM | POA: Diagnosis not present

## 2016-02-11 DIAGNOSIS — Z79899 Other long term (current) drug therapy: Secondary | ICD-10-CM | POA: Diagnosis not present

## 2016-02-11 DIAGNOSIS — E119 Type 2 diabetes mellitus without complications: Secondary | ICD-10-CM | POA: Insufficient documentation

## 2016-02-11 LAB — CBC WITH DIFFERENTIAL/PLATELET
BAND NEUTROPHILS: 1 %
BASOS PCT: 0 %
Basophils Absolute: 0 10*3/uL (ref 0.0–0.1)
EOS PCT: 0 %
Eosinophils Absolute: 0 10*3/uL (ref 0.0–0.7)
HEMATOCRIT: 32 % — AB (ref 36.0–46.0)
HEMOGLOBIN: 11 g/dL — AB (ref 12.0–15.0)
LYMPHS PCT: 8 %
Lymphs Abs: 1.1 10*3/uL (ref 0.7–4.0)
MCH: 27.2 pg (ref 26.0–34.0)
MCHC: 34.4 g/dL (ref 30.0–36.0)
MCV: 79 fL (ref 78.0–100.0)
MONO ABS: 1.5 10*3/uL — AB (ref 0.1–1.0)
Monocytes Relative: 11 %
Neutro Abs: 11.2 10*3/uL — ABNORMAL HIGH (ref 1.7–7.7)
Neutrophils Relative %: 80 %
Platelets: 330 10*3/uL (ref 150–400)
RBC: 4.05 MIL/uL (ref 3.87–5.11)
RDW: 14 % (ref 11.5–15.5)
WBC: 13.8 10*3/uL — ABNORMAL HIGH (ref 4.0–10.5)

## 2016-02-11 LAB — COMPREHENSIVE METABOLIC PANEL
ALBUMIN: 2.5 g/dL — AB (ref 3.5–5.0)
ALK PHOS: 339 U/L — AB (ref 38–126)
ALT: 31 U/L (ref 14–54)
ANION GAP: 7 (ref 5–15)
AST: 33 U/L (ref 15–41)
BILIRUBIN TOTAL: 1.2 mg/dL (ref 0.3–1.2)
BUN: 16 mg/dL (ref 6–20)
CALCIUM: 8.6 mg/dL — AB (ref 8.9–10.3)
CO2: 27 mmol/L (ref 22–32)
CREATININE: 0.61 mg/dL (ref 0.44–1.00)
Chloride: 100 mmol/L — ABNORMAL LOW (ref 101–111)
GFR calc Af Amer: >60 mL/min (ref >60)
GFR calc non Af Amer: >60 mL/min (ref >60)
GLUCOSE: 140 mg/dL — AB (ref 65–99)
Potassium: 3.9 mmol/L (ref 3.5–5.1)
SODIUM: 134 mmol/L — AB (ref 135–145)
TOTAL PROTEIN: 8.2 g/dL — AB (ref 6.5–8.1)

## 2016-02-11 MED ORDER — METHOCARBAMOL 500 MG PO TABS: 500.0000 mg | ORAL_TABLET | Freq: Two times a day (BID) | ORAL | 0 refills | Status: DC

## 2016-02-11 MED ORDER — ONDANSETRON 4 MG PO TBDP: ORAL_TABLET | ORAL | 0 refills | Status: DC

## 2016-02-11 MED ORDER — KETOROLAC TROMETHAMINE 60 MG/2ML IM SOLN
30.0000 mg | Freq: Once | INTRAMUSCULAR | Status: AC
  Administered 2016-02-11: 30 mg via INTRAMUSCULAR
  Filled 2016-02-11: qty 2

## 2016-02-11 MED ORDER — ONDANSETRON 4 MG PO TBDP
4.0000 mg | ORAL_TABLET | Freq: Once | ORAL | Status: AC
  Administered 2016-02-11: 4 mg via ORAL
  Filled 2016-02-11: qty 1

## 2016-02-11 MED ORDER — ALBUTEROL SULFATE HFA 108 (90 BASE) MCG/ACT IN AERS
4.0000 | INHALATION_SPRAY | Freq: Once | RESPIRATORY_TRACT | Status: AC
  Administered 2016-02-11: 4 via RESPIRATORY_TRACT
  Filled 2016-02-11: qty 6.7

## 2016-02-11 MED ORDER — BENZONATATE 100 MG PO CAPS: 100.0000 mg | ORAL_CAPSULE | Freq: Three times a day (TID) | ORAL | 0 refills | Status: DC

## 2016-02-11 MED ORDER — ACETAMINOPHEN 325 MG PO TABS: 650.0000 mg | ORAL_TABLET | Freq: Four times a day (QID) | ORAL | 0 refills | Status: DC | PRN

## 2016-02-11 NOTE — ED Triage Notes (Signed)
Patient states she has a three or four day history of left neck and shoulder pain with coughing.  States she also has been having nausea and vomiting with any food or fluid intake.  Weight loss surgery in March 2017.  States she has had a temperature of unknown amount, which is associated with chills and sweats.  Has taken OTC meds with relief.  No Ibuprofen since yesterday.  Also was exposed to pink eye.

## 2016-02-11 NOTE — ED Notes (Signed)
ED Provider at bedside. 

## 2016-02-11 NOTE — ED Provider Notes (Signed)
[facility]   CSN: [id number] Arrival date & time: 02/11/16  [id number]     History   Chief Complaint Chief Complaint  Patient presents with  . Shoulder Pain    left shoulder pain with coughing    HPI Cheryl Stokes is a 41 y.o. female.  41 year old female here with multiple complaints. It seems like the patient's symptoms all started a few days ago with coughing. She had multiple episodes of coughing that started having left shoulder pain. Sometimes she coughs so much that she actually had nausea and vomiting as well. She has some fevers that were unmeasured. She's had intermittent body aches, chills and other flulike symptoms as well. She's been around multiple people that were ill. the shoulder pain got a lot worse today so that brought her in. This shoulder pain seems to be worse with coughing and taking deep breaths. That's located around the trapezius and left clavicle. No chest pain. Stress breath only with coughing. No other nausea or vomiting. No diaphoresis or lightheadedness. No history of the same.      Past Medical History:  Diagnosis Date  . Diabetes mellitus without complication (HCC)   . Hypertension   . Sleep apnea     There are no active problems to display for this patient.   Past Surgical History:  Procedure Laterality Date  . BARIATRIC SURGERY  03/2015   Sleve  . TONSILLECTOMY      OB History    No data available       Home Medications    Prior to Admission medications   Medication Sig Start Date End Date Taking? Authorizing Provider  calcium-vitamin D (OSCAL WITH D) 250-125 MG-UNIT tablet Take 1 tablet by mouth daily.   Yes Historical Provider, MD  Empagliflozin (JARDIANCE PO) Take by mouth.   Yes Historical Provider, MD  ibuprofen (ADVIL,MOTRIN) 800 MG tablet Take 800 mg by mouth every 8 (eight) hours as needed.   Yes Historical Provider, MD  LOSARTAN POTASSIUM PO Take by mouth.   Yes Historical Provider, MD    Multiple Vitamin (MULTIVITAMIN) tablet Take 1 tablet by mouth daily.   Yes Historical Provider, MD  NYSTATIN PO Take by mouth.   Yes Historical Provider, MD  Oxycodone-Acetaminophen (PERCOCET PO) Take by mouth.   Yes Historical Provider, MD  acetaminophen (TYLENOL) 325 MG tablet Take 2 tablets (650 mg total) by mouth every 6 (six) hours as needed for moderate pain. 02/11/16   Marily MemosJason Driana Dazey, MD  Atorvastatin Calcium (LIPITOR PO) Take by mouth.    Historical Provider, MD  benzonatate (TESSALON) 100 MG capsule Take 1 capsule (100 mg total) by mouth every 8 (eight) hours. 02/11/16   Marily MemosJason Rohil Lesch, MD  Canagliflozin-Metformin HCl (INVOKAMET PO) Take by mouth.    Historical Provider, MD  clonazePAM (KLONOPIN) 1 MG tablet Take 1 mg by mouth 2 (two) times daily.    Historical Provider, MD  Fluconazole (DIFLUCAN PO) Take by mouth.    Historical Provider, MD  insulin lispro protamine-lispro (HUMALOG 75/25 MIX) (75-25) 100 UNIT/ML SUSP injection Inject into the skin.    Historical Provider, MD  methocarbamol (ROBAXIN) 500 MG tablet Take 1 tablet (500 mg total) by mouth 2 (two) times daily. 02/11/16   Marily MemosJason Zenaya Ulatowski, MD  ondansetron (ZOFRAN ODT) 4 MG disintegrating tablet 4mg  ODT q4 hours prn nausea/vomit 02/11/16   Marily MemosJason Ashutosh Dieguez, MD    Family History No family history on file.  Social History Social History  Substance Use Topics  .  Smoking status: Former Smoker    Packs/day: 0.00  . Smokeless tobacco: Never Used  . Alcohol use No     Allergies   Metformin and related and Mobic [meloxicam]   Review of Systems Review of Systems  All other systems reviewed and are negative.    Physical Exam Updated Vital Signs BP 125/79 (BP Location: Left Arm)   Pulse 92   Temp 98.8 F (37.1 C) (Oral)   Resp 18   Ht 5\' 9"  (1.753 m)   Wt 180 lb (81.6 kg)   LMP 02/10/2016   SpO2 100%   BMI 26.58 kg/m   Physical Exam  Constitutional: She appears well-developed and well-nourished.  HENT:  Head:  Normocephalic and atraumatic.  Eyes: Conjunctivae and EOM are normal.  Neck: Normal range of motion.  Cardiovascular: Normal rate and regular rhythm.   Pulmonary/Chest: No stridor. No respiratory distress. She has wheezes.  Abdominal: She exhibits no distension.  Musculoskeletal: She exhibits tenderness (left trapezius spasm and tender).  Neurological: She is alert.  Skin: Skin is warm and dry.  Nursing note and vitals reviewed.    ED Treatments / Results  Labs (all labs ordered are listed, but only abnormal results are displayed) Labs Reviewed  COMPREHENSIVE METABOLIC PANEL - Abnormal; Notable for the following:       Result Value   Sodium 134 (*)    Chloride 100 (*)    Glucose, Bld 140 (*)    Calcium 8.6 (*)    Total Protein 8.2 (*)    Albumin 2.5 (*)    Alkaline Phosphatase 339 (*)    All other components within normal limits  CBC WITH DIFFERENTIAL/PLATELET    EKG  EKG Interpretation  Date/Time:  Saturday February 11 2016 09:48:12 EST Ventricular Rate:  88 PR Interval:    QRS Duration: 75 QT Interval:  354 QTC Calculation: 429 R Axis:   44 Text Interpretation:  Sinus rhythm Low voltage, precordial leads Probable anteroseptal infarct, old Confirmed by [facility] MD, Kyesha Balla [phone]) on 02/11/2016 10:51:08 AM       Radiology Dg Chest 2 View  Result Date: 02/11/2016 CLINICAL DATA:  New cough and fever EXAM: CHEST  2 VIEW COMPARISON:  04/12/2015 FINDINGS: The heart size and mediastinal contours are within normal limits. Both lungs are clear. The visualized skeletal structures are unremarkable except for stable thoracic spondylosis. IMPRESSION: No active cardiopulmonary disease. Electronically Signed   By: Judie Petit.  Shick M.D.   On: 02/11/2016 10:22    Procedures Procedures (including critical care time)  Medications Ordered in ED Medications  ondansetron (ZOFRAN-ODT) disintegrating tablet 4 mg (4 mg Oral Given 02/11/16 1005)  ketorolac (TORADOL) injection 30 mg (30 mg  Intramuscular Given 02/11/16 1005)  albuterol (PROVENTIL HFA;VENTOLIN HFA) 108 (90 Base) MCG/ACT inhaler 4 puff (4 puffs Inhalation Given 02/11/16 1035)     Initial Impression / Assessment and Plan / ED Course  I have reviewed the triage vital signs and the nursing notes.  Pertinent labs & imaging results that were available during my care of the patient were reviewed by me and considered in my medical decision making (see chart for details).     Chest x-ray negative for any rib fractures, pneumonia or pneumothorax. EKG unremarkable. Labs are reassuring. Suspect the patient's symptoms likely related to muscle skeletal pain. It improved here with Toradol, massage and some heat. She will continue the same. Follow with primary doctor in a few days if not improving.  Final Clinical Impressions(s) / ED Diagnoses  Final diagnoses:  Acute pain of left shoulder  Cough    New Prescriptions New Prescriptions   ACETAMINOPHEN (TYLENOL) 325 MG TABLET    Take 2 tablets (650 mg total) by mouth every 6 (six) hours as needed for moderate pain.   BENZONATATE (TESSALON) 100 MG CAPSULE    Take 1 capsule (100 mg total) by mouth every 8 (eight) hours.   METHOCARBAMOL (ROBAXIN) 500 MG TABLET    Take 1 tablet (500 mg total) by mouth 2 (two) times daily.   ONDANSETRON (ZOFRAN ODT) 4 MG DISINTEGRATING TABLET    4mg  ODT q4 hours prn nausea/vomit     [name], MD 02/11/16 1151

## 2016-02-11 NOTE — ED Notes (Signed)
Patient transported to X-ray 

## 2016-02-16 ENCOUNTER — Encounter (HOSPITAL_BASED_OUTPATIENT_CLINIC_OR_DEPARTMENT_OTHER): Payer: Self-pay | Admitting: Emergency Medicine

## 2016-02-16 ENCOUNTER — Emergency Department (HOSPITAL_BASED_OUTPATIENT_CLINIC_OR_DEPARTMENT_OTHER)
Admission: EM | Admit: 2016-02-16 | Discharge: 2016-02-16 | Disposition: A | Discharge status: Other Acute Inpatient Hospital | Payer: Medicare HMO | Attending: Emergency Medicine | Admitting: Emergency Medicine

## 2016-02-16 ENCOUNTER — Emergency Department (HOSPITAL_BASED_OUTPATIENT_CLINIC_OR_DEPARTMENT_OTHER): Payer: Medicare HMO

## 2016-02-16 DIAGNOSIS — R509 Fever, unspecified: Secondary | ICD-10-CM | POA: Diagnosis present

## 2016-02-16 DIAGNOSIS — R112 Nausea with vomiting, unspecified: Secondary | ICD-10-CM | POA: Diagnosis not present

## 2016-02-16 DIAGNOSIS — L02211 Cutaneous abscess of abdominal wall: Secondary | ICD-10-CM | POA: Diagnosis not present

## 2016-02-16 DIAGNOSIS — E119 Type 2 diabetes mellitus without complications: Secondary | ICD-10-CM | POA: Insufficient documentation

## 2016-02-16 DIAGNOSIS — A419 Sepsis, unspecified organism: Secondary | ICD-10-CM | POA: Diagnosis not present

## 2016-02-16 DIAGNOSIS — Z79899 Other long term (current) drug therapy: Secondary | ICD-10-CM | POA: Diagnosis not present

## 2016-02-16 DIAGNOSIS — Z794 Long term (current) use of insulin: Secondary | ICD-10-CM | POA: Diagnosis not present

## 2016-02-16 DIAGNOSIS — I10 Essential (primary) hypertension: Secondary | ICD-10-CM | POA: Insufficient documentation

## 2016-02-16 DIAGNOSIS — Z87891 Personal history of nicotine dependence: Secondary | ICD-10-CM | POA: Diagnosis not present

## 2016-02-16 DIAGNOSIS — K6289 Other specified diseases of anus and rectum: Secondary | ICD-10-CM | POA: Diagnosis not present

## 2016-02-16 DIAGNOSIS — IMO0002 Reserved for concepts with insufficient information to code with codable children: Secondary | ICD-10-CM

## 2016-02-16 LAB — URINALYSIS, MICROSCOPIC (REFLEX): RBC / HPF: NONE SEEN RBC/hpf (ref 0–5)

## 2016-02-16 LAB — COMPREHENSIVE METABOLIC PANEL
ALT: 29 U/L (ref 14–54)
ANION GAP: 12 (ref 5–15)
AST: 58 U/L — ABNORMAL HIGH (ref 15–41)
Albumin: 2 g/dL — ABNORMAL LOW (ref 3.5–5.0)
Alkaline Phosphatase: 370 U/L — ABNORMAL HIGH (ref 38–126)
BILIRUBIN TOTAL: 1.4 mg/dL — AB (ref 0.3–1.2)
BUN: 16 mg/dL (ref 6–20)
CALCIUM: 8.1 mg/dL — AB (ref 8.9–10.3)
CO2: 20 mmol/L — ABNORMAL LOW (ref 22–32)
Chloride: 99 mmol/L — ABNORMAL LOW (ref 101–111)
Creatinine, Ser: 0.74 mg/dL (ref 0.44–1.00)
GFR calc non Af Amer: >60 mL/min (ref >60)
Glucose, Bld: 215 mg/dL — ABNORMAL HIGH (ref 65–99)
POTASSIUM: 4.5 mmol/L (ref 3.5–5.1)
Sodium: 131 mmol/L — ABNORMAL LOW (ref 135–145)
TOTAL PROTEIN: 7.5 g/dL (ref 6.5–8.1)

## 2016-02-16 LAB — URINALYSIS, ROUTINE W REFLEX MICROSCOPIC
Glucose, UA: NEGATIVE mg/dL
Hgb urine dipstick: NEGATIVE
Ketones, ur: 15 mg/dL — AB
NITRITE: POSITIVE — AB
PROTEIN: 100 mg/dL — AB
Specific Gravity, Urine: 1.03 (ref 1.005–1.030)
pH: 6 (ref 5.0–8.0)

## 2016-02-16 LAB — I-STAT CG4 LACTIC ACID, ED
LACTIC ACID, VENOUS: 5.74 mmol/L — AB (ref 0.5–1.9)
LACTIC ACID, VENOUS: 2.15 mmol/L — AB (ref 0.5–1.9)
Lactic Acid, Venous: 4.28 mmol/L (ref 0.5–1.9)

## 2016-02-16 LAB — PREGNANCY, URINE: PREG TEST UR: NEGATIVE

## 2016-02-16 MED ORDER — SODIUM CHLORIDE 0.9 % IV BOLUS (SEPSIS)
500.0000 mL | Freq: Once | INTRAVENOUS | Status: AC
  Administered 2016-02-16: 500 mL via INTRAVENOUS

## 2016-02-16 MED ORDER — PIPERACILLIN-TAZOBACTAM 3.375 G IVPB 30 MIN
3.3750 g | Freq: Once | INTRAVENOUS | Status: DC
  Filled 2016-02-16: qty 50

## 2016-02-16 MED ORDER — VANCOMYCIN HCL IN DEXTROSE 1-5 GM/200ML-% IV SOLN: 1000.0000 mg | Freq: Once | INTRAVENOUS | Status: DC

## 2016-02-16 MED ORDER — SODIUM CHLORIDE 0.9 % IV BOLUS (SEPSIS)
1000.0000 mL | Freq: Once | INTRAVENOUS | Status: AC
  Administered 2016-02-16: 1000 mL via INTRAVENOUS

## 2016-02-16 MED ORDER — VANCOMYCIN HCL 10 G IV SOLR
2000.0000 mg | Freq: Once | INTRAVENOUS | Status: AC
  Administered 2016-02-16: 2000 mg via INTRAVENOUS
  Filled 2016-02-16: qty 2000

## 2016-02-16 MED ORDER — FENTANYL CITRATE (PF) 100 MCG/2ML IJ SOLN
50.0000 ug | Freq: Once | INTRAMUSCULAR | Status: AC
  Administered 2016-02-16: 50 ug via INTRAVENOUS
  Filled 2016-02-16: qty 2

## 2016-02-16 MED ORDER — ONDANSETRON HCL 4 MG/2ML IJ SOLN
4.0000 mg | Freq: Once | INTRAMUSCULAR | Status: AC
  Administered 2016-02-16: 4 mg via INTRAVENOUS
  Filled 2016-02-16: qty 2

## 2016-02-16 MED ORDER — SODIUM CHLORIDE 0.9 % IV BOLUS (SEPSIS)
1000.0000 mL | Freq: Once | INTRAVENOUS | Status: AC
Start: 2016-02-16 — End: 2016-02-16
  Administered 2016-02-16: 1000 mL via INTRAVENOUS

## 2016-02-16 MED ORDER — VANCOMYCIN HCL IN DEXTROSE 1-5 GM/200ML-% IV SOLN
1000.0000 mg | Freq: Three times a day (TID) | INTRAVENOUS | Status: DC
  Filled 2016-02-16: qty 200

## 2016-02-16 MED ORDER — PIPERACILLIN-TAZOBACTAM 3.375 G IVPB 30 MIN
3.3750 g | Freq: Once | INTRAVENOUS | Status: AC
  Administered 2016-02-16: 3.375 g via INTRAVENOUS
  Filled 2016-02-16 (×2): qty 50

## 2016-02-16 MED ORDER — ACETAMINOPHEN 500 MG PO TABS
1000.0000 mg | ORAL_TABLET | Freq: Once | ORAL | Status: AC
  Administered 2016-02-16: 1000 mg via ORAL
  Filled 2016-02-16: qty 2

## 2016-02-16 MED ORDER — PIPERACILLIN-TAZOBACTAM 3.375 G IVPB: 3.3750 g | Freq: Three times a day (TID) | INTRAVENOUS | Status: DC

## 2016-02-16 MED ORDER — SODIUM CHLORIDE 0.9 % IV BOLUS (SEPSIS): 1000.0000 mL | Freq: Once | INTRAVENOUS | Status: DC

## 2016-02-16 MED ORDER — IOPAMIDOL (ISOVUE-300) INJECTION 61%
100.0000 mL | Freq: Once | INTRAVENOUS | Status: AC | PRN
  Administered 2016-02-16: 100 mL via INTRAVENOUS

## 2016-02-16 NOTE — ED Notes (Signed)
[facility]

## 2016-02-16 NOTE — ED Triage Notes (Signed)
Pt c/o abd pain and NV, worse since 1/27 visit. Fever and tachycardia noted today.

## 2016-02-16 NOTE — Progress Notes (Signed)
Pharmacy Antibiotic Note  Briyana Su HiltRoberts is a 41 y.o. female admitted on 02/16/2016 with sepsis.  Pharmacy has been consulted for vancomycin/Zosyn dosing. Most recent SCr on 1/27 was 0.61 (CrCl >100 ml/min).  Plan: -Vancomycin 2000mg  IV x1 -Vancomycin 1000mg  IV q8h -Zosyn 3.375g IV over 30 minutes x1 -Zosyn 3.375g IV over 4 hours q8h -F/U LOT, Cx results, renal funx, VT as indicated   Height: 5\' 9"  (175.3 cm) Weight: 178 lb (80.7 kg) IBW/kg (Calculated) : 66.2  Temp (24hrs), Avg:102.9 F (39.4 C), Min:102.9 F (39.4 C), Max:102.9 F (39.4 C)   Recent Labs Lab 02/11/16 1041 02/16/16 1448  WBC 13.8*  --   CREATININE 0.61  --   LATICACIDVEN  --  5.74*    Estimated Creatinine Clearance: 104.1 mL/min (by C-G formula based on SCr of 0.61 mg/dL).    Allergies  Allergen Reactions  . Metformin And Related   . Mobic [Meloxicam]     Antimicrobials this admission: 2/1 Vancomycin >>  2/1 Zosyn >>   Dose adjustments this admission: n/a  Microbiology results: 2/1 UCx: IP 2/1 BCx: IP  Thank you for allowing pharmacy to be a part of this patient's care.  Fredonia HighlandMichael Shelbia Scinto, PharmD PGY-1 Pharmacy Resident Pager: [phone] 02/16/2016

## 2016-02-16 NOTE — ED Provider Notes (Signed)
[facility] Provider Note   CSN: [id number] Arrival date & time: 02/16/16  1412     History   Chief Complaint Chief Complaint  Patient presents with  . Abdominal Pain  . Fever    HPI Waynesha Pion is a 41 y.o. female.  Patient is a 41 year old female with a history of hypertension and diabetes who presents with vomiting and fever. She states she's felt bad for about the last week with chills. She's also had a little bit of rhinorrhea and nonproductive cough. She was seen here on January 27 for cough with associated left shoulder pain which is felt to be musculoskeletal secondary to the coughing. She states now she is having diffuse myalgias. She has a bifrontal-type headache. She's had ongoing vomiting and now has worsening abdominal pain. She does have some urinary urgency and a little bit of burning on urination. She has pain which she describes all over her abdomen but more on the left side. 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 She also complains of rectal pain and a foul-smelling discharge from her rectum.      Past Medical History:  Diagnosis Date  . Diabetes mellitus without complication (HCC)   . Hypertension   . Sleep apnea     There are no active problems to display for this patient.   Past Surgical History:  Procedure Laterality Date  . BARIATRIC SURGERY  03/2015   Sleve  . TONSILLECTOMY      OB History    No data available       Home Medications    Prior to Admission medications   Medication Sig Start Date End Date Taking? Authorizing Provider  acetaminophen (TYLENOL) 325 MG tablet Take 2 tablets (650 mg total) by mouth every 6 (six) hours as needed for moderate pain. 02/11/16   [name], MD  Atorvastatin Calcium (LIPITOR PO) Take by mouth.    Historical Provider, MD  benzonatate (TESSALON) 100 MG capsule Take 1 capsule (100 mg total) by mouth every 8 (eight) hours. 02/11/16   [name], MD  calcium-vitamin D  (OSCAL WITH D) 250-125 MG-UNIT tablet Take 1 tablet by mouth daily.    Historical Provider, MD  Canagliflozin-Metformin HCl (INVOKAMET PO) Take by mouth.    Historical Provider, MD  clonazePAM (KLONOPIN) 1 MG tablet Take 1 mg by mouth 2 (two) times daily.    Historical Provider, MD  Empagliflozin (JARDIANCE PO) Take by mouth.    Historical Provider, MD  Fluconazole (DIFLUCAN PO) Take by mouth.    Historical Provider, MD  ibuprofen (ADVIL,MOTRIN) 800 MG tablet Take 800 mg by mouth every 8 (eight) hours as needed.    Historical Provider, MD  insulin lispro protamine-lispro (HUMALOG 75/25 MIX) (75-25) 100 UNIT/ML SUSP injection Inject into the skin.    Historical Provider, MD  LOSARTAN POTASSIUM PO Take by mouth.    Historical Provider, MD  methocarbamol (ROBAXIN) 500 MG tablet Take 1 tablet (500 mg total) by mouth 2 (two) times daily. 02/11/16   [name], MD  Multiple Vitamin (MULTIVITAMIN) tablet Take 1 tablet by mouth daily.    Historical Provider, MD  NYSTATIN PO Take by mouth.    Historical Provider, MD  ondansetron (ZOFRAN ODT) 4 MG disintegrating tablet 4mg  ODT q4 hours prn nausea/vomit 02/11/16   [name], MD  Oxycodone-Acetaminophen (PERCOCET PO) Take by mouth.    Historical Provider, MD    Family History History reviewed. No pertinent family history.  Social  History Social History  Substance Use Topics  . Smoking status: Former Smoker    Packs/day: 0.00  . Smokeless tobacco: Never Used  . Alcohol use No     Allergies   Metformin and related and Mobic [meloxicam]   Review of Systems Review of Systems  Constitutional: Positive for appetite change, chills and fatigue. Negative for diaphoresis and fever.  HENT: Positive for congestion and rhinorrhea. Negative for sneezing.   Eyes: Negative.   Respiratory: Positive for cough. Negative for chest tightness and shortness of breath.   Cardiovascular: Negative for chest pain and leg swelling.  Gastrointestinal: Positive for  abdominal pain, nausea, rectal pain and vomiting. Negative for blood in stool and diarrhea.  Genitourinary: Negative for difficulty urinating, flank pain, frequency and hematuria.  Musculoskeletal: Positive for arthralgias and myalgias. Negative for back pain.  Skin: Negative for rash.  Neurological: Negative for dizziness, speech difficulty, weakness, numbness and headaches.     Physical Exam Updated Vital Signs BP 117/69   Pulse 111   Temp 98.7 F (37.1 C) (Oral)   Resp 18   Ht 5\' 9"  (1.753 m)   Wt 178 lb (80.7 kg)   LMP 02/10/2016   SpO2 98%   BMI 26.29 kg/m   Physical Exam  Constitutional: She is oriented to person, place, and time. She appears well-developed and well-nourished.  HENT:  Head: Normocephalic and atraumatic.  Mouth/Throat: Oropharynx is clear and moist. No oropharyngeal exudate.  Eyes: Pupils are equal, round, and reactive to light.  Neck: Normal range of motion. Neck supple.  Cardiovascular: Normal rate, regular rhythm and normal heart sounds.   Pulmonary/Chest: Effort normal and breath sounds normal. No respiratory distress. She has no wheezes. She has no rales. She exhibits no tenderness.  Abdominal: Soft. Bowel sounds are normal. There is tenderness. There is no rebound and no guarding.  Positive diffuse tenderness with guarding  Genitourinary:  Genitourinary Comments: Positive pain on rectal exam but no visible discharge is noted. No fullness or induration palpated. No external lesions noted.  Musculoskeletal: Normal range of motion. She exhibits no edema.  Lymphadenopathy:    She has no cervical adenopathy.  Neurological: She is alert and oriented to person, place, and time.  Skin: Skin is warm and dry. No rash noted.  Psychiatric: She has a normal mood and affect.     ED Treatments / Results  Labs (all labs ordered are listed, but only abnormal results are displayed) Labs Reviewed  CBC WITH DIFFERENTIAL/PLATELET - Abnormal; Notable for the  following:       Result Value   WBC 18.1 (*)    RBC 3.81 (*)    Hemoglobin 10.3 (*)    HCT 29.9 (*)    Neutro Abs 17.1 (*)    Lymphs Abs 0.5 (*)    All other components within normal limits  COMPREHENSIVE METABOLIC PANEL - Abnormal; Notable for the following:    Sodium 131 (*)    Chloride 99 (*)    CO2 20 (*)    Glucose, Bld 215 (*)    Calcium 8.1 (*)    Albumin 2.0 (*)    AST 58 (*)    Alkaline Phosphatase 370 (*)    Total Bilirubin 1.4 (*)    All other components within normal limits  URINALYSIS, ROUTINE W REFLEX MICROSCOPIC - Abnormal; Notable for the following:    Color, Urine ORANGE (*)    Bilirubin Urine MODERATE (*)    Ketones, ur 15 (*)  Protein, ur 100 (*)    Nitrite POSITIVE (*)    Leukocytes, UA SMALL (*)    All other components within normal limits  URINALYSIS, MICROSCOPIC (REFLEX) - Abnormal; Notable for the following:    Bacteria, UA FEW (*)    Squamous Epithelial / LPF 0-5 (*)    All other components within normal limits  I-STAT CG4 LACTIC ACID, ED - Abnormal; Notable for the following:    Lactic Acid, Venous 5.74 (*)    All other components within normal limits  I-STAT CG4 LACTIC ACID, ED - Abnormal; Notable for the following:    Lactic Acid, Venous 4.28 (*)    All other components within normal limits  I-STAT CG4 LACTIC ACID, ED - Abnormal; Notable for the following:    Lactic Acid, Venous 2.15 (*)    All other components within normal limits  CULTURE, BLOOD (ROUTINE X 2)  CULTURE, BLOOD (ROUTINE X 2)  URINE CULTURE  PREGNANCY, URINE    EKG  EKG Interpretation  Date/Time:  Thursday February 16 2016 14:31:30 EST Ventricular Rate:  139 PR Interval:    QRS Duration: 73 QT Interval:  265 QTC Calculation: 403 R Axis:   58 Text Interpretation:  Sinus tachycardia Borderline low voltage, extremity leads Confirmed by DELO  MD, DOUGLAS ([id number]) on 02/16/2016 2:39:11 PM       Radiology Dg Chest 2 View  Result Date: 02/16/2016 CLINICAL DATA:  Fever,  episodes of vomiting, and shortness of breath for the past 3 days EXAM: CHEST  2 VIEW COMPARISON:  PA and lateral chest x-ray of February 11, 2016 FINDINGS: The lungs are adequately inflated. The interstitial markings are slightly increased as compared to the earlier study. There is no alveolar infiltrate. There is no pleural effusion. The heart and pulmonary vascularity are normal. The mediastinum is normal in width. The bony thorax is unremarkable. IMPRESSION: Slight interstitial prominence today may reflect acute bronchitic changes. There is no alveolar pneumonia nor CHF. Electronically Signed   By: David  [place] M.D.   On: 02/16/2016 16:49   Ct Abdomen Pelvis W Contrast  Result Date: 02/16/2016 CLINICAL DATA:  Acute onset of lower abdominal pain and vomiting. Generalized chest pain, shortness of breath and fever. Initial encounter. EXAM: CT ABDOMEN AND PELVIS WITH CONTRAST TECHNIQUE: Multidetector CT imaging of the abdomen and pelvis was performed using the standard protocol following bolus administration of intravenous contrast. CONTRAST:  ISOVUE-300 IOPAMIDOL (ISOVUE-300) INJECTION 61% COMPARISON:  CT of the abdomen and pelvis performed 04/12/2015 FINDINGS: Lower chest: Trace left-sided pleural fluid is noted. Minimal left basilar atelectasis is seen. Hepatobiliary: The liver is grossly unremarkable in appearance. The gallbladder is unremarkable. The common bile duct remains normal in caliber. Pancreas: There is mild soft tissue inflammation and decreased attenuation about the tail of the pancreas, raising concern for mild changes of pancreatitis secondary to the chronic infectious process at the left upper quadrant. The remainder of the pancreas is unremarkable. 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 The previously noted collection of fluid at the left upper quadrant has resolved. 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 Mild surrounding soft tissue inflammation is seen. Adrenals/Urinary Tract: The adrenal glands are unremarkable  in appearance. The kidneys are within normal limits. There is no evidence of hydronephrosis. No renal or ureteral stones are identified. No perinephric stranding is seen. Stomach/Bowel: The appendix is unremarkable in appearance. The colon is grossly unremarkable, aside from mild soft tissue inflammation about the splenic flexure of the colon, reflecting the overlying infectious process. The small bowel is grossly unremarkable in appearance. The remaining stomach is relatively decompressed, status post sleeve gastrectomy. Vascular/Lymphatic: The abdominal aorta is unremarkable in appearance. The inferior vena cava is grossly unremarkable. No retroperitoneal lymphadenopathy is seen. No pelvic sidewall lymphadenopathy is identified. Reproductive: The bladder is mildly distended and within normal limits. The uterus is grossly unremarkable in appearance. The ovaries are relatively symmetric. No suspicious adnexal masses are seen. Trace free fluid within the pelvis is likely physiologic in nature. Other: No additional soft tissue abnormalities are seen. Musculoskeletal: No acute osseous abnormalities are identified. Vacuum phenomenon is noted along the lower lumbar spine. The visualized musculature is unremarkable in appearance. IMPRESSION: 1. 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 Mild surrounding soft tissue inflammation noted. The previously noted left upper quadrant fluid collection has resolved. 2. 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 3. Mild soft tissue inflammation and decreased attenuation about the tail of the pancreas, concerning for mild changes of pancreatitis secondary to the chronic infectious process at the left upper quadrant. No evidence of pseudocyst formation. 4. Trace left-sided pleural fluid. Minimal left basilar atelectasis. These results were called by telephone at the time of interpretation on 02/16/2016 at 4:50 pm to Dr. Shawna Orleans Tregan Read, who verbally acknowledged these results. Electronically Signed   By: Roanna Raider M.D.   On: 02/16/2016 17:05    Procedures Procedures (including critical care time)  Medications Ordered in ED Medications  sodium chloride 0.9 % bolus 1,000 mL (0 mLs Intravenous Stopped 02/16/16 1708)    And  sodium chloride 0.9 % bolus 1,000 mL (not administered)    And  sodium chloride 0.9 % bolus 500 mL (0 mLs Intravenous Stopped 02/16/16 1620)  piperacillin-tazobactam (ZOSYN) IVPB 3.375 g (not administered)  vancomycin (VANCOCIN) IVPB 1000 mg/200 mL premix (not administered)  sodium chloride 0.9 % bolus 1,000 mL (0 mLs Intravenous Stopped 02/16/16 1612)  acetaminophen (TYLENOL) tablet 1,000 mg (1,000 mg Oral Given 02/16/16 1541)  vancomycin (VANCOCIN) 2,000 mg in sodium chloride 0.9 % 500 mL IVPB (2,000 mg Intravenous New Bag/Given 02/16/16 1548)  piperacillin-tazobactam (ZOSYN) IVPB 3.375 g (0 g Intravenous Stopped 02/16/16 1607)  iopamidol (ISOVUE-300) 61 % injection 100 mL (100 mLs Intravenous Contrast Given 02/16/16 1626)  ondansetron (ZOFRAN) injection 4 mg (4 mg Intravenous Given 02/16/16 1612)  fentaNYL (SUBLIMAZE) injection 50 mcg (50 mcg Intravenous Given 02/16/16 1742)     Initial Impression / Assessment and Plan / ED  Course  I have reviewed the triage vital signs and the nursing notes.  Pertinent labs & imaging results that were available during my care of the patient were reviewed by me and considered in my medical decision making (see chart for details).     Patient presents with abdominal pain in association with sepsis. She was treated with Zosyn and vancomycin as well as 30 cc/kg fluid bolus. She's had one episode of hypotension that resolved  with IV fluids. She does have evidence of urinary tract infection. She has marked abdominal tenderness. 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 On chart review, she had gastric sleeve surgery done [facility] in March of last year. This was done by Dr. Buzzy Haneppara.  She was subsequently admitted for possible anastomotic leak versus infection. This resolved with IV antibiotics. I feel the patient should be transferred to [facility] for continuity of care from her surgeon. I spoke with Dr. Logan BoresEvans who is on-call for surgery. He has spoken with Dr. Buzzy Haneppara.  There are currently no beds available at [facility] to do a direct transfer. They request patient be transferred to the ED. I spoke with Dr. Sherrie MustacheFisher who is also aware of patient's upcoming transfer.  Final Clinical Impressions(s) / ED Diagnoses   Final diagnoses:  Sepsis, due to unspecified organism [facility](HCC)  Abdominal abscess [facility](HCC)    New Prescriptions New Prescriptions   No medications on file     Rolan BuccoMelanie Jolette Lana, MD 02/16/16 1800

## 2016-02-17 LAB — BLOOD CULTURE ID PANEL (REFLEXED)
Acinetobacter baumannii: NOT DETECTED
CANDIDA GLABRATA: NOT DETECTED
CANDIDA KRUSEI: NOT DETECTED
CANDIDA TROPICALIS: NOT DETECTED
Candida albicans: NOT DETECTED
Candida parapsilosis: NOT DETECTED
ENTEROBACTER CLOACAE COMPLEX: NOT DETECTED
ESCHERICHIA COLI: NOT DETECTED
Enterobacteriaceae species: NOT DETECTED
Enterococcus species: NOT DETECTED
Haemophilus influenzae: NOT DETECTED
KLEBSIELLA PNEUMONIAE: NOT DETECTED
Klebsiella oxytoca: NOT DETECTED
Listeria monocytogenes: NOT DETECTED
NEISSERIA MENINGITIDIS: NOT DETECTED
PROTEUS SPECIES: NOT DETECTED
PSEUDOMONAS AERUGINOSA: NOT DETECTED
SERRATIA MARCESCENS: NOT DETECTED
STAPHYLOCOCCUS AUREUS BCID: NOT DETECTED
STAPHYLOCOCCUS SPECIES: NOT DETECTED
STREPTOCOCCUS PNEUMONIAE: NOT DETECTED
Streptococcus agalactiae: NOT DETECTED
Streptococcus pyogenes: NOT DETECTED
Streptococcus species: DETECTED — AB

## 2016-02-17 LAB — CBC WITH DIFFERENTIAL/PLATELET
BASOS PCT: 0 %
Basophils Absolute: 0 10*3/uL (ref 0.0–0.1)
EOS ABS: 0 10*3/uL (ref 0.0–0.7)
EOS PCT: 0 %
HEMATOCRIT: 29.9 % — AB (ref 36.0–46.0)
HEMOGLOBIN: 10.3 g/dL — AB (ref 12.0–15.0)
Lymphocytes Relative: 3 %
Lymphs Abs: 0.5 10*3/uL — ABNORMAL LOW (ref 0.7–4.0)
MCH: 27 pg (ref 26.0–34.0)
MCHC: 34.4 g/dL (ref 30.0–36.0)
MCV: 78.5 fL (ref 78.0–100.0)
Monocytes Absolute: 0.5 10*3/uL (ref 0.1–1.0)
Monocytes Relative: 3 %
NEUTROS ABS: 17.1 10*3/uL — AB (ref 1.7–7.7)
Neutrophils Relative %: 94 %
Platelets: 343 10*3/uL (ref 150–400)
RBC: 3.81 MIL/uL — ABNORMAL LOW (ref 3.87–5.11)
RDW: 14.3 % (ref 11.5–15.5)
WBC: 18.1 10*3/uL — ABNORMAL HIGH (ref 4.0–10.5)

## 2016-02-18 LAB — URINE CULTURE

## 2016-02-21 LAB — CULTURE, BLOOD (ROUTINE X 2)

## 2016-02-22 ENCOUNTER — Telehealth: Payer: Self-pay | Admitting: Emergency Medicine

## 2016-02-22 NOTE — Telephone Encounter (Signed)
Post ED Visit - Positive Culture Follow-up  Culture report reviewed by antimicrobial stewardship pharmacist:  []  Enzo BiNathan Batchelder, Pharm.D. []  Celedonio MiyamotoJeremy Frens, Pharm.D., BCPS []  Garvin FilaMike Maccia, Pharm.D. []  Georgina PillionElizabeth Martin, Pharm.D., BCPS []  ShinglehouseMinh Pham, [address]Ph[address]D., BCPS, AAHIVP []  Estella HuskMichelle Turner, Pharm.D., BCPS, AAHIVP []  Tennis Mustassie Stewart, Pharm.D. []  Sherle Poeob Vincent, [address]Ph[address] Joe Arminger PharmD  Positive blood culture  Patient admitted to [facility] Confirmed patient's room number 722 Faxed to [phone] attn Jerelene Reddenerry  Harvin Konicek 02/22/2016, 4:08 PM

## 2016-05-10 ENCOUNTER — Emergency Department (HOSPITAL_BASED_OUTPATIENT_CLINIC_OR_DEPARTMENT_OTHER)
Admission: EM | Admit: 2016-05-10 | Discharge: 2016-05-11 | Disposition: A | Discharge status: Home / Self Care | Payer: Medicare HMO | Attending: Emergency Medicine | Admitting: Emergency Medicine

## 2016-05-10 ENCOUNTER — Encounter (HOSPITAL_BASED_OUTPATIENT_CLINIC_OR_DEPARTMENT_OTHER): Payer: Self-pay | Admitting: *Deleted

## 2016-05-10 ENCOUNTER — Emergency Department (HOSPITAL_BASED_OUTPATIENT_CLINIC_OR_DEPARTMENT_OTHER): Payer: Medicare HMO

## 2016-05-10 DIAGNOSIS — I10 Essential (primary) hypertension: Secondary | ICD-10-CM | POA: Insufficient documentation

## 2016-05-10 DIAGNOSIS — E119 Type 2 diabetes mellitus without complications: Secondary | ICD-10-CM | POA: Insufficient documentation

## 2016-05-10 DIAGNOSIS — Z87891 Personal history of nicotine dependence: Secondary | ICD-10-CM | POA: Diagnosis not present

## 2016-05-10 DIAGNOSIS — R109 Unspecified abdominal pain: Secondary | ICD-10-CM

## 2016-05-10 DIAGNOSIS — Z794 Long term (current) use of insulin: Secondary | ICD-10-CM | POA: Insufficient documentation

## 2016-05-10 DIAGNOSIS — G8918 Other acute postprocedural pain: Secondary | ICD-10-CM | POA: Insufficient documentation

## 2016-05-10 DIAGNOSIS — R1084 Generalized abdominal pain: Secondary | ICD-10-CM | POA: Diagnosis present

## 2016-05-10 LAB — COMPREHENSIVE METABOLIC PANEL
ALBUMIN: 3.3 g/dL — AB (ref 3.5–5.0)
ALK PHOS: 80 U/L (ref 38–126)
ALT: 9 U/L — ABNORMAL LOW (ref 14–54)
ANION GAP: 7 (ref 5–15)
AST: 17 U/L (ref 15–41)
BILIRUBIN TOTAL: 0.4 mg/dL (ref 0.3–1.2)
BUN: 15 mg/dL (ref 6–20)
CALCIUM: 9 mg/dL (ref 8.9–10.3)
CO2: 28 mmol/L (ref 22–32)
Chloride: 98 mmol/L — ABNORMAL LOW (ref 101–111)
Creatinine, Ser: 0.84 mg/dL (ref 0.44–1.00)
GLUCOSE: 89 mg/dL (ref 65–99)
Potassium: 3.9 mmol/L (ref 3.5–5.1)
Sodium: 133 mmol/L — ABNORMAL LOW (ref 135–145)
Total Protein: 8.3 g/dL — ABNORMAL HIGH (ref 6.5–8.1)

## 2016-05-10 LAB — CBC WITH DIFFERENTIAL/PLATELET
Basophils Absolute: 0.1 10*3/uL (ref 0.0–0.1)
Basophils Relative: 1 %
Eosinophils Absolute: 0.2 10*3/uL (ref 0.0–0.7)
Eosinophils Relative: 2 %
HEMATOCRIT: 33.5 % — AB (ref 36.0–46.0)
HEMOGLOBIN: 11.2 g/dL — AB (ref 12.0–15.0)
LYMPHS PCT: 37 %
Lymphs Abs: 3.5 10*3/uL (ref 0.7–4.0)
MCH: 25.8 pg — AB (ref 26.0–34.0)
MCHC: 33.4 g/dL (ref 30.0–36.0)
MCV: 77.2 fL — AB (ref 78.0–100.0)
MONO ABS: 1 10*3/uL (ref 0.1–1.0)
MONOS PCT: 10 %
NEUTROS ABS: 4.6 10*3/uL (ref 1.7–7.7)
NEUTROS PCT: 50 %
Platelets: 577 10*3/uL — ABNORMAL HIGH (ref 150–400)
RBC: 4.34 MIL/uL (ref 3.87–5.11)
RDW: 17.8 % — AB (ref 11.5–15.5)
WBC: 9.3 10*3/uL (ref 4.0–10.5)

## 2016-05-10 LAB — URINALYSIS, DIPSTICK ONLY
Bilirubin Urine: NEGATIVE
Glucose, UA: NEGATIVE mg/dL
Hgb urine dipstick: NEGATIVE
KETONES UR: 15 mg/dL — AB
LEUKOCYTES UA: NEGATIVE
NITRITE: NEGATIVE
PH: 6 (ref 5.0–8.0)
Protein, ur: 100 mg/dL — AB
Specific Gravity, Urine: 1.023 (ref 1.005–1.030)

## 2016-05-10 LAB — PREGNANCY, URINE: Preg Test, Ur: NEGATIVE

## 2016-05-10 MED ORDER — HYDROMORPHONE HCL 1 MG/ML IJ SOLN
1.0000 mg | Freq: Once | INTRAMUSCULAR | Status: AC
  Administered 2016-05-10: 1 mg via INTRAVENOUS
  Filled 2016-05-10: qty 1

## 2016-05-10 NOTE — ED Provider Notes (Signed)
[facility] Provider Note: Cheryl Dell, MD, FACEP  By signing my name below, I, Cheryl Stokes, attest that this documentation has been prepared under the direction and in the presence of [name], MD. Electronically Signed: Doreatha Stokes, ED Scribe. 05/10/16. 11:45 PM.    CSN: [id number] MRN: [id number] ARRIVAL: 05/10/16 at 2032 ROOM: MH09/MH09   CHIEF COMPLAINT  Abdominal Pain   HISTORY OF PRESENT ILLNESS  Cheryl Stokes is a 41 y.o. 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 She was last seen by her surgeon, [name] with [facility], on 05/02/16 whose note reports that she was feeling well and asymptomatic at that time.  She presents to the Emergency Department complaining of severe, waxing and waning generalized abdominal pain (worse at LUQ) that has been recurring for about the past month. Pt reports associated intermittent nausea and vomiting. She states she is not tolerating food or fluids. She has tried Zofran, scopolamine patches and 10 mg Oxycodone with inadequate relief of pain and nausea.   She has not contacted her surgeon regarding her recent increase in pain.    Past Medical History:  Diagnosis Date  . Diabetes mellitus without complication (HCC)   . Hypertension   . Sleep apnea     Past Surgical History:  Procedure Laterality Date  . BARIATRIC SURGERY  03/2015   Sleve  . TONSILLECTOMY      History reviewed. No pertinent family history.  Social History  Substance Use Topics  . Smoking status: Former Smoker    Packs/day: 0.00  . Smokeless tobacco: Never Used  . Alcohol use No    Prior to Admission medications   Medication Sig Start Date End Date Taking? Authorizing Provider  acetaminophen (TYLENOL) 325 MG tablet Take 2 tablets  (650 mg total) by mouth every 6 (six) hours as needed for moderate pain. 02/11/16   [name], MD  Atorvastatin Calcium (LIPITOR PO) Take by mouth.    Historical Provider, MD  benzonatate (TESSALON) 100 MG capsule Take 1 capsule (100 mg total) by mouth every 8 (eight) hours. 02/11/16   [name], MD  calcium-vitamin D (OSCAL WITH D) 250-125 MG-UNIT tablet Take 1 tablet by mouth daily.    Historical Provider, MD  Canagliflozin-Metformin HCl (INVOKAMET PO) Take by mouth.    Historical Provider, MD  clonazePAM (KLONOPIN) 1 MG tablet Take 1 mg by mouth 2 (two) times daily.    Historical Provider, MD  Empagliflozin (JARDIANCE PO) Take by mouth.    Historical Provider, MD  Fluconazole (DIFLUCAN PO) Take by mouth.    Historical Provider, MD  ibuprofen (ADVIL,MOTRIN) 800 MG tablet Take 800 mg by mouth every 8 (eight) hours as needed.    Historical Provider, MD  insulin lispro protamine-lispro (HUMALOG 75/25 MIX) (75-25) 100 UNIT/ML SUSP injection Inject into the skin.    Historical Provider, MD  LOSARTAN POTASSIUM PO Take by mouth.    Historical Provider, MD  methocarbamol (ROBAXIN) 500 MG tablet Take 1 tablet (500 mg total) by mouth 2 (two) times daily. 02/11/16   [name], MD  Multiple Vitamin (MULTIVITAMIN) tablet Take 1 tablet by mouth daily.    Historical Provider, MD  NYSTATIN PO Take by mouth.    Historical Provider, MD  ondansetron (ZOFRAN ODT) 4 MG disintegrating tablet  ODT q4 hours  prn nausea/vomit 02/11/16   [name], MD  Oxycodone-Acetaminophen (PERCOCET PO) Take by mouth.    Historical Provider, MD    Allergies Metformin and related and Mobic [meloxicam]   REVIEW OF SYSTEMS  Negative except as noted here or in the History of Present Illness.   PHYSICAL EXAMINATION  Initial Vital Signs Blood pressure (!) 157/98, pulse 80, temperature 99 F (37.2 C), resp. rate 16, height  (1.753 m), weight 153 lb (69.4 kg), SpO2 99 %.  Examination General: Well-developed,  well-nourished female in no acute distress; appearance consistent with age of record HENT: normocephalic; atraumatic Eyes: pupils equal, round and reactive to light; extraocular muscles intact Neck: supple Heart: regular rate and rhythm Lungs: clear to auscultation bilaterally Abdomen: soft; nondistended; diffuse tenderness; no masses or hepatosplenomegaly; bowel sounds present Extremities: No deformity; full range of motion; pulses normal Neurologic: Awake, alert and oriented; motor function intact in all extremities and symmetric; no facial droop Skin: Warm and dry Psychiatric: Flat affect   RESULTS  Summary of this visit's results, reviewed by myself:   EKG Interpretation  Date/Time:    Ventricular Rate:    PR Interval:    QRS Duration:   QT Interval:    QTC Calculation:   R Axis:     Text Interpretation:        Laboratory Studies: Results for orders placed or performed during the hospital encounter of 05/10/16 (from the past 24 hour(s))  Pregnancy, urine     Status: None   Collection Time: 05/10/16  9:59 PM  Result Value Ref Range   Preg Test, Ur NEGATIVE NEGATIVE  Urinalysis, dipstick only     Status: Abnormal   Collection Time: 05/10/16  9:59 PM  Result Value Ref Range   Color, Urine AMBER (A) YELLOW   APPearance CLOUDY (A) CLEAR   Specific Gravity, Urine 1.023 1.005 - 1.030   pH 6.0 5.0 - 8.0   Glucose, UA NEGATIVE NEGATIVE mg/dL   Hgb urine dipstick NEGATIVE NEGATIVE   Bilirubin Urine NEGATIVE NEGATIVE   Ketones, ur 15 (A) NEGATIVE mg/dL   Protein, ur 161 (A) NEGATIVE mg/dL   Nitrite NEGATIVE NEGATIVE   Leukocytes, UA NEGATIVE NEGATIVE  CBC with Differential     Status: Abnormal   Collection Time: 05/10/16 10:40 PM  Result Value Ref Range   WBC 9.3 4.0 - 10.5 K/uL   RBC 4.34 3.87 - 5.11 MIL/uL   Hemoglobin 11.2 (L) 12.0 - 15.0 g/dL   HCT 09.6 (L) 04.5 - 40.9 %   MCV 77.2 (L) 78.0 - 100.0 fL   MCH 25.8 (L) 26.0 - 34.0 pg   MCHC 33.4 30.0 - 36.0 g/dL    RDW 81.1 (H) 91.4 - 15.5 %   Platelets 577 (H) 150 - 400 K/uL   Neutrophils Relative % 50 %   Neutro Abs 4.6 1.7 - 7.7 K/uL   Lymphocytes Relative 37 %   Lymphs Abs 3.5 0.7 - 4.0 K/uL   Monocytes Relative 10 %   Monocytes Absolute 1.0 0.1 - 1.0 K/uL   Eosinophils Relative 2 %   Eosinophils Absolute 0.2 0.0 - 0.7 K/uL   Basophils Relative 1 %   Basophils Absolute 0.1 0.0 - 0.1 K/uL  Comprehensive metabolic panel     Status: Abnormal   Collection Time: 05/10/16 10:40 PM  Result Value Ref Range   Sodium 133 (L) 135 - 145 mmol/L   Potassium 3.9 3.5 - 5.1 mmol/L   Chloride 98 (L) 101 -  111 mmol/L   CO2 28 22 - 32 mmol/L   Glucose, Bld 89 65 - 99 mg/dL   BUN 15 6 - 20 mg/dL   Creatinine, Ser 1.61 0.44 - 1.00 mg/dL   Calcium 9.0 8.9 - 09.6 mg/dL   Total Protein 8.3 (H) 6.5 - 8.1 g/dL   Albumin 3.3 (L) 3.5 - 5.0 g/dL   AST 17 15 - 41 U/L   ALT 9 (L) 14 - 54 U/L   Alkaline Phosphatase 80 38 - 126 U/L   Total Bilirubin 0.4 0.3 - 1.2 mg/dL   GFR calc non Af Amer >60 >60 mL/min   GFR calc Af Amer >60 >60 mL/min   Anion gap 7 5 - 15   Imaging Studies: Ct Abdomen Pelvis W Contrast  Result Date: 05/11/2016 CLINICAL DATA:  Left upper quadrant pain x1 month. History of gastric fistula, small gastric perforation and fistulous tract in the spleen, severely abscess spleen with splenectomy 2/18, fistula repair 3/18 with stents placed. EXAM: CT ABDOMEN AND PELVIS WITH CONTRAST TECHNIQUE: Multidetector CT imaging of the abdomen and pelvis was performed using the standard protocol following bolus administration of intravenous contrast. CONTRAST:  ISOVUE-300 IOPAMIDOL (ISOVUE-300) INJECTION 61% COMPARISON:  None. FINDINGS: Lower chest: No acute abnormality. Clearing of small left effusion since prior exam. Hepatobiliary: No focal liver abnormality is seen. No gallstones, gallbladder wall thickening, or biliary dilatation. Pancreas: Unremarkable. No pancreatic ductal dilatation or surrounding  inflammatory changes. Spleen: Surgically absent with what appear to be small splenules in the left upper quadrant adjacent to gastric fistulous repair with streaky parenchymal areas of presumed scarring. No recurrent fluid collection as on prior. No free flowing contrast extravasation is identified. Adrenals/Urinary Tract: Adrenal glands are unremarkable. Kidneys are normal, without renal calculi, focal lesion, or hydronephrosis. Bladder is unremarkable. Stomach/Bowel: Esophagus is normal. Bariatric surgical staple line is seen along the greater curvature. Endoluminal stents are seen within the stomach with contrast seen within. A small amount of enteric contrast is seen lateral to the indwelling stents but appear to be contained within the native stomach wall. A few times foci are seen in the site of prior fluid cavity which may represent change status post fistulous repair. This does not appear to be contiguous with the stomach to suggest a recurrent fistula. Contrast proceeds into small intestine without obstruction. Moderate colonic stool burden is seen. Normal-appearing appendix. Vascular/Lymphatic: No significant vascular findings are present. No enlarged abdominal or pelvic lymph nodes. Reproductive: Uterus and bilateral adnexa are unremarkable. Other: Small amount of free fluid in the pelvis. Musculoskeletal: No acute or significant osseous findings. IMPRESSION: Status post splenectomy and repair of gastric fistula. No free flowing contrast extravasation is identified to suggest recurrent leak. If this is still of clinical concern, an upper GI study with contrast may for better assessment. Indwelling stents are noted within the stomach status post bariatric surgery. Contrast proceeds into small intestine without obstruction. Electronically Signed   By: Tollie Eth M.D.   On: 05/11/2016 02:23    ED COURSE  Nursing notes and initial vitals signs, including pulse oximetry, reviewed.  Vitals:   05/10/16  2038 05/10/16 2040 05/10/16 2252 05/11/16 0142  BP:  (!) 149/110 (!) 157/98 (!) 172/93  Pulse:  94 80 77  Resp:  Temp:  99 F (37.2 C)    SpO2:  100% 99% 100%  Weight: 153 lb (69.4 kg)     Height:  (1.753 m)  2:44 AM Pain has improved. She has not been vomiting in the ED. She was advised to reassuring CT scan. We will provide her a copy of the CT scan and take back to her surgeon. She was advised to call him later today to report her symptoms.  PROCEDURES    ED DIAGNOSES     ICD-9-CM ICD-10-CM   1. Postoperative abdominal pain 789.00 R10.9    338.18 G89.18      I personally performed the services described in this documentation, which was scribed in my presence. The recorded information has been reviewed and is accurate.    [name], MD 05/11/16 [phone]

## 2016-05-10 NOTE — ED Triage Notes (Signed)
Pt c/o "severe" abd pain x 1 month

## 2016-05-10 NOTE — ED Notes (Signed)
abd pain w n/v x 1 month,  States zofran approx 45 minutes ago

## 2016-05-11 DIAGNOSIS — G8918 Other acute postprocedural pain: Secondary | ICD-10-CM | POA: Diagnosis not present

## 2016-05-11 MED ORDER — IOPAMIDOL (ISOVUE-300) INJECTION 61%
100.0000 mL | Freq: Once | INTRAVENOUS | Status: AC | PRN
  Administered 2016-05-11: 100 mL via INTRAVENOUS

## 2016-05-11 MED ORDER — ONDANSETRON HCL 4 MG/2ML IJ SOLN
4.0000 mg | Freq: Once | INTRAMUSCULAR | Status: AC
  Administered 2016-05-11: 4 mg via INTRAVENOUS
  Filled 2016-05-11: qty 2

## 2016-05-11 MED ORDER — HYDROMORPHONE HCL 1 MG/ML IJ SOLN
1.0000 mg | Freq: Once | INTRAMUSCULAR | Status: AC
  Administered 2016-05-11: 1 mg via INTRAVENOUS
  Filled 2016-05-11: qty 1

## 2016-07-29 ENCOUNTER — Encounter (HOSPITAL_BASED_OUTPATIENT_CLINIC_OR_DEPARTMENT_OTHER): Payer: Self-pay

## 2016-07-29 ENCOUNTER — Emergency Department (HOSPITAL_BASED_OUTPATIENT_CLINIC_OR_DEPARTMENT_OTHER)
Admission: EM | Admit: 2016-07-29 | Discharge: 2016-07-29 | Disposition: A | Discharge status: Home / Self Care | Payer: Medicare HMO | Attending: Emergency Medicine | Admitting: Emergency Medicine

## 2016-07-29 DIAGNOSIS — E119 Type 2 diabetes mellitus without complications: Secondary | ICD-10-CM | POA: Insufficient documentation

## 2016-07-29 DIAGNOSIS — Z7984 Long term (current) use of oral hypoglycemic drugs: Secondary | ICD-10-CM | POA: Insufficient documentation

## 2016-07-29 DIAGNOSIS — M549 Dorsalgia, unspecified: Secondary | ICD-10-CM | POA: Insufficient documentation

## 2016-07-29 DIAGNOSIS — S39012A Strain of muscle, fascia and tendon of lower back, initial encounter: Secondary | ICD-10-CM

## 2016-07-29 DIAGNOSIS — I10 Essential (primary) hypertension: Secondary | ICD-10-CM | POA: Diagnosis not present

## 2016-07-29 DIAGNOSIS — Z87891 Personal history of nicotine dependence: Secondary | ICD-10-CM | POA: Diagnosis not present

## 2016-07-29 MED ORDER — CYCLOBENZAPRINE HCL 10 MG PO TABS: 10.0000 mg | ORAL_TABLET | Freq: Three times a day (TID) | ORAL | 0 refills | Status: DC | PRN

## 2016-07-29 MED ORDER — PREDNISONE 10 MG PO TABS: 20.0000 mg | ORAL_TABLET | Freq: Two times a day (BID) | ORAL | 0 refills | Status: DC

## 2016-07-29 NOTE — ED Triage Notes (Signed)
Pt reports back pain x 2 days. Hurts while at work. Hx of degenerative disc.

## 2016-07-29 NOTE — ED Notes (Signed)
ED Provider at bedside. 

## 2016-07-29 NOTE — ED Provider Notes (Signed)
[facility]   CSN: [id number] Arrival date & time: 07/29/16  1611  By signing my name below, I, Cheryl Stokes, attest that this documentation has been prepared under the direction and in the presence of Geoffery Lyonselo, Sharlie Shreffler, MD. Electronically Signed: Rosana Fretana Stokes, ED Scribe. 07/29/16. 5:12 PM.  History   Chief Complaint Chief Complaint  Patient presents with  . Back Pain   The history is provided by the patient. No language interpreter was used.  Back Pain   This is a chronic problem. The current episode started 2 days ago. The problem occurs constantly. The problem has not changed since onset.The pain is associated with no known injury. The pain is present in the lumbar spine. The pain is moderate.   HPI Comments: Cheryl Stokes is a 41 y.o. female with a PMHx of DDD and DM, who presents to the Emergency Department complaining of constant, right-sided middle back pain onset 2 days ago. Pt endorses a hx of chronic back pain for 10-15 years. No new injuries or trauma. Pt reports associated vomiting yesterday. Pt has tried her oxycodone for her chronic back pain. Pt denies urinary/bowel incontinence or any other complaints at this time.  Past Medical History:  Diagnosis Date  . Diabetes mellitus without complication (HCC)   . Hypertension   . Sleep apnea     There are no active problems to display for this patient.   Past Surgical History:  Procedure Laterality Date  . BARIATRIC SURGERY  03/2015   Sleve  . TONSILLECTOMY      OB History    No data available       Home Medications    Prior to Admission medications   Medication Sig Start Date End Date Taking? Authorizing Provider  acetaminophen (TYLENOL) 325 MG tablet Take 2 tablets (650 mg total) by mouth every 6 (six) hours as needed for moderate pain. 02/11/16   Mesner, Barbara CowerJason, MD  Atorvastatin Calcium (LIPITOR PO) Take by mouth.    [provider]  benzonatate (TESSALON) 100 MG  capsule Take 1 capsule (100 mg total) by mouth every 8 (eight) hours. 02/11/16   Mesner, Barbara CowerJason, MD  calcium-vitamin D (OSCAL WITH D) 250-125 MG-UNIT tablet Take 1 tablet by mouth daily.    [provider]  Canagliflozin-Metformin HCl (INVOKAMET PO) Take by mouth.    [provider]  clonazePAM (KLONOPIN) 1 MG tablet Take 1 mg by mouth 2 (two) times daily.    [provider]  Empagliflozin (JARDIANCE PO) Take by mouth.    [provider]  Fluconazole (DIFLUCAN PO) Take by mouth.    [provider]  ibuprofen (ADVIL,MOTRIN) 800 MG tablet Take 800 mg by mouth every 8 (eight) hours as needed.    [provider]  insulin lispro protamine-lispro (HUMALOG 75/25 MIX) (75-25) 100 UNIT/ML SUSP injection Inject into the skin.    [provider]  LOSARTAN POTASSIUM PO Take by mouth.    [provider]  methocarbamol (ROBAXIN) 500 MG tablet Take 1 tablet (500 mg total) by mouth 2 (two) times daily. 02/11/16   Mesner, Barbara CowerJason, MD  Multiple Vitamin (MULTIVITAMIN) tablet Take 1 tablet by mouth daily.    [provider]  NYSTATIN PO Take by mouth.    [provider]  ondansetron (ZOFRAN ODT) 4 MG disintegrating tablet 4mg  ODT q4 hours prn nausea/vomit 02/11/16   Mesner, Barbara CowerJason, MD  Oxycodone-Acetaminophen (PERCOCET PO) Take by mouth.    [provider]  Family History No family history on file.  Social History Social History  Substance Use Topics  . Smoking status: Former Smoker    Packs/day: 0.00  . Smokeless tobacco: Never Used  . Alcohol use No     Allergies   Metformin and related and Mobic [meloxicam]   Review of Systems Review of Systems  Gastrointestinal: Positive for vomiting.  Genitourinary: Negative for enuresis.  Musculoskeletal: Positive for back pain and myalgias.  All other systems reviewed and are negative.    Physical Exam Updated Vital Signs BP (!) 125/92 (BP Location: Left Arm)    Pulse 89   Temp 98.9 F (37.2 C) (Oral)   Resp 16   SpO2 100%   Physical Exam  Constitutional: She is oriented to person, place, and time. She appears well-developed and well-nourished.  HENT:  Head: Normocephalic.  Eyes: EOM are normal.  Neck: Normal range of motion.  Pulmonary/Chest: Effort normal.  Abdominal: She exhibits no distension.  Musculoskeletal: Normal range of motion. She exhibits tenderness.  TTP in the soft tissue of the right lower lumbar region. No bony tenderness.   Neurological: She is alert and oriented to person, place, and time. Coordination normal.  DTRs are 1+ and symmetrical in the patellar and the achilles tendons bilaterally. Strength is 5/5 in both lower extremities. Ambulates without difficulty.   Psychiatric: She has a normal mood and affect.  Nursing note and vitals reviewed.    ED Treatments / Results  DIAGNOSTIC STUDIES: Oxygen Saturation is 100% on RA, normal by my interpretation.   COORDINATION OF CARE: 5:08 PM-Discussed next steps with pt including treatment with a muscle relaxant and an antiinflammatory. Pt verbalized understanding and is agreeable with the plan.   Labs (all labs ordered are listed, but only abnormal results are displayed) Labs Reviewed - No data to display  EKG  EKG Interpretation None       Radiology No results found.  Procedures Procedures (including critical care time)  Medications Ordered in ED Medications - No data to display   Initial Impression / Assessment and Plan / ED Course  I have reviewed the triage vital signs and the nursing notes.  Pertinent labs & imaging results that were available during my care of the patient were reviewed by me and considered in my medical decision making (see chart for details).    Patient with back pain.  No neurological deficits and normal neuro exam.  Patient is ambulatory.  No loss of bowel or bladder control.  No concern for cauda equina.  No fever, night  sweats, weight loss, h/o cancer, IVDA, no recent procedure to back. No urinary symptoms suggestive of UTI.  Supportive care and return precaution discussed. Appears safe for discharge at this time. Follow up as indicated in discharge paperwork.    Final Clinical Impressions(s) / ED Diagnoses   Final diagnoses:  None    New Prescriptions New Prescriptions   No medications on file   I personally performed the services described in this documentation, which was scribed in my presence. The recorded information has been reviewed and is accurate.        Geoffery Lyons, MD 07/29/16 2259

## 2016-07-29 NOTE — ED Notes (Signed)
Pt discharged to home with family. NAD.  

## 2016-07-29 NOTE — Discharge Instructions (Signed)
Prednisone as prescribed.  Continue your oxycodone as needed for pain. Take Flexeril as needed for pain not relieved with oxycodone.  Follow-up with your primary Dr. if you're not improving in the next week

## 2016-08-01 ENCOUNTER — Emergency Department (HOSPITAL_BASED_OUTPATIENT_CLINIC_OR_DEPARTMENT_OTHER): Payer: Medicare HMO

## 2016-08-01 ENCOUNTER — Encounter (HOSPITAL_BASED_OUTPATIENT_CLINIC_OR_DEPARTMENT_OTHER): Payer: Self-pay | Admitting: *Deleted

## 2016-08-01 ENCOUNTER — Emergency Department (HOSPITAL_BASED_OUTPATIENT_CLINIC_OR_DEPARTMENT_OTHER)
Admission: EM | Admit: 2016-08-01 | Discharge: 2016-08-01 | Disposition: A | Discharge status: Home / Self Care | Payer: Medicare HMO | Attending: Emergency Medicine | Admitting: Emergency Medicine

## 2016-08-01 DIAGNOSIS — M545 Low back pain: Secondary | ICD-10-CM | POA: Diagnosis not present

## 2016-08-01 DIAGNOSIS — I10 Essential (primary) hypertension: Secondary | ICD-10-CM | POA: Diagnosis not present

## 2016-08-01 DIAGNOSIS — Z87891 Personal history of nicotine dependence: Secondary | ICD-10-CM | POA: Diagnosis not present

## 2016-08-01 DIAGNOSIS — E119 Type 2 diabetes mellitus without complications: Secondary | ICD-10-CM | POA: Insufficient documentation

## 2016-08-01 DIAGNOSIS — Z79899 Other long term (current) drug therapy: Secondary | ICD-10-CM | POA: Diagnosis not present

## 2016-08-01 DIAGNOSIS — Z7984 Long term (current) use of oral hypoglycemic drugs: Secondary | ICD-10-CM | POA: Diagnosis not present

## 2016-08-01 LAB — PREGNANCY, URINE: PREG TEST UR: NEGATIVE

## 2016-08-01 MED ORDER — METHOCARBAMOL 500 MG PO TABS: 500.0000 mg | ORAL_TABLET | Freq: Two times a day (BID) | ORAL | 0 refills | Status: DC

## 2016-08-01 MED ORDER — HYDROMORPHONE HCL 1 MG/ML IJ SOLN
1.0000 mg | Freq: Once | INTRAMUSCULAR | Status: AC
  Administered 2016-08-01: 1 mg via INTRAMUSCULAR
  Filled 2016-08-01: qty 1
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
## 2016-08-01 NOTE — Discharge Instructions (Signed)
Take home medications as previously prescribed. Take robaxin as directed. Follow-up with PCP for further evaluation. Return to ED for additional injury, trouble walking, numbness, weakness, head injury, loss of consciousness.
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
## 2017-01-04 ENCOUNTER — Other Ambulatory Visit: Payer: Self-pay

## 2017-01-04 ENCOUNTER — Emergency Department (HOSPITAL_COMMUNITY)
Admission: EM | Admit: 2017-01-04 | Discharge: 2017-01-04 | Disposition: A | Discharge status: Home / Self Care | Payer: Medicare HMO | Attending: Emergency Medicine | Admitting: Emergency Medicine

## 2017-01-04 ENCOUNTER — Encounter (HOSPITAL_COMMUNITY): Payer: Self-pay | Admitting: Emergency Medicine

## 2017-01-04 DIAGNOSIS — Z5321 Procedure and treatment not carried out due to patient leaving prior to being seen by health care provider: Secondary | ICD-10-CM | POA: Insufficient documentation

## 2017-01-04 DIAGNOSIS — R51 Headache: Secondary | ICD-10-CM | POA: Insufficient documentation

## 2017-01-04 LAB — CBG MONITORING, ED: Glucose-Capillary: 92 mg/dL (ref 65–99)

## 2017-01-04 NOTE — ED Triage Notes (Addendum)
Pt st's she fell over a dog gate but thinks she was lightheaded before she fell.  Pt c/o pain in neck, back and now has a migraine headache.  Pt st's she thinks her blood sugar may be low.

## 2017-01-04 NOTE — ED Notes (Signed)
Pts name called for a room no answer 

## 2017-11-18 ENCOUNTER — Emergency Department (HOSPITAL_COMMUNITY)
Admission: EM | Admit: 2017-11-18 | Discharge: 2017-11-18 | Disposition: A | Discharge status: Home / Self Care | Payer: Medicare HMO | Attending: Emergency Medicine | Admitting: Emergency Medicine

## 2017-11-18 ENCOUNTER — Encounter (HOSPITAL_COMMUNITY): Payer: Self-pay

## 2017-11-18 ENCOUNTER — Other Ambulatory Visit: Payer: Self-pay

## 2017-11-18 DIAGNOSIS — Z79899 Other long term (current) drug therapy: Secondary | ICD-10-CM | POA: Insufficient documentation

## 2017-11-18 DIAGNOSIS — L0201 Cutaneous abscess of face: Secondary | ICD-10-CM | POA: Insufficient documentation

## 2017-11-18 DIAGNOSIS — L0291 Cutaneous abscess, unspecified: Secondary | ICD-10-CM

## 2017-11-18 DIAGNOSIS — E119 Type 2 diabetes mellitus without complications: Secondary | ICD-10-CM | POA: Diagnosis not present

## 2017-11-18 DIAGNOSIS — I10 Essential (primary) hypertension: Secondary | ICD-10-CM | POA: Insufficient documentation

## 2017-11-18 DIAGNOSIS — Z87891 Personal history of nicotine dependence: Secondary | ICD-10-CM | POA: Diagnosis not present

## 2017-11-18 DIAGNOSIS — R600 Localized edema: Secondary | ICD-10-CM | POA: Diagnosis present

## 2017-11-18 MED ORDER — CLINDAMYCIN HCL 150 MG PO CAPS: 300.0000 mg | ORAL_CAPSULE | Freq: Three times a day (TID) | ORAL | 0 refills | Status: AC

## 2017-11-18 MED ORDER — OXYCODONE-ACETAMINOPHEN 5-325 MG PO TABS
1.0000 | ORAL_TABLET | Freq: Once | ORAL | Status: AC
  Administered 2017-11-18: 1 via ORAL
  Filled 2017-11-18: qty 1

## 2017-11-18 NOTE — ED Notes (Signed)
Patient talking on phone upon room being entered

## 2017-11-18 NOTE — Discharge Instructions (Signed)
You were evaluated today for sore on your lip . This is most likely MRSA. I will prescribe you Clindamycin. Please take as prescribed. You may also use a warm compress on your lip. You may take Tylenol as needed for pain. Please follow-up with your PCP for re-evaluation.  Return to the ED with any new or worsening symptoms

## 2017-11-18 NOTE — ED Triage Notes (Addendum)
Per EMS, Pt, from home, presents with multiple complaints.  Initally, Pt complained of "painful" bump on lip x 2 days and weakness/cold symptoms x 1 week.  Pain score 10/10.  Pt reports applying Blistex to "bump" w/o relief.    Upon ED Tech entering room, Pt began complaining of a headache too.   Pt adds "severe" migraine and generalized body aches x 2-3 days.  Sts she is "getting over the flu."   Sts Hx of splenectomy.

## 2017-11-18 NOTE — ED Provider Notes (Signed)
[facility] Provider Note   CSN: [id number] Arrival date & time: 11/18/17  0547  History   Chief Complaint Chief Complaint  Patient presents with  . Sore on lip  . Headache  . Generalized Body Aches    HPI Cheryl Stokes is a 42 y.o. female with past medical history significant for gastric sleeve, HTN, splenectomy who presents for evaluation of sore. Patient states that she noticed a painful raised bump to the top portion of her lip approximately 2 days ago. States she has been placing Blistex to this area without relief of symptoms. States that she awoke this morning and the pain has spread to the top portion of the left side of her lip. Rates her pain a 9/10. Denies aggravating or alleviating symptoms.  Denies fever, chills, nausea, vomiting, chest pain, SOB, abdominal pain, diarrhea, weakness, slurred speach. Admits to "headache" which she states is located to her lip sore.  History obtained from patient. No interpretors was used.  HPI  Past Medical History:  Diagnosis Date  . Diabetes mellitus without complication (HCC)   . Hypertension   . Sleep apnea     There are no active problems to display for this patient.   Past Surgical History:  Procedure Laterality Date  . BARIATRIC SURGERY  03/2015   Sleve  . SPLENECTOMY    . TONSILLECTOMY       OB History   None      Home Medications    Prior to Admission medications   Medication Sig Start Date End Date Taking? Authorizing Provider  amLODipine (NORVASC) 10 MG tablet Take 10 mg by mouth daily as needed (blood pressure).    Yes [provider]  atorvastatin (LIPITOR) 20 MG tablet Take 20 mg by mouth daily.  03/20/16  Yes [provider]  Calcium Carb-Ergocalciferol 250-125 MG-UNIT TABS Take 1 tablet by mouth daily.  04/13/16  Yes [provider]  clonazePAM (KLONOPIN) 1 MG tablet Take 1 mg by mouth 2 (two) times daily.   Yes [provider]    EPINEPHrine 0.3 mg/0.3 mL IJ SOAJ injection Inject 0.3 mg into the muscle once as needed (Allergies).  12/09/13  Yes [provider]  ferrous sulfate 325 (65 FE) MG tablet Take 325 mg by mouth daily.  03/20/16  Yes [provider]  Fluconazole (DIFLUCAN PO) Take 150 mg by mouth once.    Yes [provider]  ibuprofen (ADVIL,MOTRIN) 800 MG tablet Take 800 mg by mouth every 8 (eight) hours as needed for mild pain.    Yes [provider]  methocarbamol (ROBAXIN) 500 MG tablet Take 1 tablet (500 mg total) by mouth 2 (two) times daily. Patient taking differently: Take 500 mg by mouth 2 (two) times daily as needed for muscle spasms.  08/01/16  Yes Khatri, Hina, PA-C  Multiple Vitamin (MULTIVITAMIN) tablet Take 1 tablet by mouth daily.   Yes [provider]  nystatin (MYCOSTATIN/NYSTOP) powder Apply 1 g topically daily as needed (dry skin).  11/04/17  Yes [provider]  Oxycodone HCl 10 MG TABS Take 10 mg by mouth 4 (four) times daily as needed (pain/pinched nerves).  06/21/14  Yes [provider]  pantoprazole (PROTONIX) 40 MG tablet Take 40 mg by mouth 2 (two) times daily. 11/07/17  Yes [provider]  pregabalin (LYRICA) 150 MG capsule Take 150 mg by mouth 3 (three) times daily as needed (pinched nerves).    Yes [provider]  promethazine (  PHENERGAN) 25 MG tablet Take 25 mg by mouth every 6 (six) hours as needed. 11/07/17  Yes [provider]  vitamin B-12 (CYANOCOBALAMIN) 1000 MCG tablet Take 1,000 mcg by mouth daily.  04/13/16  Yes [provider]  acetaminophen (TYLENOL) 325 MG tablet Take 2 tablets (650 mg total) by mouth every 6 (six) hours as needed for moderate pain. Patient not taking: Reported on 11/18/2017 02/11/16   Mesner, Barbara Cower, MD  benzonatate (TESSALON) 100 MG capsule Take 1 capsule (100 mg total) by mouth every 8 (eight) hours. Patient not taking: Reported on 11/18/2017 02/11/16   Mesner,  Barbara Cower, MD  calcium-vitamin D (OSCAL WITH D) 250-125 MG-UNIT tablet Take 1 tablet by mouth daily.    [provider]  Canagliflozin-Metformin HCl (INVOKAMET PO) Take by mouth.    [provider]  clindamycin (CLEOCIN) 150 MG capsule Take 2 capsules (300 mg total) by mouth 3 (three) times daily for 5 days. 11/18/17 11/23/17  Tayden Duran A, PA-C  cyclobenzaprine (FLEXERIL) 10 MG tablet Take 1 tablet (10 mg total) by mouth 3 (three) times daily as needed for muscle spasms. Patient not taking: Reported on 11/18/2017 07/29/16   Geoffery Lyons, MD  ondansetron (ZOFRAN ODT) 4 MG disintegrating tablet 4mg  ODT q4 hours prn nausea/vomit Patient not taking: Reported on 11/18/2017 02/11/16   Mesner, Barbara Cower, MD  predniSONE (DELTASONE) 10 MG tablet Take 2 tablets (20 mg total) by mouth 2 (two) times daily. Patient not taking: Reported on 11/18/2017 07/29/16   Geoffery Lyons, MD  Vitamin D, Ergocalciferol, (DRISDOL) 50000 units CAPS capsule Take 50,000 Units by mouth every 7 (seven) days.     [provider]    Family History History reviewed. No pertinent family history.  Social History Social History   Tobacco Use  . Smoking status: Former Smoker    Packs/day: 0.00  . Smokeless tobacco: Never Used  Substance Use Topics  . Alcohol use: No  . Drug use: No     Allergies   Bee venom; Tomato; Metformin and related; and Mobic [meloxicam]   Review of Systems Review of Systems  Constitutional: Negative.   HENT: Negative for congestion, dental problem, drooling, postnasal drip, rhinorrhea, sinus pressure, sinus pain, sore throat, tinnitus, trouble swallowing and voice change.        Left upper lip pain  Respiratory: Negative.   Cardiovascular: Negative.   Gastrointestinal: Negative.   Genitourinary: Negative.   Musculoskeletal: Negative.   Skin: Negative.   Neurological: Negative.   All other systems reviewed and are negative.    Physical Exam Updated Vital Signs BP  (!) 139/95   Pulse 95   Temp 97.9 F (36.6 C)   Resp 16   Ht 5\' 9"  (1.753 m)   Wt 72.6 kg   LMP 10/18/2017   SpO2 98%   BMI 23.63 kg/m   Physical Exam  Constitutional: She appears well-developed and well-nourished.  Non-toxic appearance. She does not appear ill. No distress.  HENT:  Head: Atraumatic.  Nose: Nose normal. Right sinus exhibits no maxillary sinus tenderness and no frontal sinus tenderness. Left sinus exhibits no maxillary sinus tenderness and no frontal sinus tenderness.  Mouth/Throat: Uvula is midline, oropharynx is clear and moist and mucous membranes are normal. She has dentures. No oral lesions. No trismus in the jaw. Abnormal dentition. Dental caries present. No dental abscesses, uvula swelling or lacerations. No oropharyngeal exudate, posterior oropharyngeal edema, posterior oropharyngeal erythema or tonsillar abscesses. No tonsillar exudate.    Erythema and area  with mild fluctuance to upper lip just inferior to left lateral nose. Does not extent into the nasal ridge or philtrum. Tenderness to palpation. Patient with multiple dental caries.  No evidence of dental abscess or oropharyngeal abscess or lesions.  Patient does have dentures.  Mucous membranes are moist.  No evidence of tongue or palate swelling or elevation.  No erythema or edema to submandibular area.  Phonation normal.  No tenderness to palpation of sinuses.    Eyes: Pupils are equal, round, and reactive to light.   No horizontal, vertical or rotational nystagmus   Neck: Normal range of motion, full passive range of motion without pain and phonation normal. No neck rigidity. No edema, no erythema and normal range of motion present.  Full active and passive ROM without pain No midline or paraspinal tenderness No nuchal rigidity or meningeal signs    Cardiovascular: Normal rate, regular rhythm, normal heart sounds, intact distal pulses and normal pulses. Exam reveals no gallop and no friction rub.  No  murmur heard. Pulmonary/Chest: Effort normal and breath sounds normal. No stridor. No respiratory distress. She has no wheezes. She has no rales. She exhibits no tenderness.  Abdominal: Soft. Bowel sounds are normal. She exhibits no distension. There is no tenderness. There is no guarding.  Well healing midline surgical scar. No active drainage, erythema, edema or tenderness.  Musculoskeletal: Normal range of motion.  Neurological: She is alert.  Mental Status:  Alert, oriented, thought content appropriate. Speech fluent without evidence of aphasia. Able to follow 2 step commands without difficulty.  Cranial Nerves:  II:  Peripheral visual fields grossly normal, pupils equal, round, reactive to light III,IV, VI: ptosis not present, extra-ocular motions intact bilaterally  V,VII: smile symmetric, facial light touch sensation equal VIII: hearing grossly normal bilaterally  IX,X: midline uvula rise  XI: bilateral shoulder shrug equal and strong XII: midline tongue extension  Motor:  5/5 in upper and lower extremities bilaterally including strong and equal grip strength and dorsiflexion/plantar flexion Sensory: Pinprick and light touch normal in all extremities.  Deep Tendon Reflexes: 2+ and symmetric  Cerebellar: normal finger-to-nose with bilateral upper extremities Gait: normal gait and balance CV: distal pulses palpable throughout    Skin: Skin is warm and dry.  Psychiatric: She has a normal mood and affect.  Nursing note and vitals reviewed.      ED Treatments / Results  Labs (all labs ordered are listed, but only abnormal results are displayed) Labs Reviewed - No data to display  EKG None  Radiology No results found.  Procedures Procedures (including critical care time)  Medications Ordered in ED Medications  oxyCODONE-acetaminophen (PERCOCET/ROXICET) 5-325 MG per tablet 1 tablet (1 tablet Oral Given 11/18/17 0742)     Initial Impression / Assessment and Plan / ED  Course  I have reviewed the triage vital signs and the nursing notes.  Pertinent labs & imaging results that were available during my care of the patient were reviewed by me and considered in my medical decision making (see chart for details).  42 year old female who presents for evaluation of lip sore x 2 days. Afebrile, non-septic, non-ill-appearing.  Patient has a 4 mm rounded sore above the left portion of her lip.  Lesion does not extend into philtrum or nasal fold. Mild surrounding erythema, swelling and tenderness.  No active drainage.  Lesion consistent with possible early MRSA abscess.  No evidence of dental or oropharyngeal abscess.  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  Will place  on clindamycin and have patient place warm compress to this area. Low suspicion for Ludwig's angina or spread of infection.  Admits to a "headache" however patient points to her sore as the location of her "head pain." Feel her pain is due to her lip sore. Normal neurologic exam without deficits. Non concerning for [facility], [facility], Meningitis, or temporal arteritis. Stable for DC home at this time. Strict return precautions discussed with patient. Patient voiced understanding is agreeable for follow-up.    Final Clinical Impressions(s) / ED Diagnoses   Final diagnoses:  Abscess    ED Discharge Orders         Ordered    clindamycin (CLEOCIN) 150 MG capsule  3 times daily     11/18/17 0849           Shyloh Derosa A, PA-C 11/18/17 0903    Lorre Nick, MD 11/19/17 [id number]

## 2017-11-18 NOTE — ED Provider Notes (Signed)
Medical screening examination/treatment/procedure(s) were conducted as a shared visit with non-physician practitioner(s) and myself.  I personally evaluated the patient during the encounter.  None 42 year old female presents with upper lip lesion.  On exam consistent with MRSA.  Will place on antibiotics and return precautions given   Lorre Nick, MD 11/18/17 [phone]

## 2017-12-10 ENCOUNTER — Encounter (HOSPITAL_COMMUNITY): Payer: Self-pay | Admitting: Emergency Medicine

## 2017-12-10 ENCOUNTER — Emergency Department (HOSPITAL_COMMUNITY)
Admission: EM | Admit: 2017-12-10 | Discharge: 2017-12-10 | Disposition: A | Discharge status: Home / Self Care | Payer: Medicare HMO | Attending: Emergency Medicine | Admitting: Emergency Medicine

## 2017-12-10 ENCOUNTER — Emergency Department (HOSPITAL_COMMUNITY): Payer: Medicare HMO

## 2017-12-10 DIAGNOSIS — Z87891 Personal history of nicotine dependence: Secondary | ICD-10-CM | POA: Insufficient documentation

## 2017-12-10 DIAGNOSIS — Z7984 Long term (current) use of oral hypoglycemic drugs: Secondary | ICD-10-CM | POA: Insufficient documentation

## 2017-12-10 DIAGNOSIS — I10 Essential (primary) hypertension: Secondary | ICD-10-CM | POA: Insufficient documentation

## 2017-12-10 DIAGNOSIS — E119 Type 2 diabetes mellitus without complications: Secondary | ICD-10-CM | POA: Diagnosis not present

## 2017-12-10 DIAGNOSIS — J069 Acute upper respiratory infection, unspecified: Secondary | ICD-10-CM | POA: Diagnosis not present

## 2017-12-10 DIAGNOSIS — Z590 Homelessness: Secondary | ICD-10-CM | POA: Insufficient documentation

## 2017-12-10 DIAGNOSIS — R05 Cough: Secondary | ICD-10-CM | POA: Diagnosis present

## 2017-12-10 DIAGNOSIS — M7918 Myalgia, other site: Secondary | ICD-10-CM | POA: Diagnosis not present

## 2017-12-10 LAB — INFLUENZA PANEL BY PCR (TYPE A & B)
INFLBPCR: NEGATIVE
Influenza A By PCR: NEGATIVE

## 2017-12-10 MED ORDER — FLUTICASONE FUROATE 50 MCG/ACT IN AEPB: 1.0000 | INHALATION_SPRAY | Freq: Two times a day (BID) | RESPIRATORY_TRACT | 0 refills | Status: AC

## 2017-12-10 MED ORDER — GUAIFENESIN-CODEINE 100-10 MG/5ML PO SOLN: 10.0000 mL | Freq: Four times a day (QID) | ORAL | 0 refills | Status: AC | PRN

## 2017-12-10 NOTE — ED Provider Notes (Signed)
[facility] EMERGENCY DEPARTMENT Provider Note   CSN: [id number] Arrival date & time: 12/10/17  0865     History   Chief Complaint cough   HPI Cheryl Stokes is a 42 y.o. female with past medical history significant from DM, HTN, splenectomy who presents for evaluation of cough and body aches and pains. States this begin approximately two days ago. Patients states she hasn't slept well due to her nasal congestion and cough. Cough productive of yellow sputum. Admits to subjective fever, rhinorrhea, nasal congestion. Has not taken anything for her symptoms. Patient is homeless and stays in various shelters. States "everyone has been sick around me." Denies chills, HA, chest pain, SOB, nausea, emesis, abdominal pain, dysuria, diarrhea, leg swelling, PND, orthopnea.  Denies additional alleviating or aggravating symptoms unless otherwise listed above.  History obtained from patient.  No interpreter was used.  HPI  Past Medical History:  Diagnosis Date  . Diabetes mellitus without complication (HCC)   . Hypertension   . Sleep apnea     There are no active problems to display for this patient.   Past Surgical History:  Procedure Laterality Date  . BARIATRIC SURGERY  03/2015   Sleve  . SPLENECTOMY    . TONSILLECTOMY       OB History   None      Home Medications    Prior to Admission medications   Medication Sig Start Date End Date Taking? Authorizing Provider  acetaminophen (TYLENOL) 325 MG tablet Take 2 tablets (650 mg total) by mouth every 6 (six) hours as needed for moderate pain. Patient not taking: Reported on 11/18/2017 02/11/16   Mesner, Barbara Cower, MD  amLODipine (NORVASC) 10 MG tablet Take 10 mg by mouth daily as needed (blood pressure).     [provider]  atorvastatin (LIPITOR) 20 MG tablet Take 20 mg by mouth daily.  03/20/16   [provider]  benzonatate (TESSALON) 100 MG capsule Take 1 capsule (100 mg total) by mouth every 8  (eight) hours. Patient not taking: Reported on 11/18/2017 02/11/16   Mesner, Barbara Cower, MD  Calcium Carb-Ergocalciferol 250-125 MG-UNIT TABS Take 1 tablet by mouth daily.  04/13/16   [provider]  calcium-vitamin D (OSCAL WITH D) 250-125 MG-UNIT tablet Take 1 tablet by mouth daily.    [provider]  Canagliflozin-Metformin HCl (INVOKAMET PO) Take by mouth.    [provider]  clonazePAM (KLONOPIN) 1 MG tablet Take 1 mg by mouth 2 (two) times daily.    [provider]  cyclobenzaprine (FLEXERIL) 10 MG tablet Take 1 tablet (10 mg total) by mouth 3 (three) times daily as needed for muscle spasms. Patient not taking: Reported on 11/18/2017 07/29/16   Geoffery Lyons, MD  EPINEPHrine 0.3 mg/0.3 mL IJ SOAJ injection Inject 0.3 mg into the muscle once as needed (Allergies).  12/09/13   [provider]  ferrous sulfate 325 (65 FE) MG tablet Take 325 mg by mouth daily.  03/20/16   [provider]  Fluconazole (DIFLUCAN PO) Take 150 mg by mouth once.     [provider]  Fluticasone Furoate 50 MCG/ACT AEPB Place 1 spray into both nostrils 2 (two) times daily for 7 days. 12/10/17 12/17/17  Henderly, Britni A, PA-C  guaiFENesin-codeine 100-10 MG/5ML syrup Take 10 mLs by mouth every 6 (six) hours as needed for up to 3 days for cough. 12/10/17 12/13/17  Henderly, Britni A, PA-C  ibuprofen (ADVIL,MOTRIN) 800 MG tablet Take 800 mg by mouth every  8 (eight) hours as needed for mild pain.     [provider]  methocarbamol (ROBAXIN) 500 MG tablet Take 1 tablet (500 mg total) by mouth 2 (two) times daily. Patient taking differently: Take 500 mg by mouth 2 (two) times daily as needed for muscle spasms.  08/01/16   Khatri, Hina, PA-C  Multiple Vitamin (MULTIVITAMIN) tablet Take 1 tablet by mouth daily.    [provider]  nystatin (MYCOSTATIN/NYSTOP) powder Apply 1 g topically daily as needed (dry skin).  11/04/17   [provider]    ondansetron (ZOFRAN ODT) 4 MG disintegrating tablet 4mg  ODT q4 hours prn nausea/vomit Patient not taking: Reported on 11/18/2017 02/11/16   Mesner, Barbara Cower, MD  Oxycodone HCl 10 MG TABS Take 10 mg by mouth 4 (four) times daily as needed (pain/pinched nerves).  06/21/14   [provider]  pantoprazole (PROTONIX) 40 MG tablet Take 40 mg by mouth 2 (two) times daily. 11/07/17   [provider]  predniSONE (DELTASONE) 10 MG tablet Take 2 tablets (20 mg total) by mouth 2 (two) times daily. Patient not taking: Reported on 11/18/2017 07/29/16   Geoffery Lyons, MD  pregabalin (LYRICA) 150 MG capsule Take 150 mg by mouth 3 (three) times daily as needed (pinched nerves).     [provider]  promethazine (PHENERGAN) 25 MG tablet Take 25 mg by mouth every 6 (six) hours as needed. 11/07/17   [provider]  vitamin B-12 (CYANOCOBALAMIN) 1000 MCG tablet Take 1,000 mcg by mouth daily.  04/13/16   [provider]  Vitamin D, Ergocalciferol, (DRISDOL) 50000 units CAPS capsule Take 50,000 Units by mouth every 7 (seven) days.     [provider]    Family History No family history on file.  Social History Social History   Tobacco Use  . Smoking status: Former Smoker    Packs/day: 0.00  . Smokeless tobacco: Never Used  Substance Use Topics  . Alcohol use: No  . Drug use: No     Allergies   Bee venom; Tomato; Metformin and related; and Mobic [meloxicam]   Review of Systems Review of Systems  Constitutional: Positive for fever. Negative for chills, fatigue and unexpected weight change.       Subjective fever  HENT: Positive for congestion, postnasal drip and rhinorrhea. Negative for dental problem, drooling, ear discharge, ear pain, facial swelling, hearing loss, mouth sores, nosebleeds, sinus pressure, sinus pain, sneezing, sore throat, tinnitus, trouble swallowing and voice change.   Respiratory: Negative.   Cardiovascular: Negative.    Gastrointestinal: Negative.   Genitourinary: Negative.   Skin: Negative.   Neurological: Negative for dizziness, weakness and light-headedness.  All other systems reviewed and are negative.    Physical Exam Updated Vital Signs BP (!) 142/84 (BP Location: Right Arm)   Pulse 86   Temp 98.1 F (36.7 C) (Oral)   Resp 20   Ht 5\' 9"  (1.753 m)   Wt 72.6 kg   SpO2 100%   BMI 23.63 kg/m   Physical Exam  Constitutional: Vital signs are normal. She appears well-developed and well-nourished.  Non-toxic appearance. She does not have a sickly appearance. She does not appear ill. No distress.  HENT:  Head: Normocephalic and atraumatic.  Right Ear: Tympanic membrane, external ear and ear canal normal. No swelling or tenderness. Tympanic membrane is not injected, not scarred, not perforated, not erythematous, not retracted and not bulging.  Left Ear: Tympanic membrane, external ear and ear canal normal. No swelling or  tenderness. Tympanic membrane is not injected, not scarred, not perforated, not erythematous, not retracted and not bulging.  Nose: Mucosal edema and rhinorrhea present. No nose lacerations, sinus tenderness, nasal deformity, septal deviation or nasal septal hematoma. No epistaxis.  No foreign bodies. Right sinus exhibits frontal sinus tenderness. Right sinus exhibits no maxillary sinus tenderness. Left sinus exhibits frontal sinus tenderness. Left sinus exhibits no maxillary sinus tenderness.  Mouth/Throat: Uvula is midline and mucous membranes are normal. No trismus in the jaw. No uvula swelling. Posterior oropharyngeal erythema present. No oropharyngeal exudate, posterior oropharyngeal edema or tonsillar abscesses. No tonsillar exudate.  Eyes: Pupils are equal, round, and reactive to light.  Neck: Normal range of motion, full passive range of motion without pain and phonation normal. Neck supple. No neck rigidity. No edema, no erythema and normal range of motion present.   Cardiovascular: Normal rate, regular rhythm, normal heart sounds, intact distal pulses and normal pulses.  Pulmonary/Chest: Breath sounds normal. No accessory muscle usage or stridor. No tachypnea. No respiratory distress. She has no decreased breath sounds. She has no wheezes. She has no rhonchi. She has no rales.  Abdominal: Soft. Normal appearance. She exhibits no distension, no pulsatile liver, no fluid wave, no ascites, no pulsatile midline mass and no mass. There is no hepatosplenomegaly. There is no tenderness. There is no rigidity, no rebound, no guarding and no CVA tenderness.  Musculoskeletal: Normal range of motion.  Neurological: She is alert. No sensory deficit.  Skin: Skin is warm and dry. No abrasion, no bruising, no ecchymosis, no petechiae and no rash noted. She is not diaphoretic. No erythema.  Psychiatric: She has a normal mood and affect.  Nursing note and vitals reviewed.    ED Treatments / Results  Labs (all labs ordered are listed, but only abnormal results are displayed) Labs Reviewed  INFLUENZA PANEL BY PCR (TYPE A & B)    EKG None  Radiology Dg Chest 2 View  Result Date: 12/10/2017 CLINICAL DATA:  Cough for 2 days. EXAM: CHEST - 2 VIEW COMPARISON:  06/18/2017 FINDINGS: The cardiomediastinal contours are normal. The lungs are clear. Pulmonary vasculature is normal. No consolidation, pleural effusion, or pneumothorax. No acute osseous abnormalities are seen. IMPRESSION: Negative radiographs of the chest. Electronically Signed   By: Narda Rutherford M.D.   On: 12/10/2017 06:34    Procedures Procedures (including critical care time)  Medications Ordered in ED Medications - No data to display   Initial Impression / Assessment and Plan / ED Course  I have reviewed the triage vital signs and the nursing notes.  Pertinent labs & imaging results that were available during my care of the patient were reviewed by me and considered in my medical decision making  (see chart for details).  42 year old female who appears otherwise well presents for evaluation of cough and body aches and pains.  Symptom onset 2 days ago.  Has had multiple sick exposures as she lives in a homeless shelter.  Patient is immunocompromise as she is diabetic and is also had a splenectomy. Patient states she thinks she had a pneumonia vaccine as well as influenza, however is not "100% sure."  Patient admits to subjective fever, body aches and pains, productive cough of yellow sputum as well as rhinorrhea and nasal congestion.  She is afebrile, nonseptic, non-ill-appearing in the department.  Able to speak in full sentences without difficulty.  Oxygen saturation 100% on room air with good waveform.  She is not tachycardic or tachypneic.  Lungs  clear to auscultation bilaterally.  Mild posterior oropharynx erythema without edema or exudate.  No tonsillar exudate or edema.  Nose with rhinorrhea.  Will obtain influenza screen as well as chest x-ray to r/o pneumonia and reevaluate.  0715: On reevaluation patient walking around room talking on phone in no acute distress, plain film negative for infiltrates, CHF, pulmonary edema, pneumothorax. Patient is hemodynamically stable in no acute distress. Symptoms likely viral URI.  Low suspicion for emergent pathology causing patient's symptoms at this time.  Dicussed symptomatic treatment. Discussed return precautions. Patient voices understanding and is agreeable for follow-up.     Final Clinical Impressions(s) / ED Diagnoses   Final diagnoses:  Viral upper respiratory tract infection    ED Discharge Orders         Ordered    guaiFENesin-codeine 100-10 MG/5ML syrup  Every 6 hours PRN     12/10/17 0720    Fluticasone Furoate 50 MCG/ACT AEPB  2 times daily     12/10/17 0720           Henderly, Britni A, PA-C 12/10/17 0731    Zadie RhineWickline, Donald, MD 12/10/17 [phone]

## 2017-12-10 NOTE — ED Triage Notes (Signed)
BIB EMS, pt reports cough, nasal congestion, body aches X few days. Pt states she has tried OTC meds for relief.

## 2017-12-10 NOTE — ED Notes (Signed)
Patient verbalizes understanding of discharge instructions. Opportunity for questioning and answers were provided. Pt discharged from ED. 

## 2017-12-10 NOTE — Discharge Instructions (Addendum)
You were evaluated today for cough and congestion. Your chest xray is negative. I have given you symptomatic treatments. Please take as prescribed. Follow -up with your PCP for re-evaluation.  Symptoms are most consistent with upper respiratory infection.  Your flu screen was negative.  Return to the ED with any new or worsening symptoms

## 2018-01-17 ENCOUNTER — Emergency Department (HOSPITAL_COMMUNITY)
Admission: EM | Admit: 2018-01-17 | Discharge: 2018-01-17 | Disposition: A | Discharge status: Home / Self Care | Payer: Medicare HMO | Attending: Emergency Medicine | Admitting: Emergency Medicine

## 2018-01-17 ENCOUNTER — Encounter (HOSPITAL_COMMUNITY): Payer: Self-pay | Admitting: Emergency Medicine

## 2018-01-17 DIAGNOSIS — Z3202 Encounter for pregnancy test, result negative: Secondary | ICD-10-CM

## 2018-01-17 DIAGNOSIS — R109 Unspecified abdominal pain: Secondary | ICD-10-CM | POA: Insufficient documentation

## 2018-01-17 DIAGNOSIS — Z79899 Other long term (current) drug therapy: Secondary | ICD-10-CM | POA: Diagnosis not present

## 2018-01-17 DIAGNOSIS — F17210 Nicotine dependence, cigarettes, uncomplicated: Secondary | ICD-10-CM | POA: Diagnosis not present

## 2018-01-17 DIAGNOSIS — I10 Essential (primary) hypertension: Secondary | ICD-10-CM | POA: Insufficient documentation

## 2018-01-17 DIAGNOSIS — E119 Type 2 diabetes mellitus without complications: Secondary | ICD-10-CM | POA: Diagnosis not present

## 2018-01-17 LAB — POC URINE PREG, ED: PREG TEST UR: NEGATIVE

## 2018-01-17 NOTE — ED Provider Notes (Addendum)
[facility] EMERGENCY DEPARTMENT Provider Note   CSN: [id number] Arrival date & time: 01/17/18  1522     History   Chief Complaint Chief Complaint  Patient presents with  . Possible Pregnancy    HPI Cheryl Stokes is a 43 y.o. female status post gastric sleeve and abdominal plasty, hypertension, diabetes, presenting to the emergency department with primary concern for possible pregnancy.  Patient states her LMP was beginning of November.  States however she is perimenopausal.  She is currently sexually active with a female partner without protection.  She endorses some intermittent episodes of nausea with nonbloody nonbilious emesis, only a few times in the past 2 weeks.  She states she feels a tightness sensation to her abdominal wall since her abdominoplasty in October 2019.  States it is worse when she reaches her arms overhead.  She denies vaginal bleeding, discharge, fever, urinary symptoms.  No history of STDs or PID.  No medications tried for symptoms.  States she did not take her blood pressure medications today.  She has follow-up with her PCP on the Jan 14 and intends to follow-up with her plastic surgeon.  The history is provided by the patient.    Past Medical History:  Diagnosis Date  . Diabetes mellitus without complication (HCC)   . Hypertension   . Sleep apnea     There are no active problems to display for this patient.   Past Surgical History:  Procedure Laterality Date  . BARIATRIC SURGERY  03/2015   Sleve  . SPLENECTOMY    . TONSILLECTOMY       OB History   No obstetric history on file.      Home Medications    Prior to Admission medications   Medication Sig Start Date End Date Taking? Authorizing Provider  acetaminophen (TYLENOL) 325 MG tablet Take 2 tablets (650 mg total) by mouth every 6 (six) hours as needed for moderate pain. Patient not taking: Reported on 11/18/2017 02/11/16   Mesner, Barbara Cower, MD  amLODipine (NORVASC) 10 MG  tablet Take 10 mg by mouth daily as needed (blood pressure).     [provider]  atorvastatin (LIPITOR) 20 MG tablet Take 20 mg by mouth daily.  03/20/16   [provider]  benzonatate (TESSALON) 100 MG capsule Take 1 capsule (100 mg total) by mouth every 8 (eight) hours. Patient not taking: Reported on 11/18/2017 02/11/16   Mesner, Barbara Cower, MD  Calcium Carb-Ergocalciferol 250-125 MG-UNIT TABS Take 1 tablet by mouth daily.  04/13/16   [provider]  calcium-vitamin D (OSCAL WITH D) 250-125 MG-UNIT tablet Take 1 tablet by mouth daily.    [provider]  Canagliflozin-Metformin HCl (INVOKAMET PO) Take by mouth.    [provider]  clonazePAM (KLONOPIN) 1 MG tablet Take 1 mg by mouth 2 (two) times daily.    [provider]  cyclobenzaprine (FLEXERIL) 10 MG tablet Take 1 tablet (10 mg total) by mouth 3 (three) times daily as needed for muscle spasms. Patient not taking: Reported on 11/18/2017 07/29/16   Geoffery Lyons, MD  EPINEPHrine 0.3 mg/0.3 mL IJ SOAJ injection Inject 0.3 mg into the muscle once as needed (Allergies).  12/09/13   [provider]  ferrous sulfate 325 (65 FE) MG tablet Take 325 mg by mouth daily.  03/20/16   [provider]  Fluconazole (DIFLUCAN PO) Take 150 mg by mouth once.     [provider]  ibuprofen (ADVIL,MOTRIN) 800 MG tablet Take 800  mg by mouth every 8 (eight) hours as needed for mild pain.     [provider]  methocarbamol (ROBAXIN) 500 MG tablet Take 1 tablet (500 mg total) by mouth 2 (two) times daily. Patient taking differently: Take 500 mg by mouth 2 (two) times daily as needed for muscle spasms.  08/01/16   Khatri, Hina, PA-C  Multiple Vitamin (MULTIVITAMIN) tablet Take 1 tablet by mouth daily.    [provider]  nystatin (MYCOSTATIN/NYSTOP) powder Apply 1 g topically daily as needed (dry skin).  11/04/17   [provider]  ondansetron (ZOFRAN ODT) 4 MG  disintegrating tablet 4mg  ODT q4 hours prn nausea/vomit Patient not taking: Reported on 11/18/2017 02/11/16   Mesner, Barbara CowerJason, MD  Oxycodone HCl 10 MG TABS Take 10 mg by mouth 4 (four) times daily as needed (pain/pinched nerves).  06/21/14   [provider]  pantoprazole (PROTONIX) 40 MG tablet Take 40 mg by mouth 2 (two) times daily. 11/07/17   [provider]  predniSONE (DELTASONE) 10 MG tablet Take 2 tablets (20 mg total) by mouth 2 (two) times daily. Patient not taking: Reported on 11/18/2017 07/29/16   Geoffery Lyonselo, Douglas, MD  pregabalin (LYRICA) 150 MG capsule Take 150 mg by mouth 3 (three) times daily as needed (pinched nerves).     [provider]  promethazine (PHENERGAN) 25 MG tablet Take 25 mg by mouth every 6 (six) hours as needed. 11/07/17   [provider]  vitamin B-12 (CYANOCOBALAMIN) 1000 MCG tablet Take 1,000 mcg by mouth daily.  04/13/16   [provider]  Vitamin D, Ergocalciferol, (DRISDOL) 50000 units CAPS capsule Take 50,000 Units by mouth every 7 (seven) days.     [provider]    Family History No family history on file.  Social History Social History   Tobacco Use  . Smoking status: Current Every Day Smoker    Packs/day: 0.00    Types: Cigarettes  . Smokeless tobacco: Never Used  Substance Use Topics  . Alcohol use: No  . Drug use: No     Allergies   Bee venom; Tomato; Metformin and related; and Mobic [meloxicam]   Review of Systems Review of Systems  All other systems reviewed and are negative.    Physical Exam Updated Vital Signs BP (!) 153/81 (BP Location: Right Arm)   Pulse 82   Temp 98.6 F (37 C) (Oral)   Resp 18   Ht 5\' 9"  (1.753 m)   Wt 72.6 kg   SpO2 100%   BMI 23.63 kg/m   Physical Exam Vitals signs and nursing note reviewed.  Constitutional:      General: She is not in acute distress.    Appearance: She is well-developed. She is not ill-appearing.  HENT:     Head: Normocephalic and  atraumatic.  Eyes:     Conjunctiva/sclera: Conjunctivae normal.  Cardiovascular:     Rate and Rhythm: Normal rate and regular rhythm.  Pulmonary:     Effort: Pulmonary effort is normal.     Breath sounds: Normal breath sounds.  Abdominal:     General: Abdomen is flat. Bowel sounds are normal. There is no distension.     Palpations: Abdomen is soft. There is no mass.     Comments: Patient did not remove her clothing for examination of her abdomen.  She had generalized discomfort with palpation mostly to the lower abdomen where she reports her surgical scars are, which she reports is to her abdominal wall, does  not feel deep in her abdomen.  No guarding or rebound.  Skin:    General: Skin is warm.  Neurological:     Mental Status: She is alert.  Psychiatric:        Behavior: Behavior normal.      ED Treatments / Results  Labs (all labs ordered are listed, but only abnormal results are displayed) Labs Reviewed  POC URINE PREG, ED    EKG None  Radiology No results found.  Procedures Procedures (including critical care time)  Medications Ordered in ED Medications - No data to display   Initial Impression / Assessment and Plan / ED Course  I have reviewed the triage vital signs and the nursing notes.  Pertinent labs & imaging results that were available during my care of the patient were reviewed by me and considered in my medical decision making (see chart for details).     Presenting to the ED with concern for possible pregnancy, with LMP in the beginning of November.  Patient states she is currently perimenopausal. Urine preg is negative.  She is status post gastric sleeve surgery, as well as abdominoplasty which was recently done in October 2019.  She has no pelvic complaints.  Complains of some abdominal wall tightness worse with movement.  She states her discomfort is mostly where her surgical scars are located on abdominal exam, however did not show me her abdomen  for full exam.  She states her abdominal complaints are very mild and she can follow-up with her primary care provider.  She was mostly concerned about a possible pregnancy with the late menstrual period.  Discussed strict return precautions to the ED.  Patient to follow-up with PCP.  Safe for discharge.  Discussed with Dr. Charm BargesButler.  Discussed results, findings, treatment and follow up. Patient advised of return precautions. Patient verbalized understanding and agreed with plan.   Final Clinical Impressions(s) / ED Diagnoses   Final diagnoses:  Abdominal discomfort  Pregnancy examination or test, negative result    ED Discharge Orders    None       Arzell Mcgeehan, [place] N, PA-C 01/17/18 1904    Corin Tilly, [place] N, [place]-C 01/17/18 1904    Terrilee FilesButler, Michael C, MD 01/18/18 1312

## 2018-01-17 NOTE — Discharge Instructions (Addendum)
Follow up with your primary care provider. Return for severely worsening abdominal pain, uncontrollable vomiting, fever.

## 2018-01-17 NOTE — ED Triage Notes (Signed)
Pt reports for the last two weeks N/V with abd cramping. Pt states she may be pregnant. Has missed her period.

## 2018-03-23 NOTE — Congregational Nurse Program (Signed)
Stated she was a diabetic and just needed to keep track of her blood sugar.  Fasting blood glucose  125 B P  142/84 p 81 Reviewed importance of maintaining a heart healty diet and getting exercise Jenene Slicker RN, [place] [phone]
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
## 2018-12-26 ENCOUNTER — Other Ambulatory Visit: Payer: Self-pay

## 2018-12-26 DIAGNOSIS — Z20822 Contact with and (suspected) exposure to covid-19: Secondary | ICD-10-CM

## 2018-12-27 LAB — NOVEL CORONAVIRUS, NAA: SARS-CoV-2, NAA: NOT DETECTED

## 2019-01-01 ENCOUNTER — Telehealth: Payer: Self-pay | Admitting: *Deleted

## 2019-01-01 NOTE — Telephone Encounter (Signed)
Pt given result of COVID test from 12/26/2018; she verbalized understanding.

## 2019-01-30 DIAGNOSIS — E7800 Pure hypercholesterolemia, unspecified: Secondary | ICD-10-CM | POA: Diagnosis not present

## 2019-01-30 DIAGNOSIS — M5441 Lumbago with sciatica, right side: Secondary | ICD-10-CM | POA: Diagnosis not present

## 2019-01-30 DIAGNOSIS — R7303 Prediabetes: Secondary | ICD-10-CM | POA: Diagnosis not present

## 2019-01-30 DIAGNOSIS — F1120 Opioid dependence, uncomplicated: Secondary | ICD-10-CM | POA: Diagnosis not present

## 2019-01-30 DIAGNOSIS — Z79899 Other long term (current) drug therapy: Secondary | ICD-10-CM | POA: Diagnosis not present

## 2019-01-30 DIAGNOSIS — G8929 Other chronic pain: Secondary | ICD-10-CM | POA: Diagnosis not present

## 2019-01-30 DIAGNOSIS — Z20828 Contact with and (suspected) exposure to other viral communicable diseases: Secondary | ICD-10-CM | POA: Diagnosis not present

## 2019-01-30 DIAGNOSIS — E559 Vitamin D deficiency, unspecified: Secondary | ICD-10-CM | POA: Diagnosis not present

## 2019-01-30 DIAGNOSIS — Z79891 Long term (current) use of opiate analgesic: Secondary | ICD-10-CM | POA: Diagnosis not present

## 2019-02-10 DIAGNOSIS — Z01818 Encounter for other preprocedural examination: Secondary | ICD-10-CM | POA: Diagnosis not present

## 2019-02-12 DIAGNOSIS — K5903 Drug induced constipation: Secondary | ICD-10-CM | POA: Diagnosis not present

## 2019-02-12 DIAGNOSIS — Z1211 Encounter for screening for malignant neoplasm of colon: Secondary | ICD-10-CM | POA: Diagnosis not present

## 2019-02-12 DIAGNOSIS — Z01818 Encounter for other preprocedural examination: Secondary | ICD-10-CM | POA: Diagnosis not present

## 2019-02-24 DIAGNOSIS — E782 Mixed hyperlipidemia: Secondary | ICD-10-CM | POA: Diagnosis not present

## 2019-02-24 DIAGNOSIS — Z789 Other specified health status: Secondary | ICD-10-CM | POA: Diagnosis not present

## 2019-02-24 DIAGNOSIS — I10 Essential (primary) hypertension: Secondary | ICD-10-CM | POA: Diagnosis not present

## 2019-02-25 DIAGNOSIS — M542 Cervicalgia: Secondary | ICD-10-CM | POA: Diagnosis not present

## 2019-02-25 DIAGNOSIS — Z79899 Other long term (current) drug therapy: Secondary | ICD-10-CM | POA: Diagnosis not present

## 2019-02-25 DIAGNOSIS — M25561 Pain in right knee: Secondary | ICD-10-CM | POA: Diagnosis not present

## 2019-02-25 DIAGNOSIS — E119 Type 2 diabetes mellitus without complications: Secondary | ICD-10-CM | POA: Diagnosis not present

## 2019-02-25 DIAGNOSIS — Z20822 Contact with and (suspected) exposure to covid-19: Secondary | ICD-10-CM | POA: Diagnosis not present

## 2019-02-25 DIAGNOSIS — G8929 Other chronic pain: Secondary | ICD-10-CM | POA: Diagnosis not present

## 2019-02-25 DIAGNOSIS — F1120 Opioid dependence, uncomplicated: Secondary | ICD-10-CM | POA: Diagnosis not present

## 2019-02-25 DIAGNOSIS — M5441 Lumbago with sciatica, right side: Secondary | ICD-10-CM | POA: Diagnosis not present

## 2019-03-12 DIAGNOSIS — M5441 Lumbago with sciatica, right side: Secondary | ICD-10-CM | POA: Diagnosis not present

## 2019-03-12 DIAGNOSIS — E119 Type 2 diabetes mellitus without complications: Secondary | ICD-10-CM | POA: Diagnosis not present

## 2019-03-12 DIAGNOSIS — Z79891 Long term (current) use of opiate analgesic: Secondary | ICD-10-CM | POA: Diagnosis not present

## 2019-03-12 DIAGNOSIS — F1120 Opioid dependence, uncomplicated: Secondary | ICD-10-CM | POA: Diagnosis not present

## 2019-03-12 DIAGNOSIS — G8929 Other chronic pain: Secondary | ICD-10-CM | POA: Diagnosis not present

## 2019-03-12 DIAGNOSIS — F1190 Opioid use, unspecified, uncomplicated: Secondary | ICD-10-CM | POA: Diagnosis not present

## 2019-03-18 DIAGNOSIS — Z91018 Allergy to other foods: Secondary | ICD-10-CM | POA: Diagnosis not present

## 2019-03-18 DIAGNOSIS — M5136 Other intervertebral disc degeneration, lumbar region: Secondary | ICD-10-CM | POA: Diagnosis not present

## 2019-03-18 DIAGNOSIS — R519 Headache, unspecified: Secondary | ICD-10-CM | POA: Diagnosis not present

## 2019-03-18 DIAGNOSIS — F17200 Nicotine dependence, unspecified, uncomplicated: Secondary | ICD-10-CM | POA: Diagnosis not present

## 2019-03-18 DIAGNOSIS — S8991XA Unspecified injury of right lower leg, initial encounter: Secondary | ICD-10-CM | POA: Diagnosis not present

## 2019-03-18 DIAGNOSIS — I10 Essential (primary) hypertension: Secondary | ICD-10-CM | POA: Diagnosis not present

## 2019-03-18 DIAGNOSIS — S3992XA Unspecified injury of lower back, initial encounter: Secondary | ICD-10-CM | POA: Diagnosis not present

## 2019-03-18 DIAGNOSIS — S199XXA Unspecified injury of neck, initial encounter: Secondary | ICD-10-CM | POA: Diagnosis not present

## 2019-03-18 DIAGNOSIS — R52 Pain, unspecified: Secondary | ICD-10-CM | POA: Diagnosis not present

## 2019-03-18 DIAGNOSIS — Z91030 Bee allergy status: Secondary | ICD-10-CM | POA: Diagnosis not present

## 2019-03-18 DIAGNOSIS — Z888 Allergy status to other drugs, medicaments and biological substances status: Secondary | ICD-10-CM | POA: Diagnosis not present

## 2019-03-18 DIAGNOSIS — M545 Low back pain: Secondary | ICD-10-CM | POA: Diagnosis not present

## 2019-03-18 DIAGNOSIS — M25561 Pain in right knee: Secondary | ICD-10-CM | POA: Diagnosis not present

## 2019-03-25 DIAGNOSIS — E119 Type 2 diabetes mellitus without complications: Secondary | ICD-10-CM | POA: Diagnosis not present

## 2019-03-25 DIAGNOSIS — M5441 Lumbago with sciatica, right side: Secondary | ICD-10-CM | POA: Diagnosis not present

## 2019-03-25 DIAGNOSIS — I10 Essential (primary) hypertension: Secondary | ICD-10-CM | POA: Diagnosis not present

## 2019-03-25 DIAGNOSIS — Z79899 Other long term (current) drug therapy: Secondary | ICD-10-CM | POA: Diagnosis not present

## 2019-03-25 DIAGNOSIS — E7800 Pure hypercholesterolemia, unspecified: Secondary | ICD-10-CM | POA: Diagnosis not present

## 2019-03-25 DIAGNOSIS — G8929 Other chronic pain: Secondary | ICD-10-CM | POA: Diagnosis not present

## 2019-04-08 DIAGNOSIS — Z79899 Other long term (current) drug therapy: Secondary | ICD-10-CM | POA: Diagnosis not present

## 2019-04-08 DIAGNOSIS — R32 Unspecified urinary incontinence: Secondary | ICD-10-CM | POA: Diagnosis not present

## 2019-04-08 DIAGNOSIS — G8929 Other chronic pain: Secondary | ICD-10-CM | POA: Diagnosis not present

## 2019-04-08 DIAGNOSIS — M5441 Lumbago with sciatica, right side: Secondary | ICD-10-CM | POA: Diagnosis not present

## 2019-04-08 DIAGNOSIS — E119 Type 2 diabetes mellitus without complications: Secondary | ICD-10-CM | POA: Diagnosis not present

## 2019-04-08 DIAGNOSIS — Z20822 Contact with and (suspected) exposure to covid-19: Secondary | ICD-10-CM | POA: Diagnosis not present

## 2019-04-09 DIAGNOSIS — R32 Unspecified urinary incontinence: Secondary | ICD-10-CM | POA: Diagnosis not present

## 2019-04-14 DIAGNOSIS — I10 Essential (primary) hypertension: Secondary | ICD-10-CM | POA: Diagnosis not present

## 2019-04-14 DIAGNOSIS — Z79899 Other long term (current) drug therapy: Secondary | ICD-10-CM | POA: Diagnosis not present

## 2019-04-14 DIAGNOSIS — Z789 Other specified health status: Secondary | ICD-10-CM | POA: Diagnosis not present

## 2019-04-16 DIAGNOSIS — Z01818 Encounter for other preprocedural examination: Secondary | ICD-10-CM | POA: Diagnosis not present

## 2019-04-16 DIAGNOSIS — Z1211 Encounter for screening for malignant neoplasm of colon: Secondary | ICD-10-CM | POA: Diagnosis not present

## 2019-04-27 DIAGNOSIS — F1120 Opioid dependence, uncomplicated: Secondary | ICD-10-CM | POA: Diagnosis not present

## 2019-04-27 DIAGNOSIS — G8929 Other chronic pain: Secondary | ICD-10-CM | POA: Diagnosis not present

## 2019-04-27 DIAGNOSIS — Z79899 Other long term (current) drug therapy: Secondary | ICD-10-CM | POA: Diagnosis not present

## 2019-04-27 DIAGNOSIS — M5441 Lumbago with sciatica, right side: Secondary | ICD-10-CM | POA: Diagnosis not present

## 2019-05-08 DIAGNOSIS — I10 Essential (primary) hypertension: Secondary | ICD-10-CM | POA: Diagnosis not present

## 2019-05-08 DIAGNOSIS — G894 Chronic pain syndrome: Secondary | ICD-10-CM | POA: Diagnosis not present

## 2019-05-08 DIAGNOSIS — Z789 Other specified health status: Secondary | ICD-10-CM | POA: Diagnosis not present

## 2019-05-11 DIAGNOSIS — K625 Hemorrhage of anus and rectum: Secondary | ICD-10-CM | POA: Diagnosis not present

## 2019-05-11 DIAGNOSIS — Z1211 Encounter for screening for malignant neoplasm of colon: Secondary | ICD-10-CM | POA: Diagnosis not present

## 2019-05-11 DIAGNOSIS — Z01818 Encounter for other preprocedural examination: Secondary | ICD-10-CM | POA: Diagnosis not present

## 2019-05-26 DIAGNOSIS — Z79899 Other long term (current) drug therapy: Secondary | ICD-10-CM | POA: Diagnosis not present

## 2019-05-26 DIAGNOSIS — M5441 Lumbago with sciatica, right side: Secondary | ICD-10-CM | POA: Diagnosis not present

## 2019-05-26 DIAGNOSIS — G8929 Other chronic pain: Secondary | ICD-10-CM | POA: Diagnosis not present

## 2019-06-03 DIAGNOSIS — I10 Essential (primary) hypertension: Secondary | ICD-10-CM | POA: Diagnosis not present

## 2019-06-03 DIAGNOSIS — E782 Mixed hyperlipidemia: Secondary | ICD-10-CM | POA: Diagnosis not present

## 2019-06-03 DIAGNOSIS — Z789 Other specified health status: Secondary | ICD-10-CM | POA: Diagnosis not present

## 2019-06-12 DIAGNOSIS — F329 Major depressive disorder, single episode, unspecified: Secondary | ICD-10-CM | POA: Diagnosis not present

## 2019-06-12 DIAGNOSIS — R45850 Homicidal ideations: Secondary | ICD-10-CM | POA: Diagnosis not present

## 2019-06-12 DIAGNOSIS — Z20822 Contact with and (suspected) exposure to covid-19: Secondary | ICD-10-CM | POA: Diagnosis not present

## 2019-06-12 DIAGNOSIS — Z79899 Other long term (current) drug therapy: Secondary | ICD-10-CM | POA: Diagnosis not present

## 2019-06-12 DIAGNOSIS — M549 Dorsalgia, unspecified: Secondary | ICD-10-CM | POA: Diagnosis not present

## 2019-06-14 DIAGNOSIS — F4322 Adjustment disorder with anxiety: Secondary | ICD-10-CM | POA: Diagnosis not present

## 2019-06-14 DIAGNOSIS — F319 Bipolar disorder, unspecified: Secondary | ICD-10-CM | POA: Diagnosis not present

## 2019-06-14 DIAGNOSIS — G8929 Other chronic pain: Secondary | ICD-10-CM | POA: Diagnosis not present

## 2019-06-14 DIAGNOSIS — I10 Essential (primary) hypertension: Secondary | ICD-10-CM | POA: Diagnosis not present

## 2019-06-14 DIAGNOSIS — E119 Type 2 diabetes mellitus without complications: Secondary | ICD-10-CM | POA: Diagnosis not present

## 2019-06-14 DIAGNOSIS — Z20822 Contact with and (suspected) exposure to covid-19: Secondary | ICD-10-CM | POA: Diagnosis not present

## 2019-06-14 DIAGNOSIS — F329 Major depressive disorder, single episode, unspecified: Secondary | ICD-10-CM | POA: Diagnosis not present

## 2019-06-14 DIAGNOSIS — Z630 Problems in relationship with spouse or partner: Secondary | ICD-10-CM | POA: Diagnosis not present

## 2019-06-14 DIAGNOSIS — D649 Anemia, unspecified: Secondary | ICD-10-CM | POA: Diagnosis not present

## 2019-06-14 DIAGNOSIS — Z79899 Other long term (current) drug therapy: Secondary | ICD-10-CM | POA: Diagnosis not present

## 2019-06-14 DIAGNOSIS — R45850 Homicidal ideations: Secondary | ICD-10-CM | POA: Diagnosis not present

## 2019-06-14 DIAGNOSIS — F419 Anxiety disorder, unspecified: Secondary | ICD-10-CM | POA: Diagnosis not present

## 2019-06-14 DIAGNOSIS — E7800 Pure hypercholesterolemia, unspecified: Secondary | ICD-10-CM | POA: Diagnosis not present

## 2019-06-14 DIAGNOSIS — M549 Dorsalgia, unspecified: Secondary | ICD-10-CM | POA: Diagnosis not present

## 2019-06-14 DIAGNOSIS — Z87891 Personal history of nicotine dependence: Secondary | ICD-10-CM | POA: Diagnosis not present

## 2019-06-14 DIAGNOSIS — F1290 Cannabis use, unspecified, uncomplicated: Secondary | ICD-10-CM | POA: Diagnosis not present

## 2019-06-14 DIAGNOSIS — E1140 Type 2 diabetes mellitus with diabetic neuropathy, unspecified: Secondary | ICD-10-CM | POA: Diagnosis not present

## 2019-06-14 DIAGNOSIS — Z915 Personal history of self-harm: Secondary | ICD-10-CM | POA: Diagnosis not present

## 2019-06-14 DIAGNOSIS — Z9884 Bariatric surgery status: Secondary | ICD-10-CM | POA: Diagnosis not present

## 2019-06-14 DIAGNOSIS — F410 Panic disorder [episodic paroxysmal anxiety] without agoraphobia: Secondary | ICD-10-CM | POA: Diagnosis not present

## 2019-06-26 DIAGNOSIS — Z79899 Other long term (current) drug therapy: Secondary | ICD-10-CM | POA: Diagnosis not present

## 2019-06-26 DIAGNOSIS — G8929 Other chronic pain: Secondary | ICD-10-CM | POA: Diagnosis not present

## 2019-06-26 DIAGNOSIS — E559 Vitamin D deficiency, unspecified: Secondary | ICD-10-CM | POA: Diagnosis not present

## 2019-06-26 DIAGNOSIS — E7800 Pure hypercholesterolemia, unspecified: Secondary | ICD-10-CM | POA: Diagnosis not present

## 2019-06-26 DIAGNOSIS — E119 Type 2 diabetes mellitus without complications: Secondary | ICD-10-CM | POA: Diagnosis not present

## 2019-06-26 DIAGNOSIS — M5441 Lumbago with sciatica, right side: Secondary | ICD-10-CM | POA: Diagnosis not present

## 2019-06-26 DIAGNOSIS — Z1152 Encounter for screening for COVID-19: Secondary | ICD-10-CM | POA: Diagnosis not present

## 2019-06-26 DIAGNOSIS — I10 Essential (primary) hypertension: Secondary | ICD-10-CM | POA: Diagnosis not present

## 2019-06-26 DIAGNOSIS — Z6828 Body mass index (BMI) 28.0-28.9, adult: Secondary | ICD-10-CM | POA: Diagnosis not present

## 2019-06-26 DIAGNOSIS — Z09 Encounter for follow-up examination after completed treatment for conditions other than malignant neoplasm: Secondary | ICD-10-CM | POA: Diagnosis not present

## 2019-07-03 DIAGNOSIS — G8929 Other chronic pain: Secondary | ICD-10-CM | POA: Diagnosis not present

## 2019-07-03 DIAGNOSIS — Z008 Encounter for other general examination: Secondary | ICD-10-CM | POA: Diagnosis not present

## 2019-07-03 DIAGNOSIS — Z3189 Encounter for other procreative management: Secondary | ICD-10-CM | POA: Diagnosis not present

## 2019-07-03 DIAGNOSIS — F17200 Nicotine dependence, unspecified, uncomplicated: Secondary | ICD-10-CM | POA: Diagnosis not present

## 2019-07-03 DIAGNOSIS — I10 Essential (primary) hypertension: Secondary | ICD-10-CM | POA: Diagnosis not present

## 2019-07-03 DIAGNOSIS — E663 Overweight: Secondary | ICD-10-CM | POA: Diagnosis not present

## 2019-07-03 DIAGNOSIS — Z903 Acquired absence of stomach [part of]: Secondary | ICD-10-CM | POA: Diagnosis not present

## 2019-07-03 DIAGNOSIS — Z9081 Acquired absence of spleen: Secondary | ICD-10-CM | POA: Diagnosis not present

## 2019-07-10 DIAGNOSIS — R32 Unspecified urinary incontinence: Secondary | ICD-10-CM | POA: Diagnosis not present

## 2019-07-14 DIAGNOSIS — F419 Anxiety disorder, unspecified: Secondary | ICD-10-CM | POA: Diagnosis not present

## 2019-07-14 DIAGNOSIS — M47812 Spondylosis without myelopathy or radiculopathy, cervical region: Secondary | ICD-10-CM | POA: Diagnosis not present

## 2019-07-14 DIAGNOSIS — M47816 Spondylosis without myelopathy or radiculopathy, lumbar region: Secondary | ICD-10-CM | POA: Diagnosis not present

## 2019-07-14 DIAGNOSIS — F329 Major depressive disorder, single episode, unspecified: Secondary | ICD-10-CM | POA: Diagnosis not present

## 2019-07-31 ENCOUNTER — Emergency Department (HOSPITAL_COMMUNITY): Payer: Medicare HMO

## 2019-07-31 ENCOUNTER — Encounter (HOSPITAL_COMMUNITY): Payer: Self-pay

## 2019-07-31 ENCOUNTER — Emergency Department (HOSPITAL_COMMUNITY)
Admission: EM | Admit: 2019-07-31 | Discharge: 2019-07-31 | Disposition: A | Discharge status: Home / Self Care | Payer: Medicare HMO | Attending: Emergency Medicine | Admitting: Emergency Medicine

## 2019-07-31 ENCOUNTER — Other Ambulatory Visit: Payer: Self-pay

## 2019-07-31 DIAGNOSIS — R519 Headache, unspecified: Secondary | ICD-10-CM | POA: Diagnosis not present

## 2019-07-31 DIAGNOSIS — M542 Cervicalgia: Secondary | ICD-10-CM | POA: Diagnosis not present

## 2019-07-31 DIAGNOSIS — I10 Essential (primary) hypertension: Secondary | ICD-10-CM | POA: Diagnosis not present

## 2019-07-31 DIAGNOSIS — Y999 Unspecified external cause status: Secondary | ICD-10-CM | POA: Diagnosis not present

## 2019-07-31 DIAGNOSIS — F17210 Nicotine dependence, cigarettes, uncomplicated: Secondary | ICD-10-CM | POA: Diagnosis not present

## 2019-07-31 DIAGNOSIS — E119 Type 2 diabetes mellitus without complications: Secondary | ICD-10-CM | POA: Insufficient documentation

## 2019-07-31 DIAGNOSIS — Y9389 Activity, other specified: Secondary | ICD-10-CM | POA: Diagnosis not present

## 2019-07-31 DIAGNOSIS — Y92410 Unspecified street and highway as the place of occurrence of the external cause: Secondary | ICD-10-CM | POA: Diagnosis not present

## 2019-07-31 DIAGNOSIS — M25561 Pain in right knee: Secondary | ICD-10-CM

## 2019-07-31 DIAGNOSIS — S4991XA Unspecified injury of right shoulder and upper arm, initial encounter: Secondary | ICD-10-CM

## 2019-07-31 MED ORDER — ACETAMINOPHEN 325 MG PO TABS
650.0000 mg | ORAL_TABLET | Freq: Once | ORAL | Status: AC
  Administered 2019-07-31: 17:00:00 650 mg via ORAL
  Filled 2019-07-31: qty 2

## 2019-07-31 NOTE — ED Notes (Signed)
An After Visit Summary was printed and given to the patient. Discharge instructions given and no further questions at this time.  
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
## 2019-08-31 NOTE — Congregational Nurse Program (Signed)
No complaints or concerns BP 147/89 P 86 Discussed importance of taking medications as prescribed, eating a proper diet, getting exercise and proper rest. Voiced understanding Jenene Slicker RN, [facility] [phone]

## 2019-10-02 DIAGNOSIS — M25572 Pain in left ankle and joints of left foot: Secondary | ICD-10-CM | POA: Diagnosis not present

## 2019-10-02 DIAGNOSIS — F1120 Opioid dependence, uncomplicated: Secondary | ICD-10-CM | POA: Diagnosis not present

## 2019-10-02 DIAGNOSIS — M79642 Pain in left hand: Secondary | ICD-10-CM | POA: Diagnosis not present

## 2019-10-02 DIAGNOSIS — R52 Pain, unspecified: Secondary | ICD-10-CM | POA: Diagnosis not present

## 2019-10-02 DIAGNOSIS — M25561 Pain in right knee: Secondary | ICD-10-CM | POA: Diagnosis not present

## 2019-10-02 DIAGNOSIS — Z79891 Long term (current) use of opiate analgesic: Secondary | ICD-10-CM | POA: Diagnosis not present

## 2019-10-02 DIAGNOSIS — G8929 Other chronic pain: Secondary | ICD-10-CM | POA: Diagnosis not present

## 2019-10-02 DIAGNOSIS — G894 Chronic pain syndrome: Secondary | ICD-10-CM | POA: Diagnosis not present

## 2019-10-02 DIAGNOSIS — M25571 Pain in right ankle and joints of right foot: Secondary | ICD-10-CM | POA: Diagnosis not present

## 2019-10-02 DIAGNOSIS — M79641 Pain in right hand: Secondary | ICD-10-CM | POA: Diagnosis not present

## 2019-10-02 DIAGNOSIS — R519 Headache, unspecified: Secondary | ICD-10-CM | POA: Diagnosis not present

## 2019-10-02 DIAGNOSIS — M79604 Pain in right leg: Secondary | ICD-10-CM | POA: Diagnosis not present

## 2019-10-02 DIAGNOSIS — M5136 Other intervertebral disc degeneration, lumbar region: Secondary | ICD-10-CM | POA: Diagnosis not present

## 2019-10-09 DIAGNOSIS — Z789 Other specified health status: Secondary | ICD-10-CM | POA: Diagnosis not present

## 2019-10-09 DIAGNOSIS — I10 Essential (primary) hypertension: Secondary | ICD-10-CM | POA: Diagnosis not present

## 2019-10-09 DIAGNOSIS — E782 Mixed hyperlipidemia: Secondary | ICD-10-CM | POA: Diagnosis not present

## 2019-10-14 DIAGNOSIS — R32 Unspecified urinary incontinence: Secondary | ICD-10-CM | POA: Diagnosis not present

## 2019-10-30 DIAGNOSIS — M5136 Other intervertebral disc degeneration, lumbar region: Secondary | ICD-10-CM | POA: Diagnosis not present

## 2019-10-30 DIAGNOSIS — M79604 Pain in right leg: Secondary | ICD-10-CM | POA: Diagnosis not present

## 2019-10-30 DIAGNOSIS — M79641 Pain in right hand: Secondary | ICD-10-CM | POA: Diagnosis not present

## 2019-10-30 DIAGNOSIS — M79642 Pain in left hand: Secondary | ICD-10-CM | POA: Diagnosis not present

## 2019-10-30 DIAGNOSIS — J3089 Other allergic rhinitis: Secondary | ICD-10-CM | POA: Diagnosis not present

## 2019-10-30 DIAGNOSIS — M25572 Pain in left ankle and joints of left foot: Secondary | ICD-10-CM | POA: Diagnosis not present

## 2019-10-30 DIAGNOSIS — G894 Chronic pain syndrome: Secondary | ICD-10-CM | POA: Diagnosis not present

## 2019-10-30 DIAGNOSIS — J302 Other seasonal allergic rhinitis: Secondary | ICD-10-CM | POA: Diagnosis not present

## 2019-10-30 DIAGNOSIS — J3081 Allergic rhinitis due to animal (cat) (dog) hair and dander: Secondary | ICD-10-CM | POA: Diagnosis not present

## 2019-10-30 DIAGNOSIS — M25561 Pain in right knee: Secondary | ICD-10-CM | POA: Diagnosis not present

## 2019-10-30 DIAGNOSIS — Z79891 Long term (current) use of opiate analgesic: Secondary | ICD-10-CM | POA: Diagnosis not present

## 2019-10-30 DIAGNOSIS — F1120 Opioid dependence, uncomplicated: Secondary | ICD-10-CM | POA: Diagnosis not present

## 2019-10-30 DIAGNOSIS — G8929 Other chronic pain: Secondary | ICD-10-CM | POA: Diagnosis not present

## 2019-10-30 DIAGNOSIS — M25571 Pain in right ankle and joints of right foot: Secondary | ICD-10-CM | POA: Diagnosis not present

## 2019-11-06 DIAGNOSIS — J3089 Other allergic rhinitis: Secondary | ICD-10-CM | POA: Diagnosis not present

## 2019-11-06 DIAGNOSIS — J302 Other seasonal allergic rhinitis: Secondary | ICD-10-CM | POA: Diagnosis not present

## 2019-11-06 DIAGNOSIS — J3081 Allergic rhinitis due to animal (cat) (dog) hair and dander: Secondary | ICD-10-CM | POA: Diagnosis not present

## 2019-11-07 DIAGNOSIS — J302 Other seasonal allergic rhinitis: Secondary | ICD-10-CM | POA: Diagnosis not present

## 2019-11-07 DIAGNOSIS — J3081 Allergic rhinitis due to animal (cat) (dog) hair and dander: Secondary | ICD-10-CM | POA: Diagnosis not present

## 2019-11-07 DIAGNOSIS — J3089 Other allergic rhinitis: Secondary | ICD-10-CM | POA: Diagnosis not present

## 2019-11-09 DIAGNOSIS — Z789 Other specified health status: Secondary | ICD-10-CM | POA: Diagnosis not present

## 2019-11-09 DIAGNOSIS — I10 Essential (primary) hypertension: Secondary | ICD-10-CM | POA: Diagnosis not present

## 2019-11-09 DIAGNOSIS — E782 Mixed hyperlipidemia: Secondary | ICD-10-CM | POA: Diagnosis not present

## 2019-11-09 DIAGNOSIS — J3081 Allergic rhinitis due to animal (cat) (dog) hair and dander: Secondary | ICD-10-CM | POA: Diagnosis not present

## 2019-11-09 DIAGNOSIS — J302 Other seasonal allergic rhinitis: Secondary | ICD-10-CM | POA: Diagnosis not present

## 2019-11-09 DIAGNOSIS — J3089 Other allergic rhinitis: Secondary | ICD-10-CM | POA: Diagnosis not present

## 2019-11-10 DIAGNOSIS — J302 Other seasonal allergic rhinitis: Secondary | ICD-10-CM | POA: Diagnosis not present

## 2019-11-10 DIAGNOSIS — J3089 Other allergic rhinitis: Secondary | ICD-10-CM | POA: Diagnosis not present

## 2019-11-10 DIAGNOSIS — J3081 Allergic rhinitis due to animal (cat) (dog) hair and dander: Secondary | ICD-10-CM | POA: Diagnosis not present

## 2019-11-11 DIAGNOSIS — J3081 Allergic rhinitis due to animal (cat) (dog) hair and dander: Secondary | ICD-10-CM | POA: Diagnosis not present

## 2019-11-11 DIAGNOSIS — J302 Other seasonal allergic rhinitis: Secondary | ICD-10-CM | POA: Diagnosis not present

## 2019-11-11 DIAGNOSIS — J3089 Other allergic rhinitis: Secondary | ICD-10-CM | POA: Diagnosis not present

## 2019-11-12 DIAGNOSIS — J302 Other seasonal allergic rhinitis: Secondary | ICD-10-CM | POA: Diagnosis not present

## 2019-11-12 DIAGNOSIS — J3081 Allergic rhinitis due to animal (cat) (dog) hair and dander: Secondary | ICD-10-CM | POA: Diagnosis not present

## 2019-11-12 DIAGNOSIS — J3089 Other allergic rhinitis: Secondary | ICD-10-CM | POA: Diagnosis not present

## 2019-11-12 DIAGNOSIS — M79641 Pain in right hand: Secondary | ICD-10-CM | POA: Diagnosis not present

## 2019-11-13 DIAGNOSIS — J3081 Allergic rhinitis due to animal (cat) (dog) hair and dander: Secondary | ICD-10-CM | POA: Diagnosis not present

## 2019-11-13 DIAGNOSIS — J3089 Other allergic rhinitis: Secondary | ICD-10-CM | POA: Diagnosis not present

## 2019-11-13 DIAGNOSIS — M25571 Pain in right ankle and joints of right foot: Secondary | ICD-10-CM | POA: Diagnosis not present

## 2019-11-13 DIAGNOSIS — M79642 Pain in left hand: Secondary | ICD-10-CM | POA: Diagnosis not present

## 2019-11-13 DIAGNOSIS — J302 Other seasonal allergic rhinitis: Secondary | ICD-10-CM | POA: Diagnosis not present

## 2019-11-13 DIAGNOSIS — M79641 Pain in right hand: Secondary | ICD-10-CM | POA: Diagnosis not present

## 2019-11-13 DIAGNOSIS — M25561 Pain in right knee: Secondary | ICD-10-CM | POA: Diagnosis not present

## 2019-11-24 DIAGNOSIS — E785 Hyperlipidemia, unspecified: Secondary | ICD-10-CM | POA: Diagnosis not present

## 2019-11-24 DIAGNOSIS — Z803 Family history of malignant neoplasm of breast: Secondary | ICD-10-CM | POA: Diagnosis not present

## 2019-11-24 DIAGNOSIS — R32 Unspecified urinary incontinence: Secondary | ICD-10-CM | POA: Diagnosis not present

## 2019-11-24 DIAGNOSIS — I10 Essential (primary) hypertension: Secondary | ICD-10-CM | POA: Diagnosis not present

## 2019-11-24 DIAGNOSIS — E119 Type 2 diabetes mellitus without complications: Secondary | ICD-10-CM | POA: Diagnosis not present

## 2019-11-24 DIAGNOSIS — Z7722 Contact with and (suspected) exposure to environmental tobacco smoke (acute) (chronic): Secondary | ICD-10-CM | POA: Diagnosis not present

## 2019-11-24 DIAGNOSIS — Z8249 Family history of ischemic heart disease and other diseases of the circulatory system: Secondary | ICD-10-CM | POA: Diagnosis not present

## 2019-11-24 DIAGNOSIS — Z820 Family history of epilepsy and other diseases of the nervous system: Secondary | ICD-10-CM | POA: Diagnosis not present

## 2019-11-24 DIAGNOSIS — R69 Illness, unspecified: Secondary | ICD-10-CM | POA: Diagnosis not present

## 2019-11-24 DIAGNOSIS — Z791 Long term (current) use of non-steroidal anti-inflammatories (NSAID): Secondary | ICD-10-CM | POA: Diagnosis not present

## 2019-11-27 DIAGNOSIS — M5136 Other intervertebral disc degeneration, lumbar region: Secondary | ICD-10-CM | POA: Diagnosis not present

## 2019-11-27 DIAGNOSIS — Z79891 Long term (current) use of opiate analgesic: Secondary | ICD-10-CM | POA: Diagnosis not present

## 2019-11-27 DIAGNOSIS — G8929 Other chronic pain: Secondary | ICD-10-CM | POA: Diagnosis not present

## 2019-11-27 DIAGNOSIS — M79642 Pain in left hand: Secondary | ICD-10-CM | POA: Diagnosis not present

## 2019-11-27 DIAGNOSIS — R52 Pain, unspecified: Secondary | ICD-10-CM | POA: Diagnosis not present

## 2019-11-27 DIAGNOSIS — J3081 Allergic rhinitis due to animal (cat) (dog) hair and dander: Secondary | ICD-10-CM | POA: Diagnosis not present

## 2019-11-27 DIAGNOSIS — M79641 Pain in right hand: Secondary | ICD-10-CM | POA: Diagnosis not present

## 2019-11-27 DIAGNOSIS — M79604 Pain in right leg: Secondary | ICD-10-CM | POA: Diagnosis not present

## 2019-11-27 DIAGNOSIS — R519 Headache, unspecified: Secondary | ICD-10-CM | POA: Diagnosis not present

## 2019-11-27 DIAGNOSIS — M25572 Pain in left ankle and joints of left foot: Secondary | ICD-10-CM | POA: Diagnosis not present

## 2019-11-27 DIAGNOSIS — F1120 Opioid dependence, uncomplicated: Secondary | ICD-10-CM | POA: Diagnosis not present

## 2019-11-27 DIAGNOSIS — M25571 Pain in right ankle and joints of right foot: Secondary | ICD-10-CM | POA: Diagnosis not present

## 2019-11-27 DIAGNOSIS — M25561 Pain in right knee: Secondary | ICD-10-CM | POA: Diagnosis not present

## 2019-11-27 DIAGNOSIS — G894 Chronic pain syndrome: Secondary | ICD-10-CM | POA: Diagnosis not present

## 2019-12-07 DIAGNOSIS — R32 Unspecified urinary incontinence: Secondary | ICD-10-CM | POA: Diagnosis not present

## 2019-12-22 DIAGNOSIS — E782 Mixed hyperlipidemia: Secondary | ICD-10-CM | POA: Diagnosis not present

## 2019-12-22 DIAGNOSIS — I10 Essential (primary) hypertension: Secondary | ICD-10-CM | POA: Diagnosis not present

## 2019-12-22 DIAGNOSIS — Z789 Other specified health status: Secondary | ICD-10-CM | POA: Diagnosis not present

## 2019-12-24 DIAGNOSIS — H2512 Age-related nuclear cataract, left eye: Secondary | ICD-10-CM | POA: Diagnosis not present

## 2019-12-24 DIAGNOSIS — H25811 Combined forms of age-related cataract, right eye: Secondary | ICD-10-CM | POA: Diagnosis not present

## 2019-12-25 DIAGNOSIS — G8929 Other chronic pain: Secondary | ICD-10-CM | POA: Diagnosis not present

## 2019-12-25 DIAGNOSIS — M25571 Pain in right ankle and joints of right foot: Secondary | ICD-10-CM | POA: Diagnosis not present

## 2019-12-25 DIAGNOSIS — M25572 Pain in left ankle and joints of left foot: Secondary | ICD-10-CM | POA: Diagnosis not present

## 2019-12-25 DIAGNOSIS — F1120 Opioid dependence, uncomplicated: Secondary | ICD-10-CM | POA: Diagnosis not present

## 2019-12-25 DIAGNOSIS — G894 Chronic pain syndrome: Secondary | ICD-10-CM | POA: Diagnosis not present

## 2019-12-25 DIAGNOSIS — J3089 Other allergic rhinitis: Secondary | ICD-10-CM | POA: Diagnosis not present

## 2019-12-25 DIAGNOSIS — J302 Other seasonal allergic rhinitis: Secondary | ICD-10-CM | POA: Diagnosis not present

## 2019-12-25 DIAGNOSIS — Z79891 Long term (current) use of opiate analgesic: Secondary | ICD-10-CM | POA: Diagnosis not present

## 2019-12-25 DIAGNOSIS — M79641 Pain in right hand: Secondary | ICD-10-CM | POA: Diagnosis not present

## 2019-12-25 DIAGNOSIS — M5136 Other intervertebral disc degeneration, lumbar region: Secondary | ICD-10-CM | POA: Diagnosis not present

## 2019-12-25 DIAGNOSIS — M25561 Pain in right knee: Secondary | ICD-10-CM | POA: Diagnosis not present

## 2019-12-25 DIAGNOSIS — M79642 Pain in left hand: Secondary | ICD-10-CM | POA: Diagnosis not present

## 2019-12-25 DIAGNOSIS — M79604 Pain in right leg: Secondary | ICD-10-CM | POA: Diagnosis not present

## 2019-12-25 DIAGNOSIS — J3081 Allergic rhinitis due to animal (cat) (dog) hair and dander: Secondary | ICD-10-CM | POA: Diagnosis not present

## 2020-01-22 DIAGNOSIS — M79642 Pain in left hand: Secondary | ICD-10-CM | POA: Diagnosis not present

## 2020-01-22 DIAGNOSIS — G894 Chronic pain syndrome: Secondary | ICD-10-CM | POA: Diagnosis not present

## 2020-01-22 DIAGNOSIS — M79604 Pain in right leg: Secondary | ICD-10-CM | POA: Diagnosis not present

## 2020-01-22 DIAGNOSIS — G8929 Other chronic pain: Secondary | ICD-10-CM | POA: Diagnosis not present

## 2020-01-22 DIAGNOSIS — F1120 Opioid dependence, uncomplicated: Secondary | ICD-10-CM | POA: Diagnosis not present

## 2020-01-22 DIAGNOSIS — M25571 Pain in right ankle and joints of right foot: Secondary | ICD-10-CM | POA: Diagnosis not present

## 2020-01-22 DIAGNOSIS — J3081 Allergic rhinitis due to animal (cat) (dog) hair and dander: Secondary | ICD-10-CM | POA: Diagnosis not present

## 2020-01-22 DIAGNOSIS — M79641 Pain in right hand: Secondary | ICD-10-CM | POA: Diagnosis not present

## 2020-01-22 DIAGNOSIS — J302 Other seasonal allergic rhinitis: Secondary | ICD-10-CM | POA: Diagnosis not present

## 2020-01-22 DIAGNOSIS — J3089 Other allergic rhinitis: Secondary | ICD-10-CM | POA: Diagnosis not present

## 2020-01-22 DIAGNOSIS — M25561 Pain in right knee: Secondary | ICD-10-CM | POA: Diagnosis not present

## 2020-01-22 DIAGNOSIS — Z79891 Long term (current) use of opiate analgesic: Secondary | ICD-10-CM | POA: Diagnosis not present

## 2020-01-22 DIAGNOSIS — M25572 Pain in left ankle and joints of left foot: Secondary | ICD-10-CM | POA: Diagnosis not present

## 2020-01-22 DIAGNOSIS — M5136 Other intervertebral disc degeneration, lumbar region: Secondary | ICD-10-CM | POA: Diagnosis not present

## 2020-02-19 DIAGNOSIS — M25571 Pain in right ankle and joints of right foot: Secondary | ICD-10-CM | POA: Diagnosis not present

## 2020-02-19 DIAGNOSIS — M5136 Other intervertebral disc degeneration, lumbar region: Secondary | ICD-10-CM | POA: Diagnosis not present

## 2020-02-19 DIAGNOSIS — G8929 Other chronic pain: Secondary | ICD-10-CM | POA: Diagnosis not present

## 2020-02-19 DIAGNOSIS — M25561 Pain in right knee: Secondary | ICD-10-CM | POA: Diagnosis not present

## 2020-02-19 DIAGNOSIS — F1120 Opioid dependence, uncomplicated: Secondary | ICD-10-CM | POA: Diagnosis not present

## 2020-02-19 DIAGNOSIS — J3081 Allergic rhinitis due to animal (cat) (dog) hair and dander: Secondary | ICD-10-CM | POA: Diagnosis not present

## 2020-02-19 DIAGNOSIS — R52 Pain, unspecified: Secondary | ICD-10-CM | POA: Diagnosis not present

## 2020-02-19 DIAGNOSIS — J321 Chronic frontal sinusitis: Secondary | ICD-10-CM | POA: Diagnosis not present

## 2020-02-19 DIAGNOSIS — Z79891 Long term (current) use of opiate analgesic: Secondary | ICD-10-CM | POA: Diagnosis not present

## 2020-02-19 DIAGNOSIS — G894 Chronic pain syndrome: Secondary | ICD-10-CM | POA: Diagnosis not present

## 2020-02-19 DIAGNOSIS — J302 Other seasonal allergic rhinitis: Secondary | ICD-10-CM | POA: Diagnosis not present

## 2020-02-19 DIAGNOSIS — M25572 Pain in left ankle and joints of left foot: Secondary | ICD-10-CM | POA: Diagnosis not present

## 2020-02-19 DIAGNOSIS — J3089 Other allergic rhinitis: Secondary | ICD-10-CM | POA: Diagnosis not present

## 2020-02-19 DIAGNOSIS — M79604 Pain in right leg: Secondary | ICD-10-CM | POA: Diagnosis not present

## 2020-02-20 ENCOUNTER — Emergency Department (HOSPITAL_COMMUNITY): Payer: Medicare HMO

## 2020-02-20 ENCOUNTER — Other Ambulatory Visit: Payer: Self-pay

## 2020-02-20 ENCOUNTER — Emergency Department (HOSPITAL_COMMUNITY)
Admission: EM | Admit: 2020-02-20 | Discharge: 2020-02-20 | Disposition: A | Discharge status: Home / Self Care | Payer: Medicare HMO | Attending: Emergency Medicine | Admitting: Emergency Medicine

## 2020-02-20 ENCOUNTER — Encounter (HOSPITAL_COMMUNITY): Payer: Self-pay

## 2020-02-20 DIAGNOSIS — R070 Pain in throat: Secondary | ICD-10-CM | POA: Insufficient documentation

## 2020-02-20 DIAGNOSIS — F17210 Nicotine dependence, cigarettes, uncomplicated: Secondary | ICD-10-CM | POA: Diagnosis not present

## 2020-02-20 DIAGNOSIS — Z79899 Other long term (current) drug therapy: Secondary | ICD-10-CM | POA: Diagnosis not present

## 2020-02-20 DIAGNOSIS — R5383 Other fatigue: Secondary | ICD-10-CM | POA: Diagnosis not present

## 2020-02-20 DIAGNOSIS — Z20822 Contact with and (suspected) exposure to covid-19: Secondary | ICD-10-CM | POA: Diagnosis not present

## 2020-02-20 DIAGNOSIS — I10 Essential (primary) hypertension: Secondary | ICD-10-CM | POA: Insufficient documentation

## 2020-02-20 DIAGNOSIS — R059 Cough, unspecified: Secondary | ICD-10-CM | POA: Diagnosis not present

## 2020-02-20 DIAGNOSIS — R69 Illness, unspecified: Secondary | ICD-10-CM | POA: Diagnosis not present

## 2020-02-20 DIAGNOSIS — E119 Type 2 diabetes mellitus without complications: Secondary | ICD-10-CM | POA: Insufficient documentation

## 2020-02-20 LAB — PREGNANCY, URINE: Preg Test, Ur: NEGATIVE

## 2020-02-20 MED ORDER — ALBUTEROL SULFATE HFA 108 (90 BASE) MCG/ACT IN AERS
2.0000 | INHALATION_SPRAY | Freq: Once | RESPIRATORY_TRACT | Status: AC
  Administered 2020-02-20: 2 via RESPIRATORY_TRACT
  Filled 2020-02-20: qty 6.7

## 2020-02-20 NOTE — ED Notes (Signed)
Pt states she can no provide a sample at this time. A second cup of water given to patient. Will follow up.

## 2020-02-20 NOTE — Discharge Instructions (Addendum)
We have tested you for COVID 19 at this time. Please self isolate until you receive your results. We will call you if you test positive. You can also log into MyChart and check the results that way.   Use the albuterol inhaler as needed for shortness of breath.   Follow up with your PCP regarding your ED visit today and for recheck of your symptoms.   Return to the ED for any worsening symptoms.

## 2020-02-20 NOTE — ED Notes (Signed)
Pt ambulated with no issues. sats at rest are 100% on RA and sats ambulating are 99% on RA.

## 2020-02-20 NOTE — ED Notes (Signed)
Provided patient with drink and crackers.

## 2020-02-20 NOTE — ED Triage Notes (Signed)
Pt presents to ED with cough, sore throat, fatigue x 2 weeks. Pt states she has had her covid vaccines.

## 2020-02-20 NOTE — ED Notes (Signed)
Pt ambulated to restroom without informing nurse. No urine obtained at this time. Pt made aware we need a sample next time she needs to urinate.

## 2020-02-20 NOTE — ED Provider Notes (Signed)
[facility] EMERGENCY DEPARTMENT Provider Note   CSN: [id number] Arrival date & time: 02/20/20  4270     History Chief Complaint  Patient presents with  . Cough    Cheryl Stokes is a 45 y.o. female with PMHx HTN and Diabetes with PSHx splenectomy who presents to the ED today with concern for COVID exposure.  She reports that approximately 2 weeks ago she was around her neighbor who told her that she was Covid positive.  The neighbor was not wearing a mask despite having an active COVID-19 infection.  Patient reports that shortly afterwards she began having a cough, sore throat, fatigue.  She also reports she is having body aches.  She states that she has been staying at home and taking over-the-counter medications however this morning she felt like she was very short of breath opting her to come to the ED today.  She states she wants to make sure that she does not have Covid.  She states she is scheduled to have cataract surgery next week and wants to be safe.  She is also requesting a pregnancy test at this time.  She states that she is 7 days late on her menstrual cycle and has been actively trying to get pregnant.  Patient does not believe she has had fevers, she sees pain clinic for chronic back pain and saw them earlier this past week with normal vitals.  She denies any difficulty swallowing, voice change, drooling, chest pain, coughing up blood, abdominal pain, recent vomiting (1 episode about 1.5 weeks ago), diarrhea, any other associated symptoms.  The history is provided by the patient and medical records.       Past Medical History:  Diagnosis Date  . Diabetes mellitus without complication (HCC)   . Hypertension   . Sleep apnea     There are no problems to display for this patient.   Past Surgical History:  Procedure Laterality Date  . BARIATRIC SURGERY  03/2015   Sleve  . SPLENECTOMY    . TONSILLECTOMY       OB History   No obstetric history on file.     No  family history on file.  Social History   Tobacco Use  . Smoking status: Current Every Day Smoker    Packs/day: 0.00    Types: Cigarettes  . Smokeless tobacco: Never Used  Vaping Use  . Vaping Use: Never used  Substance Use Topics  . Alcohol use: No  . Drug use: No    Home Medications Prior to Admission medications   Medication Sig Start Date End Date Taking? Authorizing Provider  acetaminophen (TYLENOL) 325 MG tablet Take 2 tablets (650 mg total) by mouth every 6 (six) hours as needed for moderate pain. Patient not taking: Reported on 11/18/2017 02/11/16   Mesner, Barbara Cower, MD  amLODipine (NORVASC) 10 MG tablet Take 10 mg by mouth daily as needed (blood pressure).     [provider]  atorvastatin (LIPITOR) 20 MG tablet Take 20 mg by mouth daily.  03/20/16   [provider]  benzonatate (TESSALON) 100 MG capsule Take 1 capsule (100 mg total) by mouth every 8 (eight) hours. Patient not taking: Reported on 11/18/2017 02/11/16   Mesner, Barbara Cower, MD  Calcium Carb-Ergocalciferol 250-125 MG-UNIT TABS Take 1 tablet by mouth daily.  04/13/16   [provider]  calcium-vitamin D (OSCAL WITH D) 250-125 MG-UNIT tablet Take 1 tablet by mouth daily.    [provider]  Canagliflozin-Metformin HCl (INVOKAMET PO)  Take by mouth.    [provider]  cholecalciferol (VITAMIN D) 25 MCG (1000 UNIT) tablet Take 1,000 Units by mouth daily.    [provider]  clonazePAM (KLONOPIN) 1 MG tablet Take 1 mg by mouth 2 (two) times daily.    [provider]  cyclobenzaprine (FLEXERIL) 10 MG tablet Take 1 tablet (10 mg total) by mouth 3 (three) times daily as needed for muscle spasms. Patient not taking: Reported on 11/18/2017 07/29/16   Geoffery Lyons, MD  EPINEPHrine 0.3 mg/0.3 mL IJ SOAJ injection Inject 0.3 mg into the muscle once as needed (Allergies).  12/09/13   [provider]  ferrous sulfate 325 (65 FE) MG tablet Take 325 mg by mouth daily.   03/20/16   [provider]  ferrous sulfate 325 (65 FE) MG tablet Take 325 mg by mouth daily.    [provider]  Fluconazole (DIFLUCAN PO) Take 150 mg by mouth once.     [provider]  ibuprofen (ADVIL,MOTRIN) 800 MG tablet Take 800 mg by mouth every 8 (eight) hours as needed for mild pain.     [provider]  methocarbamol (ROBAXIN) 500 MG tablet Take 1 tablet (500 mg total) by mouth 2 (two) times daily. Patient taking differently: Take 500 mg by mouth 2 (two) times daily as needed for muscle spasms.  08/01/16   Khatri, Hina, PA-C  Multiple Vitamin (MULTIVITAMIN) tablet Take 1 tablet by mouth daily.    [provider]  Multiple Vitamins-Minerals (ZINC PO) Take 22 mg by mouth daily.    [provider]  nystatin (MYCOSTATIN/NYSTOP) powder Apply 1 g topically daily as needed (dry skin).  11/04/17   [provider]  ondansetron (ZOFRAN ODT) 4 MG disintegrating tablet 4mg  ODT q4 hours prn nausea/vomit Patient not taking: Reported on 11/18/2017 02/11/16   Mesner, Barbara Cower, MD  Oxycodone HCl 10 MG TABS Take 10 mg by mouth 4 (four) times daily as needed (pain/pinched nerves).  06/21/14   [provider]  pantoprazole (PROTONIX) 40 MG tablet Take 40 mg by mouth 2 (two) times daily. 11/07/17   [provider]  predniSONE (DELTASONE) 10 MG tablet Take 2 tablets (20 mg total) by mouth 2 (two) times daily. Patient not taking: Reported on 11/18/2017 07/29/16   Geoffery Lyons, MD  pregabalin (LYRICA) 150 MG capsule Take 150 mg by mouth 3 (three) times daily as needed (pinched nerves).     [provider]  promethazine (PHENERGAN) 25 MG tablet Take 25 mg by mouth every 6 (six) hours as needed. 11/07/17   [provider]  vitamin B-12 (CYANOCOBALAMIN) 1000 MCG tablet Take 1,000 mcg by mouth daily.  04/13/16   [provider]  Vitamin D, Ergocalciferol, (DRISDOL) 50000 units CAPS capsule Take 50,000 Units by mouth  every 7 (seven) days.     [provider]    Allergies    Bee venom, Tomato, Metformin and related, and Mobic [meloxicam]  Review of Systems   Review of Systems  Constitutional: Positive for fatigue. Negative for chills and fever.  HENT: Positive for sore throat. Negative for trouble swallowing and voice change.   Respiratory: Positive for cough and shortness of breath.   Cardiovascular: Negative for chest pain.  Gastrointestinal: Negative for abdominal pain, diarrhea, nausea and vomiting.  Genitourinary: Positive for menstrual problem.  All other systems reviewed and are negative.   Physical Exam Updated Vital Signs BP (!) 153/77 (BP Location: Right Arm)   Pulse 78  Temp 98.5 F (36.9 C) (Oral)   Resp 18   Ht 5\' 9"  (1.753 m)   Wt 81.6 kg   SpO2 100%   BMI 26.58 kg/m   Physical Exam Vitals and nursing note reviewed.  Constitutional:      Appearance: She is obese. She is not ill-appearing or diaphoretic.  HENT:     Head: Normocephalic and atraumatic.     Mouth/Throat:     Mouth: Mucous membranes are moist.     Pharynx: Uvula midline. Posterior oropharyngeal erythema present. No oropharyngeal exudate.     Tonsils: No tonsillar exudate.     Comments: Phonating normally Eyes:     Conjunctiva/sclera: Conjunctivae normal.  Cardiovascular:     Rate and Rhythm: Normal rate and regular rhythm.     Pulses: Normal pulses.  Pulmonary:     Effort: Pulmonary effort is normal.     Breath sounds: Normal breath sounds. No wheezing, rhonchi or rales.     Comments: Speaking in full sentences without difficulty. Satting 100% on RA. Not actively coughing. Abdominal:     Palpations: Abdomen is soft.     Tenderness: There is no abdominal tenderness. There is no guarding or rebound.  Musculoskeletal:     Cervical back: Neck supple.  Skin:    General: Skin is warm and dry.     Coloration: Skin is not jaundiced.  Neurological:     Mental Status: She is alert.     ED  Results / Procedures / Treatments   Labs (all labs ordered are listed, but only abnormal results are displayed) Labs Reviewed  SARS CORONAVIRUS 2 (TAT 6-24 HRS)  PREGNANCY, URINE    EKG None  Radiology DG Chest Port 1 View  Result Date: 02/20/2020 CLINICAL DATA:  Cough EXAM: PORTABLE CHEST 1 VIEW COMPARISON:  July 31, 2019 FINDINGS: The cardiomediastinal silhouette is normal in contour. No pleural effusion. No pneumothorax. No acute pleuroparenchymal abnormality. Visualized abdomen is unremarkable. No acute osseous abnormality noted. IMPRESSION: No acute cardiopulmonary abnormality. Electronically Signed   By: August 02, 2019 MD   On: 02/20/2020 12:13    Procedures Procedures   Medications Ordered in ED Medications  albuterol (VENTOLIN HFA) 108 (90 Base) MCG/ACT inhaler 2 puff (has no administration in time range)    ED Course  I have reviewed the triage vital signs and the nursing notes.  Pertinent labs & imaging results that were available during my care of the patient were reviewed by me and considered in my medical decision making (see chart for details).    MDM Rules/Calculators/A&P                          45 year old female who is here today with concern for Covid symptoms.  She reports she was recently exposed 2 weeks ago and began having symptoms worsening today.  Would also like a pregnancy test today she is 7 days late and actively trying to get pregnant at the age of 69.  On arrival to the ED vitals are stable.  Patient is afebrile, nontachycardic nontachypneic.  She is satting 100% on room air.  She is overall well-appearing at this time.  She is speaking in full sentences and laughing.  She does not appear to be in any type of acute respiratory distress at this time.  She does have posterior oropharyngeal erythema however no edema, no exudate, uvula is midline.  There is no signs concerning for PTA or other deep  space infection of the neck.  I have very low suspicion  for strep throat at this time given her positive Covid exposure as well as a cough.  Centor criteria -1.  Not feel she needs strep test at this time.  We will plan to swab Covid with 6 to 24-hour send out test.  We will obtain an chest x-ray at this time given cough as well as history of splenectomy.  We will also obtain urine pregnancy test.  No acute findings will plan to discharge home with symptomatic treatment.  Patient will be provided albuterol inhaler for her complaints of shortness of breath.  We will plan to ambulate patient in the ER prior to discharge to ensure she does not desat. I have very low suspicion for PE at this time given no chest pain and no tachycardia or hypoxia on exam today.   CXR clear Urine pregnancy test negative  Pt ambulated with O2 sats remaining at 100% on RA. Will discharge pt home at this time with albuterol inhaler. Pt instructed to self isolate until she receives her results - she has had symptoms for 2 weeks and is therefore not a candidate for antivirals. Pt to follow up with PCP for same. She is in agreement with plan and stable for discharge.   This note was prepared using Dragon voice recognition software and may include unintentional dictation errors due to the inherent limitations of voice recognition software.  Cheryl Stokes was evaluated in Emergency Department on 02/20/2020 for the symptoms described in the history of present illness. She was evaluated in the context of the global COVID-19 pandemic, which necessitated consideration that the patient might be at risk for infection with the SARS-CoV-2 virus that causes COVID-19. Institutional protocols and algorithms that pertain to the evaluation of patients at risk for COVID-19 are in a state of rapid change based on information released by regulatory bodies including the CDC and federal and state organizations. These policies and algorithms were followed during the patient's care in the ED.  Final Clinical  Impression(s) / ED Diagnoses Final diagnoses:  Cough  Exposure to COVID-19 virus    Rx / DC Orders ED Discharge Orders    None       Discharge Instructions     We have tested you for COVID 19 at this time. Please self isolate until you receive your results. We will call you if you test positive. You can also log into MyChart and check the results that way.   Use the albuterol inhaler as needed for shortness of breath.   Follow up with your PCP regarding your ED visit today and for recheck of your symptoms.   Return to the ED for any worsening symptoms.        Tanda Rockers, PA-C 02/20/20 1406    Vanetta Mulders, MD 02/21/20 [phone]

## 2020-02-21 LAB — SARS CORONAVIRUS 2 (TAT 6-24 HRS): SARS Coronavirus 2: NEGATIVE

## 2020-02-24 NOTE — H&P (Signed)
Surgical History & Physical  Patient Name: Cheryl Stokes DOB: 10-06-1975  Surgery: Cataract extraction with intraocular lens implant phacoemulsification; Right Eye  Surgeon: Fabio Pierce MD Surgery Date:  03/04/2020 Pre-Op Date:  02/22/2020  HPI: A 44 Yr. old female patient 1. 1. Pt presents for Cataract Evaluation and complaints of blurred vision. This is This is negatively affecting the patient's quality of life. The patient complains of difficulty when seeing street signs, which began many years ago. Both eyes are affected. The episode is gradual. The condition's severity decreased since last visit. The complaint is associated with blurry vision and glare. The patient experiences no eye pain, but reports some dryness OU, and no flashes, floater, shadow, curtain or veil. HPI Completed by Dr. Fabio Pierce  Medical History: Cataracts Fuchs Corneal Dystrophy, Retinal Tear OD-1992 Arthritis Diabetes - DM Type 1 High Blood Pressure  Review of Systems Negative Allergic/Immunologic Negative Cardiovascular Negative Constitutional Negative Ear, Nose, Mouth & Throat Negative Endocrine Negative Eyes Negative Gastrointestinal Negative Genitourinary Negative Hemotologic/Lymphatic Negative Integumentary Negative Musculoskeletal Negative Neurological Negative Psychiatry Negative Respiratory  Social   Former smoker of Cigarettes   Medication Amlodipine, Atorvastatin, Hydroxyzine, Ibuprofen,   Sx/Procedures Retinal Laser OS,  Gastric surgery, Spleen, Gastric Sleeve,   Drug Allergies  Bee stings, Tomatoes,   History & Physical: Heent:  Cataract, Right eye NECK: supple without bruits LUNGS: lungs clear to auscultation CV: regular rate and rhythm Abdomen: soft and non-tender  Impression & Plan: Assessment: 1.  COMBINED FORMS AGE RELATED CATARACT; Right Eye (H25.811) 2.  NUCLEAR SCLEROSIS AGE RELATED; Left Eye (H25.12) 3.  Diabetes Type 1 No retinopathy (E10.9)  Plan:  1.  Cataract accounts for the patient's decreased vision. This visual impairment is not correctable with a tolerable change in glasses or contact lenses. Cataract surgery with an implantation of a new lens should significantly improve the visual and functional status of the patient. Discussed all risks, benefits, alternatives, and potential complications. Discussed the procedures and recovery. Patient desires to have surgery. A-scan ordered and performed today for intra-ocular lens calculations. The surgery will be performed in order to improve vision for driving, reading, and for eye examinations. Recommend phacoemulsification with intra-ocular lens. Recommend Dextenza for post-operative pain and inflammation. Right Eye. Dilates well - shugarcaine by protocol. 2.  Visually significant - will address after OD. 3.  No diabetic retinopathy seen on exam today. Recommend good blood sugar and blood pressure control.

## 2020-02-26 ENCOUNTER — Other Ambulatory Visit: Payer: Self-pay

## 2020-02-26 ENCOUNTER — Encounter (HOSPITAL_COMMUNITY)
Admission: RE | Admit: 2020-02-26 | Discharge: 2020-02-26 | Disposition: A | Discharge status: Home / Self Care | Payer: Medicare HMO | Source: Ambulatory Visit | Attending: Ophthalmology | Admitting: Ophthalmology

## 2020-02-29 DIAGNOSIS — H25811 Combined forms of age-related cataract, right eye: Secondary | ICD-10-CM | POA: Diagnosis not present

## 2020-03-02 ENCOUNTER — Emergency Department (HOSPITAL_COMMUNITY)
Admission: EM | Admit: 2020-03-02 | Discharge: 2020-03-02 | Disposition: A | Discharge status: Home / Self Care | Payer: Medicare HMO | Attending: Emergency Medicine | Admitting: Emergency Medicine

## 2020-03-02 ENCOUNTER — Encounter (HOSPITAL_COMMUNITY): Payer: Self-pay

## 2020-03-02 ENCOUNTER — Other Ambulatory Visit: Payer: Self-pay

## 2020-03-02 ENCOUNTER — Encounter (HOSPITAL_COMMUNITY): Payer: Medicare HMO | Attending: Ophthalmology

## 2020-03-02 ENCOUNTER — Other Ambulatory Visit (HOSPITAL_COMMUNITY): Admission: RE | Admit: 2020-03-02 | Payer: Medicare HMO | Source: Ambulatory Visit

## 2020-03-02 DIAGNOSIS — Z20822 Contact with and (suspected) exposure to covid-19: Secondary | ICD-10-CM | POA: Diagnosis not present

## 2020-03-02 DIAGNOSIS — J069 Acute upper respiratory infection, unspecified: Secondary | ICD-10-CM | POA: Diagnosis not present

## 2020-03-02 DIAGNOSIS — R079 Chest pain, unspecified: Secondary | ICD-10-CM | POA: Diagnosis not present

## 2020-03-02 DIAGNOSIS — R0789 Other chest pain: Secondary | ICD-10-CM | POA: Diagnosis not present

## 2020-03-02 DIAGNOSIS — F17210 Nicotine dependence, cigarettes, uncomplicated: Secondary | ICD-10-CM | POA: Diagnosis not present

## 2020-03-02 DIAGNOSIS — E119 Type 2 diabetes mellitus without complications: Secondary | ICD-10-CM | POA: Insufficient documentation

## 2020-03-02 DIAGNOSIS — I10 Essential (primary) hypertension: Secondary | ICD-10-CM | POA: Diagnosis not present

## 2020-03-02 DIAGNOSIS — Z79899 Other long term (current) drug therapy: Secondary | ICD-10-CM | POA: Insufficient documentation

## 2020-03-02 DIAGNOSIS — B9789 Other viral agents as the cause of diseases classified elsewhere: Secondary | ICD-10-CM | POA: Diagnosis not present

## 2020-03-02 DIAGNOSIS — R69 Illness, unspecified: Secondary | ICD-10-CM | POA: Diagnosis not present

## 2020-03-02 DIAGNOSIS — R059 Cough, unspecified: Secondary | ICD-10-CM | POA: Diagnosis not present

## 2020-03-02 DIAGNOSIS — Z209 Contact with and (suspected) exposure to unspecified communicable disease: Secondary | ICD-10-CM | POA: Diagnosis not present

## 2020-03-02 NOTE — ED Triage Notes (Signed)
Pt to er, per ems pt is here to be tested for covid.  Pt states that she is here because she has a cough, runny nose, body aches and headache. States that she has an appointment for a covid test so she can have eye surgery.  States that she went to a funeral this weekend and come of the people there had covid, states that she is here to see if she has covid.

## 2020-03-02 NOTE — Discharge Instructions (Addendum)
Home to quarantine pending Covid test results. Follow-up with your surgeon's office regarding your respiratory illness and pending Covid test.  If your Covid test is positive, contact your primary care doctor to discuss treatment options.

## 2020-03-02 NOTE — ED Notes (Signed)
Entered room and introduced self to patient. Pt appears to be resting in chair, respirations are even and unlabored with equal chest rise and fall. Call bell within reach. Pt educated on call light use and hourly rounding, verbalized understanding and in agreement at this time. All questions and concerns voiced addressed. Refreshments offered and provided per patient request.  

## 2020-03-02 NOTE — ED Provider Notes (Signed)
[facility] EMERGENCY DEPARTMENT Provider Note   CSN: [id number] Arrival date & time: 03/02/20  1321     History Chief Complaint  Patient presents with  . Shortness of Breath    Cheryl Stokes is a 45 y.o. female.  45 year old female presents with complaint of cough, congestion, feeling sick today.  Patient states that she was scheduled to have a Covid test at this hospital today pending cataract surgery on Friday however when she began to feel ill she called an ambulance to bring her to the emergency room to get tested.  Patient states that she was vaccinated against COVID-19, is scheduled for her booster shot in March.  Patient was exposed to family members at a funeral 4 days ago who have tested positive.  Patient states she does not have explained and is concerned.  No other complaints or concerns.        Past Medical History:  Diagnosis Date  . Diabetes mellitus without complication (HCC)   . Hypertension   . Sleep apnea     There are no problems to display for this patient.   Past Surgical History:  Procedure Laterality Date  . BARIATRIC SURGERY  03/2015   Sleve  . SPLENECTOMY    . TONSILLECTOMY       OB History   No obstetric history on file.     History reviewed. No pertinent family history.  Social History   Tobacco Use  . Smoking status: Current Every Day Smoker    Packs/day: 0.50    Types: Cigarettes  . Smokeless tobacco: Never Used  Vaping Use  . Vaping Use: Never used  Substance Use Topics  . Alcohol use: No  . Drug use: No    Home Medications Prior to Admission medications   Medication Sig Start Date End Date Taking? Authorizing Provider  Acetaminophen (MIDOL PO) Take 2 tablets by mouth every 6 (six) hours as needed (cramps).    [provider]  albuterol (VENTOLIN HFA) 108 (90 Base) MCG/ACT inhaler Inhale 2 puffs into the lungs every 6 (six) hours as needed for wheezing or shortness of breath.    [provider]   chlorhexidine (PERIDEX) 0.12 % solution 15 mLs by Mouth Rinse route 2 (two) times daily. 02/17/20   [provider]  cholecalciferol (VITAMIN D) 25 MCG (1000 UNIT) tablet Take 1,000 Units by mouth daily.    [provider]  EPINEPHrine 0.3 mg/0.3 mL IJ SOAJ injection Inject 0.3 mg into the muscle once as needed (Allergies).  12/09/13   [provider]  ferrous sulfate 325 (65 FE) MG tablet Take 325 mg by mouth daily.    [provider]  ibuprofen (ADVIL,MOTRIN) 800 MG tablet Take 800 mg by mouth every 8 (eight) hours as needed for mild pain.     [provider]  ILEVRO 0.3 % ophthalmic suspension 1 drop See admin instructions. INSTILL ONE DROP EVERY DAY IN THE OPERATIVE EYE ONLY STARTING TWO DAYS BEFORE SURGERY AND CONTINUE AS DIRECTED 12/24/19   [provider]  labetalol (NORMODYNE) 100 MG tablet Take 100 mg by mouth 2 (two) times daily. 02/19/20   [provider]  moxifloxacin (VIGAMOX) 0.5 % ophthalmic solution 1 drop See admin instructions. INSTILL ONE DROP EVERY DAY IN THE OPERATIVE EYE ONLY STARTING TWO DAYS BEFORE SURGERY AND CONTINUE AS DIRECTED 12/24/19   [provider]  Multiple Vitamins-Minerals (ZINC PO) Take 22 mg by mouth daily.    [provider]  ondansetron [facility]  ODT) 4 MG disintegrating tablet 4mg  ODT q4 hours prn nausea/vomit Patient taking differently: Take by mouth every 8 (eight) hours as needed for vomiting or nausea. 02/11/16   Mesner, 02/13/16, MD  Oxycodone HCl 10 MG TABS Take 10 mg by mouth 3 (three) times daily as needed (pain/pinched nerves). 06/21/14   [provider]  prednisoLONE acetate (PRED FORTE) 1 % ophthalmic suspension 1 drop See admin instructions. Instill 1 drop into the operative eye 3 times daily, starting 1 day after surgery 12/24/19   [provider]    Allergies    Bee venom, Tomato, Metformin and related, and Mobic [meloxicam]  Review of Systems   Review of  Systems  Constitutional: Negative for chills and fever.  HENT: Positive for congestion. Negative for sore throat.   Respiratory: Positive for cough. Negative for shortness of breath.   Musculoskeletal: Negative for arthralgias and myalgias.  Skin: Negative for rash and wound.  Allergic/Immunologic: Positive for immunocompromised state.  Neurological: Negative for weakness.  Hematological: Negative for adenopathy.  All other systems reviewed and are negative.   Physical Exam Updated Vital Signs BP (!) 145/79 (BP Location: Right Arm)   Pulse 79   Temp 98 F (36.7 C) (Oral)   Resp 18   Ht 5\' 9"  (1.753 m)   Wt 81.6 kg   SpO2 97%   BMI 26.58 kg/m   Physical Exam Vitals and nursing note reviewed.  Constitutional:      General: She is not in acute distress.    Appearance: She is well-developed and well-nourished. She is not diaphoretic.  HENT:     Head: Normocephalic and atraumatic.  Cardiovascular:     Rate and Rhythm: Normal rate and regular rhythm.  Pulmonary:     Effort: Pulmonary effort is normal.     Breath sounds: Normal breath sounds. No decreased breath sounds.  Musculoskeletal:     Cervical back: Neck supple.  Skin:    General: Skin is warm and dry.     Findings: No erythema or rash.  Neurological:     Mental Status: She is alert and oriented to person, place, and time.  Psychiatric:        Mood and Affect: Mood and affect normal.        Behavior: Behavior normal.     ED Results / Procedures / Treatments   Labs (all labs ordered are listed, but only abnormal results are displayed) Labs Reviewed  SARS CORONAVIRUS 2 (TAT 6-24 HRS)    EKG None  Radiology No results found.  Procedures Procedures   Medications Ordered in ED Medications - No data to display  ED Course  I have reviewed the triage vital signs and the nursing notes.  Pertinent labs & imaging results that were available during my care of the patient were reviewed by me and considered  in my medical decision making (see chart for details).  Clinical Course as of 03/02/20 1612  Wed Mar 02, 2020  628 45 year old female presents as above.  On exam, she is well-appearing, lungs are clear. Advised patient to quarantine pending her Covid test results.  Advised patient to notify her surgeon's office that she is feeling unwell after Covid exposure regardless of her test results. Patient to contact her PCP if her Covid test is positive to discuss treatment options. [LM]    Clinical Course User Index [LM] 2   MDM Rules/Calculators/A&P  Final Clinical Impression(s) / ED Diagnoses Final diagnoses:  Viral URI    Rx / DC Orders ED Discharge Orders    None       Jeannie Fend, PA-C 03/02/20 1612    Cathren Laine, MD 03/02/20 1749

## 2020-03-03 LAB — SARS CORONAVIRUS 2 (TAT 6-24 HRS): SARS Coronavirus 2: NEGATIVE

## 2020-03-03 MED ORDER — TETRACAINE HCL 0.5 % OP SOLN
OPHTHALMIC | Status: AC
  Filled 2020-03-03: qty 4

## 2020-03-03 MED ORDER — PHENYLEPHRINE HCL 2.5 % OP SOLN
OPHTHALMIC | Status: AC
  Filled 2020-03-03: qty 15

## 2020-03-03 MED ORDER — NEOMYCIN-POLYMYXIN-DEXAMETH 3.5-10000-0.1 OP SUSP
OPHTHALMIC | Status: AC
  Filled 2020-03-03: qty 5

## 2020-03-03 MED ORDER — TROPICAMIDE 1 % OP SOLN
OPHTHALMIC | Status: AC
  Filled 2020-03-03: qty 3

## 2020-03-03 MED ORDER — LIDOCAINE HCL 3.5 % OP GEL
OPHTHALMIC | Status: AC
  Filled 2020-03-03: qty 1

## 2020-03-03 MED ORDER — LIDOCAINE HCL (PF) 1 % IJ SOLN
INTRAMUSCULAR | Status: AC
  Filled 2020-03-03: qty 2

## 2020-03-04 ENCOUNTER — Ambulatory Visit (HOSPITAL_COMMUNITY)
Admission: RE | Admit: 2020-03-04 | Discharge: 2020-03-04 | Disposition: A | Discharge status: Home / Self Care | Payer: Medicare HMO | Attending: Ophthalmology | Admitting: Ophthalmology

## 2020-03-04 ENCOUNTER — Ambulatory Visit (HOSPITAL_COMMUNITY): Payer: Medicare HMO | Admitting: Anesthesiology

## 2020-03-04 ENCOUNTER — Other Ambulatory Visit: Payer: Self-pay

## 2020-03-04 ENCOUNTER — Encounter (HOSPITAL_COMMUNITY)
Admission: RE | Disposition: A | Discharge status: Home / Self Care | Payer: Self-pay | Source: Home / Self Care | Attending: Ophthalmology

## 2020-03-04 ENCOUNTER — Encounter (HOSPITAL_COMMUNITY): Payer: Self-pay | Admitting: Ophthalmology

## 2020-03-04 DIAGNOSIS — H2512 Age-related nuclear cataract, left eye: Secondary | ICD-10-CM | POA: Insufficient documentation

## 2020-03-04 DIAGNOSIS — H25811 Combined forms of age-related cataract, right eye: Secondary | ICD-10-CM | POA: Insufficient documentation

## 2020-03-04 DIAGNOSIS — E1036 Type 1 diabetes mellitus with diabetic cataract: Secondary | ICD-10-CM | POA: Diagnosis not present

## 2020-03-04 DIAGNOSIS — Z87891 Personal history of nicotine dependence: Secondary | ICD-10-CM | POA: Diagnosis not present

## 2020-03-04 DIAGNOSIS — Z79899 Other long term (current) drug therapy: Secondary | ICD-10-CM | POA: Insufficient documentation

## 2020-03-04 DIAGNOSIS — E1136 Type 2 diabetes mellitus with diabetic cataract: Secondary | ICD-10-CM | POA: Diagnosis not present

## 2020-03-04 DIAGNOSIS — G4730 Sleep apnea, unspecified: Secondary | ICD-10-CM | POA: Diagnosis not present

## 2020-03-04 HISTORY — PX: CATARACT EXTRACTION W/PHACO: SHX586

## 2020-03-04 LAB — GLUCOSE, CAPILLARY: Glucose-Capillary: 86 mg/dL (ref 70–99)

## 2020-03-04 SURGERY — PHACOEMULSIFICATION, CATARACT, WITH IOL INSERTION
Anesthesia: Monitor Anesthesia Care | Site: Eye | Laterality: Right

## 2020-03-04 MED ORDER — SODIUM HYALURONATE 23 MG/ML IO SOLN
INTRAOCULAR | Status: DC | PRN
  Administered 2020-03-04: 0.6 mL via INTRAOCULAR

## 2020-03-04 MED ORDER — PROVISC 10 MG/ML IO SOLN
INTRAOCULAR | Status: DC | PRN
  Administered 2020-03-04: 0.85 mL via INTRAOCULAR

## 2020-03-04 MED ORDER — TETRACAINE HCL 0.5 % OP SOLN
1.0000 [drp] | OPHTHALMIC | Status: AC | PRN
  Administered 2020-03-04 (×3): 1 [drp] via OPHTHALMIC

## 2020-03-04 MED ORDER — POVIDONE-IODINE 5 % OP SOLN
OPHTHALMIC | Status: DC | PRN
  Administered 2020-03-04: 1 via OPHTHALMIC

## 2020-03-04 MED ORDER — LIDOCAINE HCL (PF) 1 % IJ SOLN
INTRAOCULAR | Status: DC | PRN
  Administered 2020-03-04: 1 mL via OPHTHALMIC

## 2020-03-04 MED ORDER — MIDAZOLAM HCL 2 MG/2ML IJ SOLN
INTRAMUSCULAR | Status: AC
  Filled 2020-03-04: qty 2

## 2020-03-04 MED ORDER — NEOMYCIN-POLYMYXIN-DEXAMETH 3.5-10000-0.1 OP SUSP
OPHTHALMIC | Status: DC | PRN
  Administered 2020-03-04: 1 [drp]

## 2020-03-04 MED ORDER — STERILE WATER FOR IRRIGATION IR SOLN
Status: DC | PRN
  Administered 2020-03-04: 250 mL

## 2020-03-04 MED ORDER — TROPICAMIDE 1 % OP SOLN
1.0000 [drp] | OPHTHALMIC | Status: AC
  Administered 2020-03-04 (×3): 1 [drp] via OPHTHALMIC

## 2020-03-04 MED ORDER — EPINEPHRINE PF 1 MG/ML IJ SOLN
INTRAOCULAR | Status: DC | PRN
  Administered 2020-03-04: 500 mL

## 2020-03-04 MED ORDER — PHENYLEPHRINE HCL 2.5 % OP SOLN
1.0000 [drp] | OPHTHALMIC | Status: AC | PRN
  Administered 2020-03-04 (×3): 1 [drp] via OPHTHALMIC

## 2020-03-04 MED ORDER — EPINEPHRINE PF 1 MG/ML IJ SOLN
INTRAMUSCULAR | Status: AC
  Filled 2020-03-04: qty 2

## 2020-03-04 MED ORDER — SODIUM CHLORIDE (PF) 0.9 % IJ SOLN
INTRAMUSCULAR | Status: AC
  Filled 2020-03-04: qty 10

## 2020-03-04 MED ORDER — SODIUM CHLORIDE 0.9% FLUSH
INTRAVENOUS | Status: DC | PRN
  Administered 2020-03-04: 5 mL via INTRAVENOUS

## 2020-03-04 MED ORDER — BSS IO SOLN
INTRAOCULAR | Status: DC | PRN
  Administered 2020-03-04: 15 mL via INTRAOCULAR

## 2020-03-04 MED ORDER — MIDAZOLAM HCL 2 MG/2ML IJ SOLN
INTRAMUSCULAR | Status: DC | PRN
  Administered 2020-03-04: 1 mg via INTRAVENOUS

## 2020-03-04 MED ORDER — LIDOCAINE HCL 3.5 % OP GEL
1.0000 "application " | Freq: Once | OPHTHALMIC | Status: AC
  Administered 2020-03-04: 1 via OPHTHALMIC

## 2020-03-04 SURGICAL SUPPLY — 13 items
CLOTH BEACON ORANGE TIMEOUT ST (SAFETY) ×1 IMPLANT
EYE SHIELD UNIVERSAL CLEAR (GAUZE/BANDAGES/DRESSINGS) ×1 IMPLANT
GLOVE SURG UNDER POLY LF SZ6.5 (GLOVE) ×1 IMPLANT
GLOVE SURG UNDER POLY LF SZ7 (GLOVE) ×1 IMPLANT
NDL HYPO 18GX1.5 BLUNT FILL (NEEDLE) IMPLANT
NEEDLE HYPO 18GX1.5 BLUNT FILL (NEEDLE) ×2 IMPLANT
PAD ARMBOARD 7.5X6 YLW CONV (MISCELLANEOUS) ×1 IMPLANT
SYR TB 1ML LL NO SAFETY (SYRINGE) ×1 IMPLANT
TAPE SURG TRANSPORE 1 IN (GAUZE/BANDAGES/DRESSINGS) IMPLANT
TAPE SURGICAL TRANSPORE 1 IN (GAUZE/BANDAGES/DRESSINGS) ×2
TECNIS 1-PIECE INTRAOCULAR LENS (Intraocular Lens) ×1 IMPLANT
VISCOELASTIC ADDITIONAL (OPHTHALMIC RELATED) IMPLANT
WATER STERILE IRR 250ML POUR (IV SOLUTION) ×1 IMPLANT

## 2020-03-04 NOTE — Interval H&P Note (Signed)
History and Physical Interval Note:  03/04/2020 7:51 AM  Cheryl Stokes  has presented today for surgery, with the diagnosis of Nuclear sclerotic cataract - Right eye.  The various methods of treatment have been discussed with the patient and family. After consideration of risks, benefits and other options for treatment, the patient has consented to  Procedure(s) with comments: CATARACT EXTRACTION PHACO AND INTRAOCULAR LENS PLACEMENT (IOC) (Right) - right as a surgical intervention.  The patient's history has been reviewed, patient examined, no change in status, stable for surgery.  I have reviewed the patient's chart and labs.  Questions were answered to the patient's satisfaction.     Fabio Pierce

## 2020-03-04 NOTE — Transfer of Care (Signed)
Immediate Anesthesia Transfer of Care Note  Patient: Cheryl Stokes  Procedure(s) Performed: CATARACT EXTRACTION PHACO AND INTRAOCULAR LENS PLACEMENT (IOC) (Right Eye)  Patient Location: Short Stay  Anesthesia Type:MAC  Level of Consciousness: awake, alert  and oriented  Airway & Oxygen Therapy: Patient Spontanous Breathing  Post-op Assessment: Report given to RN and Post -op Vital signs reviewed and stable  Post vital signs: Reviewed and stable  Last Vitals:  Vitals Value Taken Time  BP    Temp    Pulse    Resp    SpO2      Last Pain:  Vitals:   03/04/20 0735  TempSrc: Oral  PainSc: 0-No pain         Complications: No complications documented.

## 2020-03-04 NOTE — Anesthesia Preprocedure Evaluation (Signed)
Anesthesia Evaluation  Patient identified by MRN, date of birth, ID band Patient awake    Reviewed: Allergy & Precautions, NPO status , Patient's Chart, lab work & pertinent test results, reviewed documented beta blocker date and time   History of Anesthesia Complications Negative for: history of anesthetic complications  Airway Mallampati: II  TM Distance: >3 FB Neck ROM: Full    Dental  (+) Dental Advisory Given, Partial Upper, Partial Lower   Pulmonary sleep apnea , Current Smoker and Patient abstained from smoking.,    Pulmonary exam normal breath sounds clear to auscultation       Cardiovascular Exercise Tolerance: Good hypertension, Pt. on medications and Pt. on home beta blockers Normal cardiovascular exam Rhythm:Regular Rate:Normal     Neuro/Psych negative neurological ROS  negative psych ROS   GI/Hepatic negative GI ROS, Neg liver ROS,   Endo/Other  diabetes, Well Controlled, Type 2  Renal/GU negative Renal ROS     Musculoskeletal negative musculoskeletal ROS (+)   Abdominal   Peds  Hematology   Anesthesia Other Findings   Reproductive/Obstetrics negative OB ROS                             Anesthesia Physical Anesthesia Plan  ASA: II  Anesthesia Plan: MAC   Post-op Pain Management:    Induction:   PONV Risk Score and Plan:   Airway Management Planned: Nasal Cannula and Natural Airway  Additional Equipment:   Intra-op Plan:   Post-operative Plan:   Informed Consent: I have reviewed the patients History and Physical, chart, labs and discussed the procedure including the risks, benefits and alternatives for the proposed anesthesia with the patient or authorized representative who has indicated his/her understanding and acceptance.       Plan Discussed with: CRNA and Surgeon  Anesthesia Plan Comments:         Anesthesia Quick Evaluation

## 2020-03-04 NOTE — Anesthesia Postprocedure Evaluation (Signed)
Anesthesia Post Note  Patient: Cheryl Stokes  Procedure(s) Performed: CATARACT EXTRACTION PHACO AND INTRAOCULAR LENS PLACEMENT (IOC) (Right Eye)  Patient location during evaluation: Phase II Anesthesia Type: MAC Level of consciousness: awake and alert and oriented Pain management: pain level controlled Vital Signs Assessment: post-procedure vital signs reviewed and stable Respiratory status: spontaneous breathing, nonlabored ventilation and respiratory function stable Cardiovascular status: blood pressure returned to baseline and stable Postop Assessment: no apparent nausea or vomiting Anesthetic complications: no   No complications documented.   Last Vitals:  Vitals:   03/04/20 0740 03/04/20 0821  BP: (!) 170/73 (!) 163/83  Pulse:  74  Resp:    Temp:    SpO2:  100%    Last Pain:  Vitals:   03/04/20 0821  TempSrc: Oral  PainSc:                  Orlie Dakin

## 2020-03-04 NOTE — Op Note (Signed)
Date of procedure: 03/04/20  Pre-operative diagnosis:  Visually significant combined form age-related cataract, Right Eye (H25.811)  Post-operative diagnosis:  Visually significant combined form age-related cataract, Right Eye (H25.811)  Procedure: Removal of cataract via phacoemulsification and insertion of intra-ocular lens Wynetta Emery and [employer] [id number]  +23.5D into the capsular bag of the Right Eye  Attending surgeon: Gerda Diss. Wrzosek, MD, MA  Anesthesia: MAC, Topical Akten  Complications: None  Estimated Blood Loss: <13m (minimal)  Specimens: None  Implants: As above  Indications:  Visually significant age-related cataract, Right Eye  Procedure:  The patient was seen and identified in the pre-operative area. The operative eye was identified and dilated.  The operative eye was marked.  Topical anesthesia was administered to the operative eye.     The patient was then to the operative suite and placed in the supine position.  A timeout was performed confirming the patient, procedure to be performed, and all other relevant information.   The patient's face was prepped and draped in the usual fashion for intra-ocular surgery.  A lid speculum was placed into the operative eye and the surgical microscope moved into place and focused.  A superotemporal paracentesis was created using a 20 gauge paracentesis blade.  Shugarcaine was injected into the anterior chamber.  Viscoelastic was injected into the anterior chamber.  A temporal clear-corneal main wound incision was created using a 2.460mmicrokeratome.  A continuous curvilinear capsulorrhexis was initiated using an irrigating cystitome and completed using capsulorrhexis forceps.  Hydrodissection and hydrodeliniation were performed.  Viscoelastic was injected into the anterior chamber.  A phacoemulsification handpiece and a chopper as a second instrument were used to remove the nucleus and epinucleus. The irrigation/aspiration handpiece was  used to remove any remaining cortical material.   The capsular bag was reinflated with viscoelastic, checked, and found to be intact.  The intraocular lens was inserted into the capsular bag.  The irrigation/aspiration handpiece was used to remove any remaining viscoelastic.  The clear corneal wound and paracentesis wounds were then hydrated and checked with Weck-Cels to be watertight.  The lid-speculum was removed.  The drape was removed.  The patient's face was cleaned with a wet and dry 4x4.   Maxitrol was instilled in the eye. A clear shield was taped over the eye. The patient was taken to the post-operative care unit in good condition, having tolerated the procedure well.  Post-Op Instructions: [facility] and will receive all other orders and instructions.

## 2020-03-04 NOTE — Anesthesia Procedure Notes (Signed)
Procedure Name: MAC Date/Time: 03/04/2020 8:04 AM Performed by: Orlie Dakin, CRNA Pre-anesthesia Checklist: Patient identified, Emergency Drugs available, Suction available and Patient being monitored Patient Re-evaluated:Patient Re-evaluated prior to induction Oxygen Delivery Method: Nasal cannula Placement Confirmation: positive ETCO2

## 2020-03-04 NOTE — Discharge Instructions (Addendum)
Please discharge patient when stable, will follow up today with Dr. Wrzosek at the [facility] [place] office immediately following discharge.  Leave shield in place until visit.  All paperwork with discharge instructions will be given at the office.  [facility] Address:  [address]  [place], [place] [address]   PATIENT INSTRUCTIONS POST-ANESTHESIA  IMMEDIATELY FOLLOWING SURGERY:  Do not drive or operate machinery for the first twenty four hours after surgery.  Do not make any important decisions for twenty four hours after surgery or while taking narcotic pain medications or sedatives.  If you develop intractable nausea and vomiting or a severe headache please notify your doctor immediately.  FOLLOW-UP:  Please make an appointment with your surgeon as instructed. You do not need to follow up with anesthesia unless specifically instructed to do so.  WOUND CARE INSTRUCTIONS (if applicable):  Keep a dry clean dressing on the anesthesia/puncture wound site if there is drainage.  Once the wound has quit draining you may leave it open to air.  Generally you should leave the bandage intact for twenty four hours unless there is drainage.  If the epidural site drains for more than 36-48 hours please call the anesthesia department.  QUESTIONS?:  Please feel free to call your physician or the hospital operator if you have any questions, and they will be happy to assist you.       

## 2020-03-07 ENCOUNTER — Encounter (HOSPITAL_COMMUNITY): Payer: Self-pay | Admitting: Ophthalmology

## 2020-03-07 DIAGNOSIS — E782 Mixed hyperlipidemia: Secondary | ICD-10-CM | POA: Diagnosis not present

## 2020-03-07 DIAGNOSIS — I10 Essential (primary) hypertension: Secondary | ICD-10-CM | POA: Diagnosis not present

## 2020-03-07 DIAGNOSIS — Z789 Other specified health status: Secondary | ICD-10-CM | POA: Diagnosis not present

## 2020-03-14 ENCOUNTER — Encounter (HOSPITAL_COMMUNITY)
Admission: RE | Admit: 2020-03-14 | Discharge: 2020-03-14 | Disposition: A | Discharge status: Home / Self Care | Payer: Medicare HMO | Source: Ambulatory Visit | Attending: Ophthalmology | Admitting: Ophthalmology

## 2020-03-14 DIAGNOSIS — F411 Generalized anxiety disorder: Secondary | ICD-10-CM | POA: Diagnosis not present

## 2020-03-14 DIAGNOSIS — F3132 Bipolar disorder, current episode depressed, moderate: Secondary | ICD-10-CM | POA: Diagnosis not present

## 2020-03-14 DIAGNOSIS — R69 Illness, unspecified: Secondary | ICD-10-CM | POA: Diagnosis not present

## 2020-03-14 DIAGNOSIS — H2512 Age-related nuclear cataract, left eye: Secondary | ICD-10-CM | POA: Diagnosis not present

## 2020-03-14 NOTE — H&P (Signed)
Surgical History & Physical  Patient Name: Cheryl Stokes DOB: October 05, 1975  Surgery: Cataract extraction with intraocular lens implant phacoemulsification; Left Eye  Surgeon: Fabio Pierce MD Surgery Date:  03/18/2020 Pre-Op Date:  03/10/2020  HPI: A 19 Yr. old female patient PO-OD/Pre op OS The patient is returning after cataract surgery. Both eyes are affected. Status post cataract surgery, which began 1 week ago: Since the last visit, the affected area is doing well. The patient's vision is improved and stable. Pt d/c Ilevro and Moxi prior to sx and continued Pred TID. Pt was unaware she was to continue all drops. Denies any increase in floaters. The patient complains of difficulty when seeing street signs, which began many years ago. The left eye is affected. The episode is gradual. The condition's severity decreased since last visit. The complaint is associated with blurry vision and glare. This is negatively affecting the patient's quality of life. HPI Completed by Dr. Fabio Pierce  Medical History: Cataracts Fuchs Corneal Dystrophy, Retinal Tear OD-1992 Arthritis Diabetes - DM Type 1 High Blood Pressure  Review of Systems Negative Allergic/Immunologic Negative Cardiovascular Negative Constitutional Negative Ear, Nose, Mouth & Throat Negative Endocrine Negative Eyes Negative Gastrointestinal Negative Genitourinary Negative Hemotologic/Lymphatic Negative Integumentary Negative Musculoskeletal Negative Neurological Negative Psychiatry Negative Respiratory  Social   Former smoker of Cigarettes   Medication Ilevro, Moxifloxacin, Prednisolone acetate 1%,  Amlodipine, Atorvastatin, Hydroxyzine, Ibuprofen,   Sx/Procedures Retinal Laser OS, Phaco c IOL OD,  Gastric surgery, Spleen, Gastric Sleeve,   Drug Allergies  Bee stings, Tomatoes,   History & Physical: Heent:  Cataract, Left eye NECK: supple without bruits LUNGS: lungs clear to auscultation CV: regular rate  and rhythm Abdomen: soft and non-tender  Impression & Plan: Assessment: 1.  CATARACT EXTRACTION STATUS; Right Eye (Z98.41) 2.  INTRAOCULAR LENS IOL (Z96.1) 3.  NUCLEAR SCLEROSIS AGE RELATED; Left Eye (H25.12)  Plan: 1.  1 week after cataract surgery. Doing well with improved vision and normal eye pressure. Call with any problems or concerns. Stop Vigamox. Continue Ilevro 1 drop 1x/day for 3 more weeks. Continue Pred Acetate 1 drop 2x/day for 3 more weeks. 2.  Doing well since surgery Continue Post-op medications 3.  Cataract accounts for the patient's decreased vision. This visual impairment is not correctable with a tolerable change in glasses or contact lenses. Cataract surgery with an implantation of a new lens should significantly improve the visual and functional status of the patient. Discussed all risks, benefits, alternatives, and potential complications. Discussed the procedures and recovery. Patient desires to have surgery. A-scan ordered and performed today for intra-ocular lens calculations. The surgery will be performed in order to improve vision for driving, reading, and for eye examinations. Recommend phacoemulsification with intra-ocular lens. Recommend Dextenza for post-operative pain and inflammation. Left Eye. Surgery required to correct imbalance of vision. Dilates well - shugarcaine by protocol.

## 2020-03-16 ENCOUNTER — Other Ambulatory Visit: Payer: Self-pay

## 2020-03-16 ENCOUNTER — Other Ambulatory Visit (HOSPITAL_COMMUNITY): Admission: RE | Admit: 2020-03-16 | Payer: Medicare HMO | Source: Ambulatory Visit

## 2020-03-16 ENCOUNTER — Other Ambulatory Visit (HOSPITAL_COMMUNITY): Payer: Medicare HMO

## 2020-03-16 ENCOUNTER — Other Ambulatory Visit (HOSPITAL_COMMUNITY)
Admission: RE | Admit: 2020-03-16 | Discharge: 2020-03-16 | Disposition: A | Discharge status: Home / Self Care | Payer: Medicare HMO | Source: Ambulatory Visit | Attending: Ophthalmology | Admitting: Ophthalmology

## 2020-03-16 ENCOUNTER — Encounter (HOSPITAL_COMMUNITY)
Admission: RE | Admit: 2020-03-16 | Discharge: 2020-03-16 | Disposition: A | Discharge status: Home / Self Care | Payer: Medicare HMO | Source: Ambulatory Visit | Attending: *Deleted | Admitting: *Deleted

## 2020-03-16 DIAGNOSIS — Z20822 Contact with and (suspected) exposure to covid-19: Secondary | ICD-10-CM | POA: Insufficient documentation

## 2020-03-16 DIAGNOSIS — Z01812 Encounter for preprocedural laboratory examination: Secondary | ICD-10-CM | POA: Diagnosis not present

## 2020-03-16 LAB — PREGNANCY, URINE: Preg Test, Ur: NEGATIVE

## 2020-03-17 LAB — SARS CORONAVIRUS 2 (TAT 6-24 HRS): SARS Coronavirus 2: NEGATIVE

## 2020-03-18 ENCOUNTER — Ambulatory Visit (HOSPITAL_COMMUNITY)
Admission: RE | Admit: 2020-03-18 | Discharge: 2020-03-18 | Disposition: A | Discharge status: Home / Self Care | Payer: Medicare HMO | Attending: Ophthalmology | Admitting: Ophthalmology

## 2020-03-18 ENCOUNTER — Ambulatory Visit (HOSPITAL_COMMUNITY): Payer: Medicare HMO | Admitting: Anesthesiology

## 2020-03-18 ENCOUNTER — Encounter (HOSPITAL_COMMUNITY)
Admission: RE | Disposition: A | Discharge status: Home / Self Care | Payer: Self-pay | Source: Home / Self Care | Attending: Ophthalmology

## 2020-03-18 ENCOUNTER — Encounter (HOSPITAL_COMMUNITY): Payer: Self-pay | Admitting: Ophthalmology

## 2020-03-18 DIAGNOSIS — Z91018 Allergy to other foods: Secondary | ICD-10-CM | POA: Insufficient documentation

## 2020-03-18 DIAGNOSIS — Z91030 Bee allergy status: Secondary | ICD-10-CM | POA: Insufficient documentation

## 2020-03-18 DIAGNOSIS — Z9841 Cataract extraction status, right eye: Secondary | ICD-10-CM | POA: Insufficient documentation

## 2020-03-18 DIAGNOSIS — Z961 Presence of intraocular lens: Secondary | ICD-10-CM | POA: Insufficient documentation

## 2020-03-18 DIAGNOSIS — Z87891 Personal history of nicotine dependence: Secondary | ICD-10-CM | POA: Insufficient documentation

## 2020-03-18 DIAGNOSIS — H2512 Age-related nuclear cataract, left eye: Secondary | ICD-10-CM | POA: Insufficient documentation

## 2020-03-18 DIAGNOSIS — E1036 Type 1 diabetes mellitus with diabetic cataract: Secondary | ICD-10-CM | POA: Insufficient documentation

## 2020-03-18 DIAGNOSIS — E119 Type 2 diabetes mellitus without complications: Secondary | ICD-10-CM | POA: Diagnosis not present

## 2020-03-18 HISTORY — PX: CATARACT EXTRACTION W/PHACO: SHX586

## 2020-03-18 LAB — GLUCOSE, CAPILLARY: Glucose-Capillary: 83 mg/dL (ref 70–99)

## 2020-03-18 SURGERY — PHACOEMULSIFICATION, CATARACT, WITH IOL INSERTION
Anesthesia: Monitor Anesthesia Care | Site: Eye | Laterality: Left

## 2020-03-18 MED ORDER — PHENYLEPHRINE HCL 2.5 % OP SOLN
1.0000 [drp] | OPHTHALMIC | Status: AC | PRN
  Administered 2020-03-18 (×3): 1 [drp] via OPHTHALMIC

## 2020-03-18 MED ORDER — SODIUM HYALURONATE 23 MG/ML IO SOLN
INTRAOCULAR | Status: DC | PRN
  Administered 2020-03-18: 0.6 mL via INTRAOCULAR

## 2020-03-18 MED ORDER — POVIDONE-IODINE 5 % OP SOLN
OPHTHALMIC | Status: DC | PRN
  Administered 2020-03-18: 1 via OPHTHALMIC

## 2020-03-18 MED ORDER — LIDOCAINE HCL (PF) 1 % IJ SOLN
INTRAOCULAR | Status: DC | PRN
  Administered 2020-03-18: 1 mL via OPHTHALMIC

## 2020-03-18 MED ORDER — STERILE WATER FOR IRRIGATION IR SOLN
Status: DC | PRN
  Administered 2020-03-18: 250 mL

## 2020-03-18 MED ORDER — BSS IO SOLN
INTRAOCULAR | Status: DC | PRN
  Administered 2020-03-18: 15 mL via INTRAOCULAR

## 2020-03-18 MED ORDER — MIDAZOLAM HCL 2 MG/2ML IJ SOLN
INTRAMUSCULAR | Status: DC | PRN
  Administered 2020-03-18 (×2): 1 mg via INTRAVENOUS

## 2020-03-18 MED ORDER — SODIUM CHLORIDE 0.9% FLUSH
INTRAVENOUS | Status: DC | PRN
  Administered 2020-03-18 (×2): 5 mL via INTRAVENOUS

## 2020-03-18 MED ORDER — NEOMYCIN-POLYMYXIN-DEXAMETH 3.5-10000-0.1 OP SUSP
OPHTHALMIC | Status: DC | PRN
  Administered 2020-03-18: 1 [drp] via OPHTHALMIC

## 2020-03-18 MED ORDER — EPINEPHRINE PF 1 MG/ML IJ SOLN: INTRAOCULAR | Status: DC | PRN

## 2020-03-18 MED ORDER — TETRACAINE HCL 0.5 % OP SOLN
1.0000 [drp] | OPHTHALMIC | Status: AC | PRN
  Administered 2020-03-18 (×3): 1 [drp] via OPHTHALMIC

## 2020-03-18 MED ORDER — MIDAZOLAM HCL 2 MG/2ML IJ SOLN
INTRAMUSCULAR | Status: AC
  Filled 2020-03-18: qty 2

## 2020-03-18 MED ORDER — EPINEPHRINE PF 1 MG/ML IJ SOLN
INTRAMUSCULAR | Status: AC
  Filled 2020-03-18: qty 2

## 2020-03-18 MED ORDER — TROPICAMIDE 1 % OP SOLN: 1.0000 [drp] | OPHTHALMIC | Status: DC

## 2020-03-18 MED ORDER — TROPICAMIDE 1 % OP SOLN
1.0000 [drp] | OPHTHALMIC | Status: AC
  Administered 2020-03-18 (×3): 1 [drp] via OPHTHALMIC

## 2020-03-18 MED ORDER — LIDOCAINE HCL 3.5 % OP GEL
1.0000 "application " | Freq: Once | OPHTHALMIC | Status: AC
  Administered 2020-03-18: 1 via OPHTHALMIC

## 2020-03-18 MED ORDER — PROVISC 10 MG/ML IO SOLN
INTRAOCULAR | Status: DC | PRN
  Administered 2020-03-18: 0.85 mL via INTRAOCULAR

## 2020-03-18 SURGICAL SUPPLY — 13 items
CLOTH BEACON ORANGE TIMEOUT ST (SAFETY) ×1 IMPLANT
EYE SHIELD UNIVERSAL CLEAR (GAUZE/BANDAGES/DRESSINGS) ×1 IMPLANT
GLOVE SURG UNDER POLY LF SZ6.5 (GLOVE) ×1 IMPLANT
GLOVE SURG UNDER POLY LF SZ7 (GLOVE) ×1 IMPLANT
NDL HYPO 18GX1.5 BLUNT FILL (NEEDLE) IMPLANT
NEEDLE HYPO 18GX1.5 BLUNT FILL (NEEDLE) ×2 IMPLANT
PAD ARMBOARD 7.5X6 YLW CONV (MISCELLANEOUS) ×1 IMPLANT
SYR TB 1ML LL NO SAFETY (SYRINGE) ×1 IMPLANT
TAPE SURG TRANSPORE 1 IN (GAUZE/BANDAGES/DRESSINGS) IMPLANT
TAPE SURGICAL TRANSPORE 1 IN (GAUZE/BANDAGES/DRESSINGS) ×2
TECNIS IOL (Intraocular Lens) ×1 IMPLANT
VISCOELASTIC ADDITIONAL (OPHTHALMIC RELATED) IMPLANT
WATER STERILE IRR 250ML POUR (IV SOLUTION) ×1 IMPLANT

## 2020-03-18 NOTE — Interval H&P Note (Signed)
History and Physical Interval Note:  03/18/2020 11:26 AM  Cheryl Stokes  has presented today for surgery, with the diagnosis of Nuclear sclerotic cataract - Left eye.  The various methods of treatment have been discussed with the patient and family. After consideration of risks, benefits and other options for treatment, the patient has consented to  Procedure(s) with comments: CATARACT EXTRACTION PHACO AND INTRAOCULAR LENS PLACEMENT (IOC) (Left) - left as a surgical intervention.  The patient's history has been reviewed, patient examined, no change in status, stable for surgery.  I have reviewed the patient's chart and labs.  Questions were answered to the patient's satisfaction.     Fabio Pierce

## 2020-03-18 NOTE — Op Note (Signed)
Date of procedure: 03/18/20  Pre-operative diagnosis: Visually significant age-related nuclear cataract, Left Eye (H25.12)  Post-operative diagnosis: Visually significant age-related nuclear cataract, Left Eye  Procedure: Removal of cataract via phacoemulsification and insertion of intra-ocular lens Wynetta Emery and [place]  +23.5D into the capsular bag of the Left Eye  Attending surgeon: Gerda Diss. Kathyann Spaugh, MD, MA  Anesthesia: MAC, Topical Akten  Complications: None  Estimated Blood Loss: <86m (minimal)  Specimens: None  Implants: As above  Indications:  Visually significant age-related cataract, Left Eye  Procedure:  The patient was seen and identified in the pre-operative area. The operative eye was identified and dilated.  The operative eye was marked.  Topical anesthesia was administered to the operative eye.     The patient was then to the operative suite and placed in the supine position.  A timeout was performed confirming the patient, procedure to be performed, and all other relevant information.   The patient's face was prepped and draped in the usual fashion for intra-ocular surgery.  A lid speculum was placed into the operative eye and the surgical microscope moved into place and focused.  An inferotemporal paracentesis was created using a 20 gauge paracentesis blade.  Shugarcaine was injected into the anterior chamber.  Viscoelastic was injected into the anterior chamber.  A temporal clear-corneal main wound incision was created using a 2.454mmicrokeratome.  A continuous curvilinear capsulorrhexis was initiated using an irrigating cystitome and completed using capsulorrhexis forceps.  Hydrodissection and hydrodeliniation were performed.  Viscoelastic was injected into the anterior chamber.  A phacoemulsification handpiece and a chopper as a second instrument were used to remove the nucleus and epinucleus. The irrigation/aspiration handpiece was used to remove any remaining  cortical material.   The capsular bag was reinflated with viscoelastic, checked, and found to be intact.  The intraocular lens was inserted into the capsular bag.  The irrigation/aspiration handpiece was used to remove any remaining viscoelastic.  The clear corneal wound and paracentesis wounds were then hydrated and checked with Weck-Cels to be watertight.  The lid-speculum and drape was removed, and the patient's face was cleaned with a wet and dry 4x4.  Maxitrol was instilled in the eye before a clear shield was taped over the eye. The patient was taken to the post-operative care unit in good condition, having tolerated the procedure well.  Post-Op Instructions: [facility] and will receive all other orders and instructions.

## 2020-03-18 NOTE — Anesthesia Procedure Notes (Signed)
Procedure Name: MAC Date/Time: 03/18/2020 11:33 AM Performed by: Orlie Dakin, CRNA Pre-anesthesia Checklist: Patient identified, Emergency Drugs available, Patient being monitored and Suction available Patient Re-evaluated:Patient Re-evaluated prior to induction Oxygen Delivery Method: Nasal cannula Placement Confirmation: positive ETCO2

## 2020-03-18 NOTE — Anesthesia Postprocedure Evaluation (Signed)
Anesthesia Post Note  Patient: Cheryl Stokes  Procedure(s) Performed: CATARACT EXTRACTION PHACO AND INTRAOCULAR LENS PLACEMENT (IOC) (Left Eye)  Patient location during evaluation: Phase II Anesthesia Type: MAC Level of consciousness: awake and alert and oriented Pain management: pain level controlled Vital Signs Assessment: post-procedure vital signs reviewed and stable Respiratory status: spontaneous breathing, nonlabored ventilation and respiratory function stable Cardiovascular status: blood pressure returned to baseline and stable Postop Assessment: no apparent nausea or vomiting Anesthetic complications: no   No complications documented.   Last Vitals:  Vitals:   03/18/20 1016 03/18/20 1030  BP: (!) 143/72 132/73  Pulse:  68  Resp: 13 12  Temp: 36.4 C   SpO2: 100% 100%    Last Pain:  Vitals:   03/18/20 1016  TempSrc: Oral  PainSc: 0-No pain                 Orlie Dakin

## 2020-03-18 NOTE — Transfer of Care (Signed)
Immediate Anesthesia Transfer of Care Note  Patient: Cheryl Stokes  Procedure(s) Performed: CATARACT EXTRACTION PHACO AND INTRAOCULAR LENS PLACEMENT (IOC) (Left Eye)  Patient Location: Short Stay  Anesthesia Type:MAC  Level of Consciousness: awake, alert  and oriented  Airway & Oxygen Therapy: Patient Spontanous Breathing  Post-op Assessment: Report given to RN and Post -op Vital signs reviewed and stable  Post vital signs: Reviewed and stable  Last Vitals:  Vitals Value Taken Time  BP    Temp    Pulse    Resp    SpO2      Last Pain:  Vitals:   03/18/20 1016  TempSrc: Oral  PainSc: 0-No pain      Patients Stated Pain Goal: 8 (15/72/62 0355)  Complications: No complications documented.

## 2020-03-18 NOTE — Anesthesia Preprocedure Evaluation (Addendum)
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 on medications and Pt. on home beta blockers Normal cardiovascular exam Rhythm:Regular Rate:Normal     Neuro/Psych    GI/Hepatic Neg liver ROS, H/o Gastric bypass   Endo/Other  diabetes, Well Controlled, Type 2  Renal/GU negative Renal ROS  negative genitourinary   Musculoskeletal negative musculoskeletal ROS (+)   Abdominal   Peds  Hematology negative hematology ROS (+)   Anesthesia Other Findings   Reproductive/Obstetrics negative OB ROS                           Anesthesia Physical Anesthesia Plan  ASA: II  Anesthesia Plan: MAC   Post-op Pain Management:    Induction:   PONV Risk Score and Plan:   Airway Management Planned: Nasal Cannula and Natural Airway  Additional Equipment:   Intra-op Plan:   Post-operative Plan:   Informed Consent: I have reviewed the patients History and Physical, chart, labs and discussed the procedure including the risks, benefits and alternatives for the proposed anesthesia with the patient or authorized representative who has indicated his/her understanding and acceptance.     Dental advisory given  Plan Discussed with: CRNA and Surgeon  Anesthesia Plan Comments:                                           Anesthesia Evaluation  Patient identified by MRN, date of birth, ID band Patient awake    Reviewed: Allergy & Precautions, NPO status , Patient's Chart, lab work & pertinent test results, reviewed documented beta blocker date and time   History of Anesthesia Complications Negative for: history of anesthetic complications  Airway Mallampati: II  TM Distance: >3 FB Neck ROM: Full    Dental  (+) Dental Advisory Given, Partial Upper, Partial Lower   Pulmonary sleep apnea , Current Smoker and Patient abstained from smoking.,    Pulmonary exam normal breath sounds clear to auscultation       Cardiovascular Exercise Tolerance: Good hypertension, Pt. on medications and Pt. on home beta blockers Normal cardiovascular exam Rhythm:Regular Rate:Normal     Neuro/Psych negative neurological ROS  negative psych ROS   GI/Hepatic negative GI ROS, Neg liver ROS,   Endo/Other  diabetes, Well Controlled, Type 2  Renal/GU negative Renal ROS     Musculoskeletal negative musculoskeletal ROS (+)   Abdominal   Peds  Hematology   Anesthesia Other Findings   Reproductive/Obstetrics negative OB ROS                             Anesthesia Physical Anesthesia Plan  ASA: II  Anesthesia Plan: MAC   Post-op Pain Management:    Induction:  PONV Risk Score and Plan:   Airway Management Planned: Nasal Cannula and Natural Airway  Additional Equipment:   Intra-op Plan:   Post-operative Plan:   Informed Consent: I have reviewed the patients History and Physical, chart, labs and discussed the procedure including the risks, benefits and alternatives for the proposed anesthesia with the patient or authorized representative who has indicated his/her understanding and acceptance.       Plan Discussed with: CRNA and Surgeon  Anesthesia Plan Comments:         Anesthesia Quick Evaluation  Anesthesia Quick  Evaluation

## 2020-03-18 NOTE — Progress Notes (Signed)
Patient in chair waiting for husband to arrive to pick her up.

## 2020-03-18 NOTE — Discharge Instructions (Addendum)
Please discharge patient when stable, will follow up today with Dr. Wrzosek at the [facility] [place] office immediately following discharge.  Leave shield in place until visit.  All paperwork with discharge instructions will be given at the office.  [facility] Address:  [address]  [place], [place] [address]             Monitored Anesthesia Care, Care After This sheet gives you information about how to care for yourself after your procedure. Your health care provider may also give you more specific instructions. If you have problems or questions, contact your health care provider. What can I expect after the procedure? After the procedure, it is common to have:  Tiredness.  Forgetfulness about what happened after the procedure.  Impaired judgment for important decisions.  Nausea or vomiting.  Some difficulty with balance. Follow these instructions at home: For the time period you were told by your health care provider:  Rest as needed.  Do not participate in activities where you could fall or become injured.  Do not drive or use machinery.  Do not drink alcohol.  Do not take sleeping pills or medicines that cause drowsiness.  Do not make important decisions or sign legal documents.  Do not take care of children on your own.      Eating and drinking  Follow the diet that is recommended by your health care provider.  Drink enough fluid to keep your urine pale yellow.  If you vomit: ? Drink water, juice, or soup when you can drink without vomiting. ? Make sure you have little or no nausea before eating solid foods. General instructions  Have a responsible adult stay with you for the time you are told. It is important to have someone help care for you until you are awake and alert.  Take over-the-counter and prescription medicines only as told by your health care provider.  If you have sleep apnea, surgery and certain  medicines can increase your risk for breathing problems. Follow instructions from your health care provider about wearing your sleep device: ? Anytime you are sleeping, including during daytime naps. ? While taking prescription pain medicines, sleeping medicines, or medicines that make you drowsy.  Avoid smoking.  Keep all follow-up visits as told by your health care provider. This is important. Contact a health care provider if:  You keep feeling nauseous or you keep vomiting.  You feel light-headed.  You are still sleepy or having trouble with balance after 24 hours.  You develop a rash.  You have a fever.  You have redness or swelling around the IV site. Get help right away if:  You have trouble breathing.  You have new-onset confusion at home. Summary  For several hours after your procedure, you may feel tired. You may also be forgetful and have poor judgment.  Have a responsible adult stay with you for the time you are told. It is important to have someone help care for you until you are awake and alert.  Rest as told. Do not drive or operate machinery. Do not drink alcohol or take sleeping pills.  Get help right away if you have trouble breathing, or if you suddenly become confused. This information is not intended to replace advice given to you by your health care provider. Make sure you discuss any questions you have with your health care provider. Document Revised: 09/17/2019 Document Reviewed: 12/04/2018 Elsevier Patient Education  2021 [employer]  

## 2020-03-21 ENCOUNTER — Encounter (HOSPITAL_COMMUNITY): Payer: Self-pay | Admitting: Ophthalmology

## 2020-03-21 DIAGNOSIS — Z113 Encounter for screening for infections with a predominantly sexual mode of transmission: Secondary | ICD-10-CM | POA: Diagnosis not present

## 2020-03-21 DIAGNOSIS — N898 Other specified noninflammatory disorders of vagina: Secondary | ICD-10-CM | POA: Diagnosis not present

## 2020-03-22 DIAGNOSIS — M79641 Pain in right hand: Secondary | ICD-10-CM | POA: Diagnosis not present

## 2020-03-22 DIAGNOSIS — M79642 Pain in left hand: Secondary | ICD-10-CM | POA: Diagnosis not present

## 2020-03-22 DIAGNOSIS — M5136 Other intervertebral disc degeneration, lumbar region: Secondary | ICD-10-CM | POA: Diagnosis not present

## 2020-03-22 DIAGNOSIS — R519 Headache, unspecified: Secondary | ICD-10-CM | POA: Diagnosis not present

## 2020-03-22 DIAGNOSIS — M25561 Pain in right knee: Secondary | ICD-10-CM | POA: Diagnosis not present

## 2020-03-22 DIAGNOSIS — M25571 Pain in right ankle and joints of right foot: Secondary | ICD-10-CM | POA: Diagnosis not present

## 2020-03-22 DIAGNOSIS — M25572 Pain in left ankle and joints of left foot: Secondary | ICD-10-CM | POA: Diagnosis not present

## 2020-03-22 DIAGNOSIS — G894 Chronic pain syndrome: Secondary | ICD-10-CM | POA: Diagnosis not present

## 2020-03-22 DIAGNOSIS — G8929 Other chronic pain: Secondary | ICD-10-CM | POA: Diagnosis not present

## 2020-03-22 DIAGNOSIS — F1120 Opioid dependence, uncomplicated: Secondary | ICD-10-CM | POA: Diagnosis not present

## 2020-03-22 DIAGNOSIS — Z79891 Long term (current) use of opiate analgesic: Secondary | ICD-10-CM | POA: Diagnosis not present

## 2020-03-22 DIAGNOSIS — R52 Pain, unspecified: Secondary | ICD-10-CM | POA: Diagnosis not present

## 2020-03-22 DIAGNOSIS — M79604 Pain in right leg: Secondary | ICD-10-CM | POA: Diagnosis not present

## 2020-03-25 DIAGNOSIS — Z23 Encounter for immunization: Secondary | ICD-10-CM | POA: Diagnosis not present

## 2020-04-13 DIAGNOSIS — I10 Essential (primary) hypertension: Secondary | ICD-10-CM | POA: Diagnosis not present

## 2020-04-13 DIAGNOSIS — E782 Mixed hyperlipidemia: Secondary | ICD-10-CM | POA: Diagnosis not present

## 2020-04-13 DIAGNOSIS — Z789 Other specified health status: Secondary | ICD-10-CM | POA: Diagnosis not present

## 2020-04-14 DIAGNOSIS — M25571 Pain in right ankle and joints of right foot: Secondary | ICD-10-CM | POA: Diagnosis not present

## 2020-04-14 DIAGNOSIS — M79604 Pain in right leg: Secondary | ICD-10-CM | POA: Diagnosis not present

## 2020-04-14 DIAGNOSIS — M79641 Pain in right hand: Secondary | ICD-10-CM | POA: Diagnosis not present

## 2020-04-14 DIAGNOSIS — F1120 Opioid dependence, uncomplicated: Secondary | ICD-10-CM | POA: Diagnosis not present

## 2020-04-14 DIAGNOSIS — G894 Chronic pain syndrome: Secondary | ICD-10-CM | POA: Diagnosis not present

## 2020-04-14 DIAGNOSIS — R519 Headache, unspecified: Secondary | ICD-10-CM | POA: Diagnosis not present

## 2020-04-14 DIAGNOSIS — G8929 Other chronic pain: Secondary | ICD-10-CM | POA: Diagnosis not present

## 2020-04-14 DIAGNOSIS — Z79891 Long term (current) use of opiate analgesic: Secondary | ICD-10-CM | POA: Diagnosis not present

## 2020-04-14 DIAGNOSIS — M5136 Other intervertebral disc degeneration, lumbar region: Secondary | ICD-10-CM | POA: Diagnosis not present

## 2020-04-14 DIAGNOSIS — M79642 Pain in left hand: Secondary | ICD-10-CM | POA: Diagnosis not present

## 2020-04-14 DIAGNOSIS — M25572 Pain in left ankle and joints of left foot: Secondary | ICD-10-CM | POA: Diagnosis not present

## 2020-04-14 DIAGNOSIS — M25561 Pain in right knee: Secondary | ICD-10-CM | POA: Diagnosis not present

## 2020-04-14 DIAGNOSIS — R52 Pain, unspecified: Secondary | ICD-10-CM | POA: Diagnosis not present

## 2020-04-28 DIAGNOSIS — R32 Unspecified urinary incontinence: Secondary | ICD-10-CM | POA: Diagnosis not present

## 2020-05-10 DIAGNOSIS — I10 Essential (primary) hypertension: Secondary | ICD-10-CM | POA: Diagnosis not present

## 2020-05-10 DIAGNOSIS — Z789 Other specified health status: Secondary | ICD-10-CM | POA: Diagnosis not present

## 2020-05-10 DIAGNOSIS — E782 Mixed hyperlipidemia: Secondary | ICD-10-CM | POA: Diagnosis not present

## 2020-05-10 DIAGNOSIS — H5213 Myopia, bilateral: Secondary | ICD-10-CM | POA: Diagnosis not present

## 2020-05-10 DIAGNOSIS — Z0100 Encounter for examination of eyes and vision without abnormal findings: Secondary | ICD-10-CM | POA: Diagnosis not present

## 2020-05-12 DIAGNOSIS — M25561 Pain in right knee: Secondary | ICD-10-CM | POA: Diagnosis not present

## 2020-05-12 DIAGNOSIS — M25572 Pain in left ankle and joints of left foot: Secondary | ICD-10-CM | POA: Diagnosis not present

## 2020-05-12 DIAGNOSIS — F1120 Opioid dependence, uncomplicated: Secondary | ICD-10-CM | POA: Diagnosis not present

## 2020-05-12 DIAGNOSIS — M25571 Pain in right ankle and joints of right foot: Secondary | ICD-10-CM | POA: Diagnosis not present

## 2020-05-12 DIAGNOSIS — G8929 Other chronic pain: Secondary | ICD-10-CM | POA: Diagnosis not present

## 2020-05-12 DIAGNOSIS — Z79891 Long term (current) use of opiate analgesic: Secondary | ICD-10-CM | POA: Diagnosis not present

## 2020-05-12 DIAGNOSIS — J302 Other seasonal allergic rhinitis: Secondary | ICD-10-CM | POA: Diagnosis not present

## 2020-05-12 DIAGNOSIS — R52 Pain, unspecified: Secondary | ICD-10-CM | POA: Diagnosis not present

## 2020-05-12 DIAGNOSIS — J3081 Allergic rhinitis due to animal (cat) (dog) hair and dander: Secondary | ICD-10-CM | POA: Diagnosis not present

## 2020-05-12 DIAGNOSIS — M79604 Pain in right leg: Secondary | ICD-10-CM | POA: Diagnosis not present

## 2020-05-12 DIAGNOSIS — M79641 Pain in right hand: Secondary | ICD-10-CM | POA: Diagnosis not present

## 2020-05-12 DIAGNOSIS — G894 Chronic pain syndrome: Secondary | ICD-10-CM | POA: Diagnosis not present

## 2020-05-12 DIAGNOSIS — M5136 Other intervertebral disc degeneration, lumbar region: Secondary | ICD-10-CM | POA: Diagnosis not present

## 2020-05-12 DIAGNOSIS — M79642 Pain in left hand: Secondary | ICD-10-CM | POA: Diagnosis not present

## 2020-05-18 DIAGNOSIS — R69 Illness, unspecified: Secondary | ICD-10-CM | POA: Diagnosis not present

## 2020-05-18 DIAGNOSIS — Z20822 Contact with and (suspected) exposure to covid-19: Secondary | ICD-10-CM | POA: Diagnosis not present

## 2020-05-18 DIAGNOSIS — R45850 Homicidal ideations: Secondary | ICD-10-CM | POA: Diagnosis not present

## 2020-05-18 DIAGNOSIS — R45851 Suicidal ideations: Secondary | ICD-10-CM | POA: Diagnosis not present

## 2020-05-20 DIAGNOSIS — R69 Illness, unspecified: Secondary | ICD-10-CM | POA: Diagnosis not present

## 2020-05-20 DIAGNOSIS — R45850 Homicidal ideations: Secondary | ICD-10-CM | POA: Diagnosis not present

## 2020-05-20 DIAGNOSIS — D649 Anemia, unspecified: Secondary | ICD-10-CM | POA: Diagnosis not present

## 2020-05-20 DIAGNOSIS — Z7984 Long term (current) use of oral hypoglycemic drugs: Secondary | ICD-10-CM | POA: Diagnosis not present

## 2020-05-20 DIAGNOSIS — I10 Essential (primary) hypertension: Secondary | ICD-10-CM | POA: Diagnosis not present

## 2020-05-20 DIAGNOSIS — G4700 Insomnia, unspecified: Secondary | ICD-10-CM | POA: Diagnosis not present

## 2020-05-20 DIAGNOSIS — R45851 Suicidal ideations: Secondary | ICD-10-CM | POA: Diagnosis not present

## 2020-05-20 DIAGNOSIS — F319 Bipolar disorder, unspecified: Secondary | ICD-10-CM | POA: Diagnosis not present

## 2020-05-20 DIAGNOSIS — E119 Type 2 diabetes mellitus without complications: Secondary | ICD-10-CM | POA: Diagnosis not present

## 2020-05-20 DIAGNOSIS — M62838 Other muscle spasm: Secondary | ICD-10-CM | POA: Diagnosis not present

## 2020-05-20 DIAGNOSIS — Z20822 Contact with and (suspected) exposure to covid-19: Secondary | ICD-10-CM | POA: Diagnosis not present

## 2020-05-26 DIAGNOSIS — Z7984 Long term (current) use of oral hypoglycemic drugs: Secondary | ICD-10-CM | POA: Diagnosis not present

## 2020-05-26 DIAGNOSIS — R45851 Suicidal ideations: Secondary | ICD-10-CM | POA: Diagnosis not present

## 2020-05-26 DIAGNOSIS — R69 Illness, unspecified: Secondary | ICD-10-CM | POA: Diagnosis not present

## 2020-05-26 DIAGNOSIS — R45850 Homicidal ideations: Secondary | ICD-10-CM | POA: Diagnosis not present

## 2020-05-26 DIAGNOSIS — I10 Essential (primary) hypertension: Secondary | ICD-10-CM | POA: Diagnosis not present

## 2020-05-26 DIAGNOSIS — G4700 Insomnia, unspecified: Secondary | ICD-10-CM | POA: Diagnosis not present

## 2020-05-26 DIAGNOSIS — M62838 Other muscle spasm: Secondary | ICD-10-CM | POA: Diagnosis not present

## 2020-05-26 DIAGNOSIS — E119 Type 2 diabetes mellitus without complications: Secondary | ICD-10-CM | POA: Diagnosis not present

## 2020-05-26 DIAGNOSIS — D649 Anemia, unspecified: Secondary | ICD-10-CM | POA: Diagnosis not present

## 2020-06-08 DIAGNOSIS — H52221 Regular astigmatism, right eye: Secondary | ICD-10-CM | POA: Diagnosis not present

## 2020-06-08 DIAGNOSIS — H524 Presbyopia: Secondary | ICD-10-CM | POA: Diagnosis not present

## 2020-06-09 DIAGNOSIS — M25561 Pain in right knee: Secondary | ICD-10-CM | POA: Diagnosis not present

## 2020-06-09 DIAGNOSIS — F1120 Opioid dependence, uncomplicated: Secondary | ICD-10-CM | POA: Diagnosis not present

## 2020-06-09 DIAGNOSIS — M79604 Pain in right leg: Secondary | ICD-10-CM | POA: Diagnosis not present

## 2020-06-09 DIAGNOSIS — G8929 Other chronic pain: Secondary | ICD-10-CM | POA: Diagnosis not present

## 2020-06-09 DIAGNOSIS — J321 Chronic frontal sinusitis: Secondary | ICD-10-CM | POA: Diagnosis not present

## 2020-06-09 DIAGNOSIS — G894 Chronic pain syndrome: Secondary | ICD-10-CM | POA: Diagnosis not present

## 2020-06-09 DIAGNOSIS — Z79891 Long term (current) use of opiate analgesic: Secondary | ICD-10-CM | POA: Diagnosis not present

## 2020-06-09 DIAGNOSIS — M25572 Pain in left ankle and joints of left foot: Secondary | ICD-10-CM | POA: Diagnosis not present

## 2020-06-09 DIAGNOSIS — M25571 Pain in right ankle and joints of right foot: Secondary | ICD-10-CM | POA: Diagnosis not present

## 2020-06-09 DIAGNOSIS — R519 Headache, unspecified: Secondary | ICD-10-CM | POA: Diagnosis not present

## 2020-06-09 DIAGNOSIS — R52 Pain, unspecified: Secondary | ICD-10-CM | POA: Diagnosis not present

## 2020-06-09 DIAGNOSIS — M79642 Pain in left hand: Secondary | ICD-10-CM | POA: Diagnosis not present

## 2020-06-09 DIAGNOSIS — M79641 Pain in right hand: Secondary | ICD-10-CM | POA: Diagnosis not present

## 2020-07-07 DIAGNOSIS — Z789 Other specified health status: Secondary | ICD-10-CM | POA: Diagnosis not present

## 2020-07-07 DIAGNOSIS — M47812 Spondylosis without myelopathy or radiculopathy, cervical region: Secondary | ICD-10-CM | POA: Diagnosis not present

## 2020-07-07 DIAGNOSIS — M47816 Spondylosis without myelopathy or radiculopathy, lumbar region: Secondary | ICD-10-CM | POA: Diagnosis not present

## 2020-07-07 DIAGNOSIS — I10 Essential (primary) hypertension: Secondary | ICD-10-CM | POA: Diagnosis not present

## 2020-07-12 DIAGNOSIS — M79642 Pain in left hand: Secondary | ICD-10-CM | POA: Diagnosis not present

## 2020-07-12 DIAGNOSIS — M25561 Pain in right knee: Secondary | ICD-10-CM | POA: Diagnosis not present

## 2020-07-12 DIAGNOSIS — G8929 Other chronic pain: Secondary | ICD-10-CM | POA: Diagnosis not present

## 2020-07-12 DIAGNOSIS — J321 Chronic frontal sinusitis: Secondary | ICD-10-CM | POA: Diagnosis not present

## 2020-07-12 DIAGNOSIS — M25572 Pain in left ankle and joints of left foot: Secondary | ICD-10-CM | POA: Diagnosis not present

## 2020-07-12 DIAGNOSIS — M25571 Pain in right ankle and joints of right foot: Secondary | ICD-10-CM | POA: Diagnosis not present

## 2020-07-12 DIAGNOSIS — M5136 Other intervertebral disc degeneration, lumbar region: Secondary | ICD-10-CM | POA: Diagnosis not present

## 2020-07-12 DIAGNOSIS — R52 Pain, unspecified: Secondary | ICD-10-CM | POA: Diagnosis not present

## 2020-07-12 DIAGNOSIS — M79641 Pain in right hand: Secondary | ICD-10-CM | POA: Diagnosis not present

## 2020-07-12 DIAGNOSIS — M79604 Pain in right leg: Secondary | ICD-10-CM | POA: Diagnosis not present

## 2020-08-08 ENCOUNTER — Emergency Department (HOSPITAL_COMMUNITY)
Admission: EM | Admit: 2020-08-08 | Discharge: 2020-08-08 | Disposition: A | Discharge status: Home / Self Care | Payer: Medicare HMO | Attending: Emergency Medicine | Admitting: Emergency Medicine

## 2020-08-08 ENCOUNTER — Other Ambulatory Visit: Payer: Self-pay

## 2020-08-08 ENCOUNTER — Emergency Department (HOSPITAL_COMMUNITY): Payer: Medicare HMO

## 2020-08-08 DIAGNOSIS — R42 Dizziness and giddiness: Secondary | ICD-10-CM | POA: Diagnosis not present

## 2020-08-08 DIAGNOSIS — R059 Cough, unspecified: Secondary | ICD-10-CM | POA: Diagnosis not present

## 2020-08-08 DIAGNOSIS — I10 Essential (primary) hypertension: Secondary | ICD-10-CM | POA: Insufficient documentation

## 2020-08-08 DIAGNOSIS — E119 Type 2 diabetes mellitus without complications: Secondary | ICD-10-CM | POA: Insufficient documentation

## 2020-08-08 DIAGNOSIS — R350 Frequency of micturition: Secondary | ICD-10-CM | POA: Insufficient documentation

## 2020-08-08 DIAGNOSIS — R1111 Vomiting without nausea: Secondary | ICD-10-CM | POA: Diagnosis not present

## 2020-08-08 DIAGNOSIS — R0789 Other chest pain: Secondary | ICD-10-CM | POA: Diagnosis not present

## 2020-08-08 DIAGNOSIS — R112 Nausea with vomiting, unspecified: Secondary | ICD-10-CM | POA: Insufficient documentation

## 2020-08-08 DIAGNOSIS — R1013 Epigastric pain: Secondary | ICD-10-CM | POA: Diagnosis not present

## 2020-08-08 DIAGNOSIS — Z20822 Contact with and (suspected) exposure to covid-19: Secondary | ICD-10-CM | POA: Diagnosis not present

## 2020-08-08 DIAGNOSIS — R079 Chest pain, unspecified: Secondary | ICD-10-CM | POA: Insufficient documentation

## 2020-08-08 DIAGNOSIS — R69 Illness, unspecified: Secondary | ICD-10-CM | POA: Diagnosis not present

## 2020-08-08 DIAGNOSIS — R0689 Other abnormalities of breathing: Secondary | ICD-10-CM | POA: Diagnosis not present

## 2020-08-08 DIAGNOSIS — Z79899 Other long term (current) drug therapy: Secondary | ICD-10-CM | POA: Diagnosis not present

## 2020-08-08 DIAGNOSIS — F17210 Nicotine dependence, cigarettes, uncomplicated: Secondary | ICD-10-CM | POA: Diagnosis not present

## 2020-08-08 DIAGNOSIS — R0602 Shortness of breath: Secondary | ICD-10-CM | POA: Diagnosis not present

## 2020-08-08 DIAGNOSIS — R519 Headache, unspecified: Secondary | ICD-10-CM | POA: Diagnosis not present

## 2020-08-08 LAB — CBC WITH DIFFERENTIAL/PLATELET
Abs Immature Granulocytes: 0.03 10*3/uL (ref 0.00–0.07)
Basophils Absolute: 0.1 10*3/uL (ref 0.0–0.1)
Basophils Relative: 1 %
Eosinophils Absolute: 0.3 10*3/uL (ref 0.0–0.5)
Eosinophils Relative: 3 %
HCT: 39.1 % (ref 36.0–46.0)
Hemoglobin: 12.6 g/dL (ref 12.0–15.0)
Immature Granulocytes: 0 %
Lymphocytes Relative: 47 %
Lymphs Abs: 4.7 10*3/uL — ABNORMAL HIGH (ref 0.7–4.0)
MCH: 28.1 pg (ref 26.0–34.0)
MCHC: 32.2 g/dL (ref 30.0–36.0)
MCV: 87.1 fL (ref 80.0–100.0)
Monocytes Absolute: 1 10*3/uL (ref 0.1–1.0)
Monocytes Relative: 10 %
Neutro Abs: 3.9 10*3/uL (ref 1.7–7.7)
Neutrophils Relative %: 39 %
Platelets: 465 10*3/uL — ABNORMAL HIGH (ref 150–400)
RBC: 4.49 MIL/uL (ref 3.87–5.11)
RDW: 14.6 % (ref 11.5–15.5)
WBC: 10.1 10*3/uL (ref 4.0–10.5)
nRBC: 0 % (ref 0.0–0.2)

## 2020-08-08 LAB — LIPASE, BLOOD: Lipase: 22 U/L (ref 11–51)

## 2020-08-08 LAB — TROPONIN I (HIGH SENSITIVITY): Troponin I (High Sensitivity): 2 ng/L (ref <18)

## 2020-08-08 LAB — HEPATIC FUNCTION PANEL
ALT: 7 U/L (ref 0–44)
AST: 13 U/L — ABNORMAL LOW (ref 15–41)
Albumin: 3.3 g/dL — ABNORMAL LOW (ref 3.5–5.0)
Alkaline Phosphatase: 67 U/L (ref 38–126)
Bilirubin, Direct: <0.1 mg/dL (ref 0.0–0.2)
Total Bilirubin: 0.2 mg/dL — ABNORMAL LOW (ref 0.3–1.2)
Total Protein: 7.5 g/dL (ref 6.5–8.1)

## 2020-08-08 LAB — RESP PANEL BY RT-PCR (FLU A&B, COVID) ARPGX2
Influenza A by PCR: NEGATIVE
Influenza B by PCR: NEGATIVE
SARS Coronavirus 2 by RT PCR: NEGATIVE

## 2020-08-08 LAB — PREGNANCY, URINE: Preg Test, Ur: NEGATIVE

## 2020-08-08 LAB — BASIC METABOLIC PANEL
Anion gap: 4 — ABNORMAL LOW (ref 5–15)
BUN: 15 mg/dL (ref 6–20)
CO2: 25 mmol/L (ref 22–32)
Calcium: 8.7 mg/dL — ABNORMAL LOW (ref 8.9–10.3)
Chloride: 107 mmol/L (ref 98–111)
Creatinine, Ser: 0.57 mg/dL (ref 0.44–1.00)
GFR, Estimated: >60 mL/min (ref >60)
Glucose, Bld: 79 mg/dL (ref 70–99)
Potassium: 3.9 mmol/L (ref 3.5–5.1)
Sodium: 136 mmol/L (ref 135–145)

## 2020-08-08 MED ORDER — METOCLOPRAMIDE HCL 5 MG/ML IJ SOLN
10.0000 mg | Freq: Once | INTRAMUSCULAR | Status: AC
  Administered 2020-08-08: 10 mg via INTRAVENOUS
  Filled 2020-08-08: qty 2

## 2020-08-08 MED ORDER — DIPHENHYDRAMINE HCL 50 MG/ML IJ SOLN
50.0000 mg | Freq: Once | INTRAMUSCULAR | Status: AC
  Administered 2020-08-08: 50 mg via INTRAVENOUS
  Filled 2020-08-08: qty 1

## 2020-08-08 MED ORDER — KETOROLAC TROMETHAMINE 30 MG/ML IJ SOLN
30.0000 mg | Freq: Once | INTRAMUSCULAR | Status: AC
  Administered 2020-08-08: 30 mg via INTRAMUSCULAR
  Filled 2020-08-08: qty 1

## 2020-08-08 MED ORDER — ALUM & MAG HYDROXIDE-SIMETH 200-200-20 MG/5ML PO SUSP
30.0000 mL | Freq: Once | ORAL | Status: AC
  Administered 2020-08-08: 30 mL via ORAL
  Filled 2020-08-08: qty 30

## 2020-08-08 MED ORDER — OMEPRAZOLE 20 MG PO CPDR: 20.0000 mg | DELAYED_RELEASE_CAPSULE | Freq: Every day | ORAL | 0 refills | Status: DC

## 2020-08-08 NOTE — ED Triage Notes (Signed)
Pt is c/o a cough, h/a, and c/p that all started about 4 days ago and is just not getting any better.

## 2020-08-08 NOTE — Discharge Instructions (Addendum)
You came to the emergency department to be evaluated for your headache, stomach pain, nausea and vomiting.  Your physical exam, EKG, chest x-ray, and lab work were reassuring.  Your COVID-19 and flu test were negative.  Your symptoms improved after receiving a migraine cocktail and antinausea medication in the emergency department.    Please follow-up closely with your primary care provider.  Get help right away if: Your headache becomes severe quickly. Your headache gets worse after moderate to intense physical activity. You have repeated vomiting. You have a stiff neck. You have a loss of vision. You have problems with speech. You have pain in the eye or ear. You have muscular weakness or loss of muscle control. You lose your balance or have trouble walking. You feel faint or pass out. You have confusion. You have a seizure.

## 2020-08-08 NOTE — ED Provider Notes (Signed)
  Physical Exam  BP 136/71 (BP Location: Left Arm)   Pulse 61   Temp 98.6 F (37 C) (Oral)   Resp 17   Ht 5\' 9"  (1.753 m)   Wt 81.6 kg   SpO2 100%   BMI 26.58 kg/m   Physical Exam  ED Course/Procedures     Procedures  MDM    Care as patient assumed from preceding ED provider , PA-C at time of shift change.  Please see his associated note for further insight of the patient's ED course.  In brief patient presents with multiple complaints, headache, epigastric pain, and chest pain for the last 24 hours.  At time of shift change she has overall reassuring exam but is awaiting final laboratory studies.  Chest x-ray negative for acute cardiopulmonary disease.  EKG with normal sinus rhythm without ischemic changes.  CBC was unremarkable, BMP was unremarkable, hepatic function panel unremarkable.  Troponin negative, less than 2, lipase normal, patient is not pregnant.  Respiratory pathogen panel negative.  After administration of migraine cocktail patient does appear to be improving as well as administration of GI cocktail.  Time my reevaluation patient has some residual headache.  Will offer Toradol as she is tolerated this with success in the past.  Given her improvement in her central chest pain and burning sensation after GI cocktail, will refill prescription for Prilosec which the patient was previously on.    No further work-up warranted in ED this time given reassuring work-up and vital signs at this time. [facility] Stokes voiced understanding of her medical evaluation treatment plan.  Her questions answered to her expressed satisfaction.  Return precautions given.  Patient is well-appearing, stable, and appropriate discharge at this time.  This chart was dictated using voice recognition software, Dragon. Despite the best efforts of this provider to proofread and correct errors, errors may still occur which can change documentation meaning.    Cheryl Stokes 08/08/20 2058    2059, MD 08/09/20 [phone]

## 2020-08-08 NOTE — ED Provider Notes (Signed)
[facility] Provider Note   CSN: [id number] Arrival date & time: 08/08/20  1413     History Chief Complaint  Patient presents with   Chest Pain    Cheryl Stokes is a 45 y.o. female with a history of diabetes, hypertension, splenectomy.  Patient presents to the emergency department with multiple complaints.  Patient complains of headache, cough, nausea, vomiting, and epigastric pain.  Patient reports that her symptoms began 4 to 5 days prior.  Patient reports that headache is located throughout her entire head.  Headache has been constant over the last 4 to 5 days.  Headache onset was gradual and pain got progressively worse over time.  Patient reports similar to previous migraine she has had except pain is slightly worse.  Patient reports pain is worse with light and loud noises.  Patient has had minimal relief with over-the-counter pain medications.  Patient reports that she has been having epigastric abdominal pain.  Pain is constant over the last 4 to 5 days.  Pain is described as sharp.  Patient rates pain 10/10 on the pain scale.  Pain is relieved with Tums.  No aggravating factors.  Associated nausea and vomiting.  Patient had 1 episode of vomiting last 24 hours.  Emesis was described as stomach contents.  No hematemesis or coffee-ground emesis.  Patient endorses productive cough.  States that cough is producing white mucus.  Denies any shortness of breath.  Patient endorses chills and urinary frequency.  Patient denies any fevers, vaginal bleeding, vaginal discharge, vaginal pain, dysuria, hematuria, blood in stool, melena, constipation, diarrhea, numbness, weakness, facial asymmetry, slurred speech, visual disturbance.  No known sick contacts.  Patient was vaccinated for COVID-19 and received booster x2.   Chest Pain Associated symptoms: abdominal pain, cough, headache, nausea and vomiting   Associated symptoms: no back pain, no dizziness, no fever, no  numbness, no shortness of breath and no weakness       Past Medical History:  Diagnosis Date   Diabetes mellitus without complication (HCC)    Hypertension    Sleep apnea     There are no problems to display for this patient.   Past Surgical History:  Procedure Laterality Date   [facility]   CATARACT EXTRACTION W/PHACO Right 03/04/2020   Procedure: CATARACT EXTRACTION PHACO AND INTRAOCULAR LENS PLACEMENT (IOC);  Surgeon: Fabio PierceWrzosek, James, MD;  Location: [facility];  Service: Ophthalmology;  Laterality: Right;  CDE 2.51   CATARACT EXTRACTION W/PHACO Left 03/18/2020   Procedure: CATARACT EXTRACTION PHACO AND INTRAOCULAR LENS PLACEMENT (IOC);  Surgeon: Fabio PierceWrzosek, James, MD;  Location: [facility];  Service: Ophthalmology;  Laterality: Left;  CDE: 4.94   SPLENECTOMY     TONSILLECTOMY       OB History   No obstetric history on file.     No family history on file.  Social History   Tobacco Use   Smoking status: Every Day    Packs/day: 0.50    Types: Cigarettes   Smokeless tobacco: Never  Vaping Use   Vaping Use: Never used  Substance Use Topics   Alcohol use: No   Drug use: No    Home Medications Prior to Admission medications   Medication Sig Start Date End Date Taking? Authorizing Provider  Acetaminophen (MIDOL PO) Take 2 tablets by mouth every 6 (six) hours as needed (cramps).    [provider]  albuterol (VENTOLIN HFA) 108 (90 Base) MCG/ACT inhaler Inhale 2 puffs into the  lungs every 6 (six) hours as needed for wheezing or shortness of breath.    [provider]  chlorhexidine (PERIDEX) 0.12 % solution 15 mLs by Mouth Rinse route 2 (two) times daily. 02/17/20   [provider]  cholecalciferol (VITAMIN D) 25 MCG (1000 UNIT) tablet Take 1,000 Units by mouth daily.    [provider]  EPINEPHrine 0.3 mg/0.3 mL IJ SOAJ injection Inject 0.3 mg into the muscle once as needed (Allergies).  12/09/13   [provider]   ferrous sulfate 325 (65 FE) MG tablet Take 325 mg by mouth daily.    [provider]  ibuprofen (ADVIL,MOTRIN) 800 MG tablet Take 800 mg by mouth every 8 (eight) hours as needed for mild pain.     [provider]  ILEVRO 0.3 % ophthalmic suspension 1 drop See admin instructions. INSTILL ONE DROP EVERY DAY IN THE OPERATIVE EYE ONLY STARTING TWO DAYS BEFORE SURGERY AND CONTINUE AS DIRECTED 12/24/19   [provider]  labetalol (NORMODYNE) 100 MG tablet Take 100 mg by mouth 2 (two) times daily. 02/19/20   [provider]  moxifloxacin (VIGAMOX) 0.5 % ophthalmic solution 1 drop See admin instructions. INSTILL ONE DROP EVERY DAY IN THE OPERATIVE EYE ONLY STARTING TWO DAYS BEFORE SURGERY AND CONTINUE AS DIRECTED 12/24/19   [provider]  Multiple Vitamins-Minerals (ZINC PO) Take 22 mg by mouth daily.    [provider]  ondansetron (ZOFRAN ODT) 4 MG disintegrating tablet 4mg  ODT q4 hours prn nausea/vomit Patient taking differently: Take by mouth every 8 (eight) hours as needed for vomiting or nausea. 02/11/16   Mesner, 02/13/16, MD  Oxycodone HCl 10 MG TABS Take 10 mg by mouth 3 (three) times daily as needed (pain/pinched nerves). 06/21/14   [provider]  prednisoLONE acetate (PRED FORTE) 1 % ophthalmic suspension 1 drop See admin instructions. Instill 1 drop into the operative eye 3 times daily, starting 1 day after surgery 12/24/19   [provider]    Allergies    Bee venom, Tomato, Metformin and related, and Mobic [meloxicam]  Review of Systems   Review of Systems  Constitutional:  Positive for chills. Negative for fever.  HENT:  Negative for congestion, rhinorrhea and sore throat.   Eyes:  Negative for visual disturbance.  Respiratory:  Positive for cough. Negative for shortness of breath.   Cardiovascular:  Positive for chest pain.  Gastrointestinal:  Positive for abdominal pain, nausea and vomiting. Negative for abdominal  distention, anal bleeding, blood in stool, constipation, diarrhea and rectal pain.  Genitourinary:  Positive for frequency. Negative for difficulty urinating, dysuria, hematuria, vaginal bleeding, vaginal discharge and vaginal pain.  Musculoskeletal:  Negative for back pain and neck pain.  Skin:  Negative for color change and rash.  Neurological:  Positive for headaches. Negative for dizziness, tremors, seizures, syncope, facial asymmetry, speech difficulty, weakness, light-headedness and numbness.  Psychiatric/Behavioral:  Negative for confusion.    Physical Exam Updated Vital Signs BP (!) 145/73 (BP Location: Left Arm)   Pulse 71   Temp 98.2 F (36.8 C) (Oral)   Resp 18   Ht 5\' 9"  (1.753 m)   Wt 81.6 kg   SpO2 99%   BMI 26.58 kg/m   Physical Exam Vitals and nursing note reviewed.  Constitutional:      General: She is not in acute distress.    Appearance: She is not ill-appearing, toxic-appearing or diaphoretic.  Eyes:     General: No scleral icterus.  Right eye: No discharge.        Left eye: No discharge.     Extraocular Movements: Extraocular movements intact.     Pupils: Pupils are equal, round, and reactive to light.  Cardiovascular:     Rate and Rhythm: Normal rate.  Pulmonary:     Effort: Pulmonary effort is normal. No tachypnea, bradypnea or respiratory distress.     Breath sounds: Normal breath sounds. No stridor.  Abdominal:     General: Bowel sounds are normal. There is no distension. There are no signs of injury.     Palpations: Abdomen is soft. There is no mass or pulsatile mass.     Tenderness: There is abdominal tenderness in the epigastric area. There is no guarding or rebound.     Hernia: There is no hernia in the umbilical area or ventral area.  Musculoskeletal:     Cervical back: Normal range of motion and neck supple. No rigidity.     Right lower leg: No swelling or tenderness. No edema.     Left lower leg: No swelling or tenderness. No edema.   Skin:    General: Skin is warm and dry.     Coloration: Skin is not cyanotic or pale.  Neurological:     General: No focal deficit present.     Mental Status: She is alert and oriented to person, place, and time.     GCS: GCS eye subscore is 4. GCS verbal subscore is 5. GCS motor subscore is 6.     Cranial Nerves: No cranial nerve deficit or facial asymmetry.     Sensory: Sensation is intact.     Motor: No weakness, tremor, seizure activity or pronator drift.     Coordination: Finger-Nose-Finger Test normal.     Comments: CN II-XII intact; performed in supine position, grip strength equal +5 strength to bilateral upper extremities, +5 strength to dorsiflexion and plantarflexion, patient able to left both legs against gravity and hold each there without difficulty, sensation to light touch intact to bilateral upper and lower extremities.  Psychiatric:        Behavior: Behavior is cooperative.    ED Results / Procedures / Treatments   Labs (all labs ordered are listed, but only abnormal results are displayed) Labs Reviewed  BASIC METABOLIC PANEL - Abnormal; Notable for the following components:      Result Value   Calcium 8.7 (*)    Anion gap 4 (*)    All other components within normal limits  CBC WITH DIFFERENTIAL/PLATELET - Abnormal; Notable for the following components:   Platelets 465 (*)    Lymphs Abs 4.7 (*)    All other components within normal limits  RESP PANEL BY RT-PCR (FLU A&B, COVID) ARPGX2  HEPATIC FUNCTION PANEL  LIPASE, BLOOD  PREGNANCY, URINE  TROPONIN I (HIGH SENSITIVITY)    EKG EKG Interpretation  Date/Time:  Monday August 08 2020 16:59:17 EDT Ventricular Rate:  65 PR Interval:  168 QRS Duration: 74 QT Interval:  408 QTC Calculation: 424 R Axis:   46 Text Interpretation: Normal sinus rhythm with sinus arrhythmia Normal ECG rate slower than prior 2/18 Confirmed by Meridee Score [phone]) on 08/08/2020 5:07:43 PM  Radiology DG Chest Portable 1  View  Result Date: 08/08/2020 CLINICAL DATA:  Cough, shortness of breath EXAM: PORTABLE CHEST 1 VIEW COMPARISON:  02/20/2020 FINDINGS: The heart size and mediastinal contours are within normal limits. Both lungs are clear. The visualized skeletal structures are unremarkable. IMPRESSION: No acute  abnormality of the lungs. Electronically Signed   By: Lauralyn Primes M.D.   On: 08/08/2020 16:44    Procedures Procedures   Medications Ordered in ED Medications  alum & mag hydroxide-simeth (MAALOX/MYLANTA) 200-200-20 MG/5ML suspension 30 mL (30 mLs Oral Given 08/08/20 1710)  metoCLOPramide (REGLAN) injection 10 mg (10 mg Intravenous Given 08/08/20 1713)  diphenhydrAMINE (BENADRYL) injection 50 mg (50 mg Intravenous Given 08/08/20 1712)    ED Course  I have reviewed the triage vital signs and the nursing notes.  Pertinent labs & imaging results that were available during my care of the patient were reviewed by me and considered in my medical decision making (see chart for details).    MDM Rules/Calculators/A&P                           Alert 45 year old female no acute distress, nontoxic-appearing.  Patient presents to the emergency department with complains of headache, cough, nausea, vomiting, and epigastric pain.  Low suspicion for subarachnoid hemorrhage as headache onset was gradual and progressively worse over time.  No change in pain with exertion.  Patient has reported history of migraines.  Headache similar to previous migraines.  No neurological deficits noted on exam we will give patient migraine cocktail.  Will obtain ACS work-up due to patient's hypertension, diabetes, and complaints of abdominal pain.  We will give patient GI cocktail.  Chest x-ray shows no active cardiopulmonary disease EKG shows normal sinus rhythm Troponin 2 Heart Score 2 With chest pain being present for greater than 6 hours, heart score of 2, negative troponin no suspicion for ACS at this time.  Respiratory  panel negative for COVID-19 and influenza CBC unremarkable BMP unremarkable  Lipase, hepatic function, and urine pregnancy test pending at this time.  Patient reports that she had improvement in her headache, nausea and epigastric pain after receiving migraine cocktail.  Patient able to tolerate p.o. intake without difficulty.    If lab work is unremarkable plan to discharge patient to follow-up with primary care provider.  Patient care transferred to PA Sponseller at the end of my shift. Patient presentation, ED course, and plan of care discussed with review of all pertinent labs and imaging. Please see his/her note for further details regarding further ED course and disposition.   Final Clinical Impression(s) / ED Diagnoses Final diagnoses:  None    Rx / DC Orders ED Discharge Orders     None        Berneice Heinrich 08/08/20 2313    Terrilee Files, MD 08/09/20 [phone]

## 2020-08-17 DIAGNOSIS — Z20822 Contact with and (suspected) exposure to covid-19: Secondary | ICD-10-CM | POA: Diagnosis not present

## 2020-08-17 DIAGNOSIS — U071 COVID-19: Secondary | ICD-10-CM | POA: Diagnosis not present

## 2020-08-17 DIAGNOSIS — M791 Myalgia, unspecified site: Secondary | ICD-10-CM | POA: Diagnosis not present

## 2020-08-21 ENCOUNTER — Emergency Department (HOSPITAL_COMMUNITY)
Admission: EM | Admit: 2020-08-21 | Discharge: 2020-08-21 | Disposition: A | Discharge status: Home / Self Care | Payer: Medicare HMO | Attending: Emergency Medicine | Admitting: Emergency Medicine

## 2020-08-21 ENCOUNTER — Encounter (HOSPITAL_COMMUNITY): Payer: Self-pay | Admitting: Emergency Medicine

## 2020-08-21 ENCOUNTER — Other Ambulatory Visit: Payer: Self-pay

## 2020-08-21 ENCOUNTER — Emergency Department (HOSPITAL_COMMUNITY): Payer: Medicare HMO

## 2020-08-21 DIAGNOSIS — R059 Cough, unspecified: Secondary | ICD-10-CM | POA: Diagnosis present

## 2020-08-21 DIAGNOSIS — I10 Essential (primary) hypertension: Secondary | ICD-10-CM | POA: Diagnosis not present

## 2020-08-21 DIAGNOSIS — T7411XA Adult physical abuse, confirmed, initial encounter: Secondary | ICD-10-CM | POA: Insufficient documentation

## 2020-08-21 DIAGNOSIS — R69 Illness, unspecified: Secondary | ICD-10-CM | POA: Diagnosis not present

## 2020-08-21 DIAGNOSIS — E119 Type 2 diabetes mellitus without complications: Secondary | ICD-10-CM | POA: Insufficient documentation

## 2020-08-21 DIAGNOSIS — U071 COVID-19: Secondary | ICD-10-CM | POA: Insufficient documentation

## 2020-08-21 DIAGNOSIS — Z79899 Other long term (current) drug therapy: Secondary | ICD-10-CM | POA: Diagnosis not present

## 2020-08-21 DIAGNOSIS — F17210 Nicotine dependence, cigarettes, uncomplicated: Secondary | ICD-10-CM | POA: Diagnosis not present

## 2020-08-21 DIAGNOSIS — Z9189 Other specified personal risk factors, not elsewhere classified: Secondary | ICD-10-CM

## 2020-08-21 MED ORDER — ALBUTEROL SULFATE HFA 108 (90 BASE) MCG/ACT IN AERS
2.0000 | INHALATION_SPRAY | Freq: Once | RESPIRATORY_TRACT | Status: AC
  Administered 2020-08-21: 2 via RESPIRATORY_TRACT
  Filled 2020-08-21: qty 6.7

## 2020-08-21 MED ORDER — BENZONATATE 200 MG PO CAPS: 200.0000 mg | ORAL_CAPSULE | Freq: Three times a day (TID) | ORAL | 0 refills | Status: DC | PRN
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
## 2020-08-21 NOTE — Clinical Social Work Note (Signed)
CSW notified of patient's need for [employer] and transportation. CSW provided community resources and contacted [facility] transportation. [facility] agreeable to transport patient. TOC signing off.
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
## 2020-08-21 NOTE — ED Notes (Signed)
Pt left facility on own without notifying nurse despite SW setting up safe transportation to get her to her mothers house. She did leave with discharge paperwork and all information was reviewed with her.; She left while waiting on transportation to arrive.

## 2020-08-21 NOTE — ED Notes (Signed)
SW in to see pt. States they have a plan formulated for pt safety to return to mothers house at this time. Please refer to SW note

## 2020-08-21 NOTE — Discharge Instructions (Addendum)
Your chest x-ray today was negative for evidence of pneumonia.  Please use the albuterol inhaler, 1 to 2 puffs every 4-6 hours as needed.  I have written a prescription for a cough medication for you.  Recommend you continue to alternate Tylenol and ibuprofen every 4 and 6 hours if needed for body aches and/or fever.  You have been given a Pharmacist, hospital for local shelters that you may contact.  Please return to emergency department for any new or worsening symptoms.

## 2020-08-21 NOTE — ED Notes (Signed)
Pt states she has not had per blood pressure meds today yet. ED PA aware

## 2020-10-21 DIAGNOSIS — Z23 Encounter for immunization: Secondary | ICD-10-CM | POA: Diagnosis not present

## 2020-11-10 DIAGNOSIS — M79604 Pain in right leg: Secondary | ICD-10-CM | POA: Diagnosis not present

## 2020-11-10 DIAGNOSIS — M25572 Pain in left ankle and joints of left foot: Secondary | ICD-10-CM | POA: Diagnosis not present

## 2020-11-10 DIAGNOSIS — J302 Other seasonal allergic rhinitis: Secondary | ICD-10-CM | POA: Diagnosis not present

## 2020-11-10 DIAGNOSIS — M5136 Other intervertebral disc degeneration, lumbar region: Secondary | ICD-10-CM | POA: Diagnosis not present

## 2020-11-10 DIAGNOSIS — Z79891 Long term (current) use of opiate analgesic: Secondary | ICD-10-CM | POA: Diagnosis not present

## 2020-11-10 DIAGNOSIS — G8929 Other chronic pain: Secondary | ICD-10-CM | POA: Diagnosis not present

## 2020-11-10 DIAGNOSIS — J3081 Allergic rhinitis due to animal (cat) (dog) hair and dander: Secondary | ICD-10-CM | POA: Diagnosis not present

## 2020-11-10 DIAGNOSIS — M79641 Pain in right hand: Secondary | ICD-10-CM | POA: Diagnosis not present

## 2020-11-10 DIAGNOSIS — M25561 Pain in right knee: Secondary | ICD-10-CM | POA: Diagnosis not present

## 2020-11-10 DIAGNOSIS — G894 Chronic pain syndrome: Secondary | ICD-10-CM | POA: Diagnosis not present

## 2020-11-10 DIAGNOSIS — M25571 Pain in right ankle and joints of right foot: Secondary | ICD-10-CM | POA: Diagnosis not present

## 2020-11-10 DIAGNOSIS — M79642 Pain in left hand: Secondary | ICD-10-CM | POA: Diagnosis not present

## 2020-11-14 ENCOUNTER — Encounter: Payer: Self-pay | Admitting: *Deleted

## 2020-11-14 NOTE — Congregational Nurse Program (Signed)
  Dept: [phone]   Congregational Nurse Program Note  Date of Encounter: 11/14/2020  Past Medical History: Past Medical History:  Diagnosis Date   Diabetes mellitus without complication (HCC)    Hypertension    Sleep apnea     Encounter Details:  CNP Questionnaire - 11/14/20 1304       Questionnaire   Do you give verbal consent to treat you today? Yes    Location Patient Served  [facility]    Visit [place] or [address]Phone/Text/Email   [employer]   Patient Status [employer];Medicare    Insurance Referral N/A    Medication N/A    Medical Provider Yes    Screening Referrals N/A    Medical Referral [facility]    Medical Appointment Made [facility]   Walk-in referral   Food N/A    Transportation Provided transportation assistance    Housing/Utilities N/A   section 8 starts Nov 3rd   Interpersonal Safety N/A    Intervention Support;Counsel    ED Visit Averted N/A    Life-Saving Intervention Made N/A            Client came in nurses's office seeking help with mental health services including medication. Client has multiple stressors including the loss of multiple family members and relationships. She reports having panic attacks and not being on medication. Client denies si and is planning to move to [place] with section 8 housing. Called [facility] to check on appointments. Per call, have client come in tomorrow morning for walkin assessment. Gave two bus passes for transportation. Farouk Vivero W RN CN [phone]

## 2020-11-16 DIAGNOSIS — G4700 Insomnia, unspecified: Secondary | ICD-10-CM | POA: Diagnosis not present

## 2020-11-16 DIAGNOSIS — J069 Acute upper respiratory infection, unspecified: Secondary | ICD-10-CM | POA: Diagnosis not present

## 2020-11-16 DIAGNOSIS — Z20822 Contact with and (suspected) exposure to covid-19: Secondary | ICD-10-CM | POA: Diagnosis not present

## 2020-11-16 DIAGNOSIS — Z9189 Other specified personal risk factors, not elsewhere classified: Secondary | ICD-10-CM | POA: Diagnosis not present

## 2020-11-24 DIAGNOSIS — K219 Gastro-esophageal reflux disease without esophagitis: Secondary | ICD-10-CM | POA: Diagnosis not present

## 2020-11-24 DIAGNOSIS — Z888 Allergy status to other drugs, medicaments and biological substances status: Secondary | ICD-10-CM | POA: Diagnosis not present

## 2020-11-24 DIAGNOSIS — Z818 Family history of other mental and behavioral disorders: Secondary | ICD-10-CM | POA: Diagnosis not present

## 2020-11-24 DIAGNOSIS — R32 Unspecified urinary incontinence: Secondary | ICD-10-CM | POA: Diagnosis not present

## 2020-11-24 DIAGNOSIS — F324 Major depressive disorder, single episode, in partial remission: Secondary | ICD-10-CM | POA: Diagnosis not present

## 2020-11-24 DIAGNOSIS — R69 Illness, unspecified: Secondary | ICD-10-CM | POA: Diagnosis not present

## 2020-11-24 DIAGNOSIS — G4700 Insomnia, unspecified: Secondary | ICD-10-CM | POA: Diagnosis not present

## 2020-11-24 DIAGNOSIS — F411 Generalized anxiety disorder: Secondary | ICD-10-CM | POA: Diagnosis not present

## 2020-11-24 DIAGNOSIS — G8929 Other chronic pain: Secondary | ICD-10-CM | POA: Diagnosis not present

## 2020-11-24 DIAGNOSIS — F17210 Nicotine dependence, cigarettes, uncomplicated: Secondary | ICD-10-CM | POA: Diagnosis not present

## 2020-11-24 DIAGNOSIS — Z8612 Personal history of poliomyelitis: Secondary | ICD-10-CM | POA: Diagnosis not present

## 2020-11-24 DIAGNOSIS — I10 Essential (primary) hypertension: Secondary | ICD-10-CM | POA: Diagnosis not present

## 2020-11-24 DIAGNOSIS — Z825 Family history of asthma and other chronic lower respiratory diseases: Secondary | ICD-10-CM | POA: Diagnosis not present

## 2020-11-24 DIAGNOSIS — Z008 Encounter for other general examination: Secondary | ICD-10-CM | POA: Diagnosis not present

## 2020-11-29 DIAGNOSIS — Z01419 Encounter for gynecological examination (general) (routine) without abnormal findings: Secondary | ICD-10-CM | POA: Diagnosis not present

## 2020-11-29 DIAGNOSIS — Z118 Encounter for screening for other infectious and parasitic diseases: Secondary | ICD-10-CM | POA: Diagnosis not present

## 2020-11-29 DIAGNOSIS — N979 Female infertility, unspecified: Secondary | ICD-10-CM | POA: Diagnosis not present

## 2020-11-29 DIAGNOSIS — Z01411 Encounter for gynecological examination (general) (routine) with abnormal findings: Secondary | ICD-10-CM | POA: Diagnosis not present

## 2020-11-29 DIAGNOSIS — Z1159 Encounter for screening for other viral diseases: Secondary | ICD-10-CM | POA: Diagnosis not present

## 2020-11-29 DIAGNOSIS — Z6828 Body mass index (BMI) 28.0-28.9, adult: Secondary | ICD-10-CM | POA: Diagnosis not present

## 2020-11-29 DIAGNOSIS — Z113 Encounter for screening for infections with a predominantly sexual mode of transmission: Secondary | ICD-10-CM | POA: Diagnosis not present

## 2020-11-29 DIAGNOSIS — Z3009 Encounter for other general counseling and advice on contraception: Secondary | ICD-10-CM | POA: Diagnosis not present

## 2020-11-29 DIAGNOSIS — R69 Illness, unspecified: Secondary | ICD-10-CM | POA: Diagnosis not present

## 2020-11-29 DIAGNOSIS — Z124 Encounter for screening for malignant neoplasm of cervix: Secondary | ICD-10-CM | POA: Diagnosis not present

## 2020-11-29 DIAGNOSIS — Z114 Encounter for screening for human immunodeficiency virus [HIV]: Secondary | ICD-10-CM | POA: Diagnosis not present

## 2020-11-29 DIAGNOSIS — N926 Irregular menstruation, unspecified: Secondary | ICD-10-CM | POA: Diagnosis not present

## 2021-02-14 DIAGNOSIS — R5383 Other fatigue: Secondary | ICD-10-CM | POA: Diagnosis not present

## 2021-02-14 DIAGNOSIS — M5441 Lumbago with sciatica, right side: Secondary | ICD-10-CM | POA: Diagnosis not present

## 2021-02-14 DIAGNOSIS — E7800 Pure hypercholesterolemia, unspecified: Secondary | ICD-10-CM | POA: Diagnosis not present

## 2021-02-14 DIAGNOSIS — G8929 Other chronic pain: Secondary | ICD-10-CM | POA: Diagnosis not present

## 2021-02-14 DIAGNOSIS — E559 Vitamin D deficiency, unspecified: Secondary | ICD-10-CM | POA: Diagnosis not present

## 2021-02-14 DIAGNOSIS — Z79899 Other long term (current) drug therapy: Secondary | ICD-10-CM | POA: Diagnosis not present

## 2021-02-14 DIAGNOSIS — M129 Arthropathy, unspecified: Secondary | ICD-10-CM | POA: Diagnosis not present

## 2021-02-14 DIAGNOSIS — R030 Elevated blood-pressure reading, without diagnosis of hypertension: Secondary | ICD-10-CM | POA: Diagnosis not present

## 2021-02-14 DIAGNOSIS — Z6831 Body mass index (BMI) 31.0-31.9, adult: Secondary | ICD-10-CM | POA: Diagnosis not present

## 2021-02-14 DIAGNOSIS — Z0000 Encounter for general adult medical examination without abnormal findings: Secondary | ICD-10-CM | POA: Diagnosis not present

## 2021-02-14 DIAGNOSIS — R7303 Prediabetes: Secondary | ICD-10-CM | POA: Diagnosis not present

## 2021-02-14 DIAGNOSIS — I10 Essential (primary) hypertension: Secondary | ICD-10-CM | POA: Diagnosis not present

## 2021-03-08 DIAGNOSIS — Z87891 Personal history of nicotine dependence: Secondary | ICD-10-CM | POA: Diagnosis not present

## 2021-03-08 DIAGNOSIS — S39012A Strain of muscle, fascia and tendon of lower back, initial encounter: Secondary | ICD-10-CM | POA: Diagnosis not present

## 2021-03-08 DIAGNOSIS — M542 Cervicalgia: Secondary | ICD-10-CM | POA: Diagnosis not present

## 2021-03-08 DIAGNOSIS — R52 Pain, unspecified: Secondary | ICD-10-CM | POA: Diagnosis not present

## 2021-03-08 DIAGNOSIS — I10 Essential (primary) hypertension: Secondary | ICD-10-CM | POA: Diagnosis not present

## 2021-03-08 DIAGNOSIS — S161XXA Strain of muscle, fascia and tendon at neck level, initial encounter: Secondary | ICD-10-CM | POA: Diagnosis not present

## 2021-03-08 DIAGNOSIS — M545 Low back pain, unspecified: Secondary | ICD-10-CM | POA: Diagnosis not present

## 2021-03-08 DIAGNOSIS — Y92481 Parking lot as the place of occurrence of the external cause: Secondary | ICD-10-CM | POA: Diagnosis not present

## 2021-03-14 DIAGNOSIS — M47816 Spondylosis without myelopathy or radiculopathy, lumbar region: Secondary | ICD-10-CM | POA: Diagnosis not present

## 2021-03-14 DIAGNOSIS — M47812 Spondylosis without myelopathy or radiculopathy, cervical region: Secondary | ICD-10-CM | POA: Diagnosis not present

## 2021-03-14 DIAGNOSIS — M5441 Lumbago with sciatica, right side: Secondary | ICD-10-CM | POA: Diagnosis not present

## 2021-03-14 DIAGNOSIS — G8929 Other chronic pain: Secondary | ICD-10-CM | POA: Diagnosis not present

## 2021-03-14 DIAGNOSIS — Z6830 Body mass index (BMI) 30.0-30.9, adult: Secondary | ICD-10-CM | POA: Diagnosis not present

## 2021-03-14 DIAGNOSIS — R87610 Atypical squamous cells of undetermined significance on cytologic smear of cervix (ASC-US): Secondary | ICD-10-CM | POA: Diagnosis not present

## 2021-03-14 DIAGNOSIS — Z79899 Other long term (current) drug therapy: Secondary | ICD-10-CM | POA: Diagnosis not present

## 2021-03-22 DIAGNOSIS — S161XXS Strain of muscle, fascia and tendon at neck level, sequela: Secondary | ICD-10-CM | POA: Diagnosis not present

## 2021-03-28 ENCOUNTER — Ambulatory Visit: Payer: Medicare HMO | Admitting: Orthopaedic Surgery

## 2021-03-30 ENCOUNTER — Ambulatory Visit: Payer: Medicare HMO | Admitting: Orthopaedic Surgery

## 2021-04-11 ENCOUNTER — Other Ambulatory Visit: Payer: Self-pay

## 2021-04-11 ENCOUNTER — Ambulatory Visit (INDEPENDENT_AMBULATORY_CARE_PROVIDER_SITE_OTHER): Payer: Medicare HMO | Admitting: Orthopaedic Surgery

## 2021-04-11 ENCOUNTER — Ambulatory Visit (INDEPENDENT_AMBULATORY_CARE_PROVIDER_SITE_OTHER): Payer: Medicare HMO

## 2021-04-11 ENCOUNTER — Encounter: Payer: Self-pay | Admitting: Orthopaedic Surgery

## 2021-04-11 DIAGNOSIS — M542 Cervicalgia: Secondary | ICD-10-CM

## 2021-04-11 DIAGNOSIS — Z6829 Body mass index (BMI) 29.0-29.9, adult: Secondary | ICD-10-CM | POA: Diagnosis not present

## 2021-04-11 DIAGNOSIS — M5441 Lumbago with sciatica, right side: Secondary | ICD-10-CM | POA: Diagnosis not present

## 2021-04-11 DIAGNOSIS — M545 Low back pain, unspecified: Secondary | ICD-10-CM

## 2021-04-11 DIAGNOSIS — I10 Essential (primary) hypertension: Secondary | ICD-10-CM | POA: Diagnosis not present

## 2021-04-11 DIAGNOSIS — Z79899 Other long term (current) drug therapy: Secondary | ICD-10-CM | POA: Diagnosis not present

## 2021-04-11 DIAGNOSIS — G4700 Insomnia, unspecified: Secondary | ICD-10-CM | POA: Diagnosis not present

## 2021-04-11 DIAGNOSIS — G8929 Other chronic pain: Secondary | ICD-10-CM | POA: Diagnosis not present
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
## 2021-04-13 DIAGNOSIS — Z79899 Other long term (current) drug therapy: Secondary | ICD-10-CM | POA: Diagnosis not present

## 2021-05-12 DIAGNOSIS — G4700 Insomnia, unspecified: Secondary | ICD-10-CM | POA: Diagnosis not present

## 2021-05-12 DIAGNOSIS — Z79899 Other long term (current) drug therapy: Secondary | ICD-10-CM | POA: Diagnosis not present

## 2021-05-12 DIAGNOSIS — M5441 Lumbago with sciatica, right side: Secondary | ICD-10-CM | POA: Diagnosis not present

## 2021-05-12 DIAGNOSIS — G8929 Other chronic pain: Secondary | ICD-10-CM | POA: Diagnosis not present

## 2021-05-12 DIAGNOSIS — I10 Essential (primary) hypertension: Secondary | ICD-10-CM | POA: Diagnosis not present

## 2021-05-12 DIAGNOSIS — Z6829 Body mass index (BMI) 29.0-29.9, adult: Secondary | ICD-10-CM | POA: Diagnosis not present

## 2021-05-17 DIAGNOSIS — Z1152 Encounter for screening for COVID-19: Secondary | ICD-10-CM | POA: Diagnosis not present

## 2021-05-18 DIAGNOSIS — Z79899 Other long term (current) drug therapy: Secondary | ICD-10-CM | POA: Diagnosis not present

## 2021-06-14 DIAGNOSIS — M5441 Lumbago with sciatica, right side: Secondary | ICD-10-CM | POA: Diagnosis not present

## 2021-06-14 DIAGNOSIS — Z6829 Body mass index (BMI) 29.0-29.9, adult: Secondary | ICD-10-CM | POA: Diagnosis not present

## 2021-06-14 DIAGNOSIS — G8929 Other chronic pain: Secondary | ICD-10-CM | POA: Diagnosis not present

## 2021-06-14 DIAGNOSIS — Z79899 Other long term (current) drug therapy: Secondary | ICD-10-CM | POA: Diagnosis not present

## 2021-06-14 DIAGNOSIS — G4700 Insomnia, unspecified: Secondary | ICD-10-CM | POA: Diagnosis not present

## 2021-06-19 DIAGNOSIS — Z79899 Other long term (current) drug therapy: Secondary | ICD-10-CM | POA: Diagnosis not present

## 2021-06-25 ENCOUNTER — Emergency Department (HOSPITAL_COMMUNITY): Payer: Medicare HMO

## 2021-06-25 ENCOUNTER — Emergency Department (HOSPITAL_COMMUNITY)
Admission: EM | Admit: 2021-06-25 | Discharge: 2021-06-25 | Disposition: A | Discharge status: Home / Self Care | Payer: Medicare HMO | Attending: Emergency Medicine | Admitting: Emergency Medicine

## 2021-06-25 ENCOUNTER — Encounter (HOSPITAL_COMMUNITY): Payer: Self-pay | Admitting: *Deleted

## 2021-06-25 ENCOUNTER — Other Ambulatory Visit: Payer: Self-pay

## 2021-06-25 DIAGNOSIS — R079 Chest pain, unspecified: Secondary | ICD-10-CM

## 2021-06-25 DIAGNOSIS — R69 Illness, unspecified: Secondary | ICD-10-CM | POA: Diagnosis not present

## 2021-06-25 DIAGNOSIS — M791 Myalgia, unspecified site: Secondary | ICD-10-CM | POA: Insufficient documentation

## 2021-06-25 DIAGNOSIS — R0789 Other chest pain: Secondary | ICD-10-CM | POA: Insufficient documentation

## 2021-06-25 DIAGNOSIS — Z20822 Contact with and (suspected) exposure to covid-19: Secondary | ICD-10-CM | POA: Diagnosis not present

## 2021-06-25 DIAGNOSIS — R072 Precordial pain: Secondary | ICD-10-CM | POA: Diagnosis present

## 2021-06-25 HISTORY — DX: Encounter for other specified aftercare: Z51.89

## 2021-06-25 LAB — CBC
HCT: 41.7 % (ref 36.0–46.0)
Hemoglobin: 12.9 g/dL (ref 12.0–15.0)
MCH: 26.7 pg (ref 26.0–34.0)
MCHC: 30.9 g/dL (ref 30.0–36.0)
MCV: 86.2 fL (ref 80.0–100.0)
Platelets: 499 K/uL — ABNORMAL HIGH (ref 150–400)
RBC: 4.84 MIL/uL (ref 3.87–5.11)
RDW: 15.5 % (ref 11.5–15.5)
WBC: 11.3 K/uL — ABNORMAL HIGH (ref 4.0–10.5)
nRBC: 0 % (ref 0.0–0.2)

## 2021-06-25 LAB — BASIC METABOLIC PANEL
Anion gap: 2 — ABNORMAL LOW (ref 5–15)
BUN: 13 mg/dL (ref 6–20)
CO2: 28 mmol/L (ref 22–32)
Calcium: 8.7 mg/dL — ABNORMAL LOW (ref 8.9–10.3)
Chloride: 108 mmol/L (ref 98–111)
Creatinine, Ser: 0.7 mg/dL (ref 0.44–1.00)
GFR, Estimated: >60 mL/min (ref >60)
Glucose, Bld: 66 mg/dL — ABNORMAL LOW (ref 70–99)
Potassium: 4.2 mmol/L (ref 3.5–5.1)
Sodium: 138 mmol/L (ref 135–145)

## 2021-06-25 LAB — SARS CORONAVIRUS 2 BY RT PCR: SARS Coronavirus 2 by RT PCR: NEGATIVE

## 2021-06-25 LAB — TROPONIN I (HIGH SENSITIVITY)
Troponin I (High Sensitivity): 2 ng/L (ref <18)
Troponin I (High Sensitivity): <2 ng/L (ref <18)

## 2021-06-25 MED ORDER — OXYCODONE-ACETAMINOPHEN 5-325 MG PO TABS
2.0000 | ORAL_TABLET | Freq: Once | ORAL | Status: AC
  Administered 2021-06-25: 2 via ORAL
  Filled 2021-06-25: qty 2

## 2021-06-25 NOTE — ED Notes (Signed)
Pt got dressed and left the ER

## 2021-06-25 NOTE — ED Notes (Signed)
Patient eating sandwich and drinking juice at this time.
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
## 2021-06-25 NOTE — ED Notes (Signed)
Pt in room arguing with visitor, wants to leave one minute and then wants to talk to someone about nerves the next.

## 2021-06-25 NOTE — ED Provider Notes (Signed)
[facility] EMERGENCY DEPARTMENT Provider Note   CSN: [id number] Arrival date & time: 06/25/21  1343     History  Chief Complaint  Patient presents with   Chest Pain    Cheryl Stokes is a 46 y.o. female.  Complains of discomfort in her chest , she reports she has pain all over her body.  Patient reports that she has chronic pain and is on oxycodone for pain but she is not having any relief from her pain medicine.  Patient reports that she has had COVID twice in the past. she is concerned that she could have COVID again.  Patient denies any fever or chills.   The history is provided by the patient. No language interpreter was used.  Chest Pain Pain location:  Substernal area Pain quality: aching   Pain radiates to:  Does not radiate Pain severity:  Moderate Onset quality:  Gradual Duration:  2 days Timing:  Constant Chronicity:  New Relieved by:  Nothing Worsened by:  Nothing Associated symptoms: fatigue and nausea        Home Medications Prior to Admission medications   Medication Sig Start Date End Date Taking? Authorizing Provider  Acetaminophen (MIDOL PO) Take 2 tablets by mouth every 6 (six) hours as needed (cramps).    [provider]  albuterol (VENTOLIN HFA) 108 (90 Base) MCG/ACT inhaler Inhale 2 puffs into the lungs every 6 (six) hours as needed for wheezing or shortness of breath.    [provider]  benzonatate (TESSALON) 200 MG capsule Take 1 capsule (200 mg total) by mouth 3 (three) times daily as needed for cough. Swallow whole, do not chew 08/21/20   Triplett, Tammy, PA-C  chlorhexidine (PERIDEX) 0.12 % solution 15 mLs by Mouth Rinse route 2 (two) times daily. Patient not taking: Reported on 08/08/2020 02/17/20   [provider]  cholecalciferol (VITAMIN D) 25 MCG (1000 UNIT) tablet Take 1,000 Units by mouth daily.    [provider]  EPINEPHrine 0.3 mg/0.3 mL IJ SOAJ injection Inject 0.3 mg into the muscle once as needed  (Allergies).  12/09/13   [provider]  ferrous sulfate 325 (65 FE) MG tablet Take 325 mg by mouth daily.    [provider]  hydrOXYzine (ATARAX/VISTARIL) 25 MG tablet Take 25 mg by mouth 3 (three) times daily as needed. 06/06/20   [provider]  ibuprofen (ADVIL,MOTRIN) 800 MG tablet Take 800 mg by mouth every 8 (eight) hours as needed for mild pain.     [provider]  ILEVRO 0.3 % ophthalmic suspension 1 drop See admin instructions. INSTILL ONE DROP EVERY DAY IN THE OPERATIVE EYE ONLY STARTING TWO DAYS BEFORE SURGERY AND CONTINUE AS DIRECTED Patient not taking: Reported on 08/08/2020 12/24/19   [provider]  labetalol (NORMODYNE) 100 MG tablet Take 100 mg by mouth 3 (three) times daily. 02/19/20   [provider]  moxifloxacin (VIGAMOX) 0.5 % ophthalmic solution 1 drop See admin instructions. INSTILL ONE DROP EVERY DAY IN THE OPERATIVE EYE ONLY STARTING TWO DAYS BEFORE SURGERY AND CONTINUE AS DIRECTED Patient not taking: Reported on 08/08/2020 12/24/19   [provider]  Multiple Vitamins-Minerals (ZINC PO) Take 22 mg by mouth daily.    [provider]  omeprazole (PRILOSEC) 20 MG capsule Take 1 capsule (20 mg total) by mouth daily. 08/08/20 09/07/20  Sponseller, Lupe Carneyebekah R, PA-C  ondansetron (ZOFRAN ODT) 4 MG disintegrating tablet 4mg  ODT q4 hours prn nausea/vomit Patient taking differently: Take by  mouth every 8 (eight) hours as needed for vomiting or nausea. 02/11/16   Mesner, Barbara Cower, MD  Oxycodone HCl 10 MG TABS Take 10 mg by mouth 3 (three) times daily as needed (pain/pinched nerves). Patient not taking: Reported on 08/08/2020 06/21/14   [provider]  prednisoLONE acetate (PRED FORTE) 1 % ophthalmic suspension 1 drop See admin instructions. Instill 1 drop into the operative eye 3 times daily, starting 1 day after surgery Patient not taking: Reported on 08/08/2020 12/24/19   [provider]  tiZANidine  (ZANAFLEX) 4 MG tablet Take 4 mg by mouth every 8 (eight) hours. 05/13/20   [provider]  traZODone (DESYREL) 100 MG tablet Take 100 mg by mouth at bedtime. 06/06/20   [provider]      Allergies    Bee venom, Tomato, Metformin and related, and Mobic [meloxicam]    Review of Systems   Review of Systems  Constitutional:  Positive for fatigue.  Cardiovascular:  Positive for chest pain.  Gastrointestinal:  Positive for nausea.  All other systems reviewed and are negative.   Physical Exam Updated Vital Signs BP (!) 153/72   Pulse 70   Temp 98.4 F (36.9 C)   Resp 16   Ht 5\' 9"  (1.753 m)   Wt 86.2 kg   LMP 06/18/2021   SpO2 100%   BMI 28.06 kg/m  Physical Exam Vitals and nursing note reviewed.  Constitutional:      Appearance: She is well-developed.  HENT:     Head: Normocephalic.  Cardiovascular:     Rate and Rhythm: Normal rate and regular rhythm.     Heart sounds: Normal heart sounds.  Pulmonary:     Effort: Pulmonary effort is normal.     Breath sounds: Normal breath sounds.  Abdominal:     General: There is no distension.     Palpations: Abdomen is soft.  Musculoskeletal:        General: Normal range of motion.     Cervical back: Normal range of motion.  Neurological:     Mental Status: She is alert and oriented to person, place, and time.     ED Results / Procedures / Treatments   Labs (all labs ordered are listed, but only abnormal results are displayed) Labs Reviewed  BASIC METABOLIC PANEL - Abnormal; Notable for the following components:      Result Value   Glucose, Bld 66 (*)    Calcium 8.7 (*)    Anion gap 2 (*)    All other components within normal limits  SARS CORONAVIRUS 2 BY RT PCR  CBC  POC URINE PREG, ED  TROPONIN I (HIGH SENSITIVITY)  TROPONIN I (HIGH SENSITIVITY)    EKG None  Radiology DG Chest 2 View  Result Date: 06/25/2021 CLINICAL DATA:  Chest pain EXAM: CHEST - 2 VIEW COMPARISON:  08/21/2020 FINDINGS:  The heart size and mediastinal contours are within normal limits. Both lungs are clear. The visualized skeletal structures are unremarkable. IMPRESSION: No acute abnormality of the lungs. Electronically Signed   By: 10/21/2020 M.D.   On: 06/25/2021 14:19    Procedures Procedures    Medications Ordered in ED Medications  oxyCODONE-acetaminophen (PERCOCET/ROXICET) 5-325 MG per tablet 2 tablet (2 tablets Oral Given 06/25/21 1811)    ED Course/ Medical Decision Making/ A&P Clinical Course as of 06/25/21 1859  Sun Jun 25, 2021  1857 DC after repeat CBC -- no pain medication refills [CP]    Clinical Course User  Index [CP] Olene Floss, PA-C                           Medical Decision Making Patient complains of severe body pain as well as chest pain.  Patient has chronic pain  Amount and/or Complexity of Data Reviewed External Data Reviewed: notes. Labs: ordered. Decision-making details documented in ED Course.    Details: Labs ordered reviewed and interpreted troponin is normal.  patient's glucose is low at 66 patient is advised to eat and drink [place] is negative Radiology: ordered and independent interpretation performed. Decision-making details documented in ED Course.    Details: Chest x-ray ordered reviewed and interpreted patient has no acute changes  Risk Risk Details: Patient is given 2 Percocet here in the emergency department she is advised to add ibuprofen to her home pain management for the next 24 hours she is advised to call her physician to discuss her concerns that her symptoms are caused by Ozempic.    Pt's care turned over to [facility]  cbc is pening          Final Clinical Impression(s) / ED Diagnoses Final diagnoses:  Myalgia  Nonspecific chest pain    Rx / DC Orders ED Discharge Orders     None      An After Visit Summary was printed and given to the patient.    Osie Cheeks 06/25/21 1900    Cheryll Cockayne,  MD 07/03/21 [phone]

## 2021-06-25 NOTE — ED Triage Notes (Signed)
Pt with CP x 2 days, mid.  Generalized body pain  Started on new medication, now with emesis and sore throat, cough.

## 2021-07-14 DIAGNOSIS — Z79899 Other long term (current) drug therapy: Secondary | ICD-10-CM | POA: Diagnosis not present

## 2021-07-14 DIAGNOSIS — Z6829 Body mass index (BMI) 29.0-29.9, adult: Secondary | ICD-10-CM | POA: Diagnosis not present

## 2021-07-14 DIAGNOSIS — G8929 Other chronic pain: Secondary | ICD-10-CM | POA: Diagnosis not present

## 2021-07-14 DIAGNOSIS — M5441 Lumbago with sciatica, right side: Secondary | ICD-10-CM | POA: Diagnosis not present

## 2021-07-14 DIAGNOSIS — I10 Essential (primary) hypertension: Secondary | ICD-10-CM | POA: Diagnosis not present

## 2021-07-14 DIAGNOSIS — G4700 Insomnia, unspecified: Secondary | ICD-10-CM | POA: Diagnosis not present

## 2021-07-21 DIAGNOSIS — Z79899 Other long term (current) drug therapy: Secondary | ICD-10-CM | POA: Diagnosis not present

## 2021-07-23 DIAGNOSIS — F419 Anxiety disorder, unspecified: Secondary | ICD-10-CM | POA: Diagnosis not present

## 2021-07-23 DIAGNOSIS — Z87891 Personal history of nicotine dependence: Secondary | ICD-10-CM | POA: Diagnosis not present

## 2021-07-23 DIAGNOSIS — I10 Essential (primary) hypertension: Secondary | ICD-10-CM | POA: Diagnosis not present

## 2021-07-23 DIAGNOSIS — F4320 Adjustment disorder, unspecified: Secondary | ICD-10-CM | POA: Diagnosis not present

## 2021-07-23 DIAGNOSIS — R69 Illness, unspecified: Secondary | ICD-10-CM | POA: Diagnosis not present

## 2021-07-23 DIAGNOSIS — E1140 Type 2 diabetes mellitus with diabetic neuropathy, unspecified: Secondary | ICD-10-CM | POA: Diagnosis not present

## 2021-07-23 DIAGNOSIS — F69 Unspecified disorder of adult personality and behavior: Secondary | ICD-10-CM | POA: Diagnosis not present

## 2021-07-29 DIAGNOSIS — K529 Noninfective gastroenteritis and colitis, unspecified: Secondary | ICD-10-CM | POA: Diagnosis not present

## 2021-07-29 DIAGNOSIS — Z7289 Other problems related to lifestyle: Secondary | ICD-10-CM | POA: Diagnosis not present

## 2021-07-29 DIAGNOSIS — Z886 Allergy status to analgesic agent status: Secondary | ICD-10-CM | POA: Diagnosis not present

## 2021-07-29 DIAGNOSIS — I10 Essential (primary) hypertension: Secondary | ICD-10-CM | POA: Diagnosis not present

## 2021-07-29 DIAGNOSIS — Z91030 Bee allergy status: Secondary | ICD-10-CM | POA: Diagnosis not present

## 2021-07-29 DIAGNOSIS — Z20822 Contact with and (suspected) exposure to covid-19: Secondary | ICD-10-CM | POA: Diagnosis not present

## 2021-07-29 DIAGNOSIS — R197 Diarrhea, unspecified: Secondary | ICD-10-CM | POA: Diagnosis not present

## 2021-07-29 DIAGNOSIS — F39 Unspecified mood [affective] disorder: Secondary | ICD-10-CM | POA: Diagnosis not present

## 2021-07-29 DIAGNOSIS — R69 Illness, unspecified: Secondary | ICD-10-CM | POA: Diagnosis not present

## 2021-07-29 DIAGNOSIS — F1290 Cannabis use, unspecified, uncomplicated: Secondary | ICD-10-CM | POA: Diagnosis not present

## 2021-07-29 DIAGNOSIS — E119 Type 2 diabetes mellitus without complications: Secondary | ICD-10-CM | POA: Diagnosis not present

## 2021-07-29 DIAGNOSIS — R112 Nausea with vomiting, unspecified: Secondary | ICD-10-CM | POA: Diagnosis not present

## 2021-07-29 DIAGNOSIS — R110 Nausea: Secondary | ICD-10-CM | POA: Diagnosis not present

## 2021-07-29 DIAGNOSIS — F17210 Nicotine dependence, cigarettes, uncomplicated: Secondary | ICD-10-CM | POA: Diagnosis not present

## 2021-07-29 DIAGNOSIS — R45850 Homicidal ideations: Secondary | ICD-10-CM | POA: Diagnosis not present

## 2021-07-29 DIAGNOSIS — Z9081 Acquired absence of spleen: Secondary | ICD-10-CM | POA: Diagnosis not present

## 2021-07-29 DIAGNOSIS — Z91018 Allergy to other foods: Secondary | ICD-10-CM | POA: Diagnosis not present

## 2021-07-29 DIAGNOSIS — Z888 Allergy status to other drugs, medicaments and biological substances status: Secondary | ICD-10-CM | POA: Diagnosis not present

## 2021-07-29 DIAGNOSIS — R52 Pain, unspecified: Secondary | ICD-10-CM | POA: Diagnosis not present

## 2021-07-29 DIAGNOSIS — R45851 Suicidal ideations: Secondary | ICD-10-CM | POA: Diagnosis not present

## 2021-07-29 DIAGNOSIS — Z743 Need for continuous supervision: Secondary | ICD-10-CM | POA: Diagnosis not present

## 2021-07-29 DIAGNOSIS — R531 Weakness: Secondary | ICD-10-CM | POA: Diagnosis not present

## 2021-07-29 DIAGNOSIS — Z79899 Other long term (current) drug therapy: Secondary | ICD-10-CM | POA: Diagnosis not present

## 2021-08-02 DIAGNOSIS — R42 Dizziness and giddiness: Secondary | ICD-10-CM | POA: Diagnosis not present

## 2021-08-02 DIAGNOSIS — E1140 Type 2 diabetes mellitus with diabetic neuropathy, unspecified: Secondary | ICD-10-CM | POA: Diagnosis not present

## 2021-08-02 DIAGNOSIS — E538 Deficiency of other specified B group vitamins: Secondary | ICD-10-CM | POA: Diagnosis not present

## 2021-08-02 DIAGNOSIS — E785 Hyperlipidemia, unspecified: Secondary | ICD-10-CM | POA: Diagnosis not present

## 2021-08-02 DIAGNOSIS — Z62810 Personal history of physical and sexual abuse in childhood: Secondary | ICD-10-CM | POA: Diagnosis not present

## 2021-08-02 DIAGNOSIS — R69 Illness, unspecified: Secondary | ICD-10-CM | POA: Diagnosis not present

## 2021-08-02 DIAGNOSIS — Z9151 Personal history of suicidal behavior: Secondary | ICD-10-CM | POA: Diagnosis not present

## 2021-08-02 DIAGNOSIS — F1010 Alcohol abuse, uncomplicated: Secondary | ICD-10-CM | POA: Diagnosis not present

## 2021-08-02 DIAGNOSIS — R45851 Suicidal ideations: Secondary | ICD-10-CM | POA: Diagnosis not present

## 2021-08-02 DIAGNOSIS — Z7985 Long-term (current) use of injectable non-insulin antidiabetic drugs: Secondary | ICD-10-CM | POA: Diagnosis not present

## 2021-08-02 DIAGNOSIS — F3163 Bipolar disorder, current episode mixed, severe, without psychotic features: Secondary | ICD-10-CM | POA: Diagnosis not present

## 2021-08-02 DIAGNOSIS — Z9884 Bariatric surgery status: Secondary | ICD-10-CM | POA: Diagnosis not present

## 2021-08-02 DIAGNOSIS — G4700 Insomnia, unspecified: Secondary | ICD-10-CM | POA: Diagnosis not present

## 2021-08-02 DIAGNOSIS — F1210 Cannabis abuse, uncomplicated: Secondary | ICD-10-CM | POA: Diagnosis not present

## 2021-08-02 DIAGNOSIS — F332 Major depressive disorder, recurrent severe without psychotic features: Secondary | ICD-10-CM | POA: Diagnosis not present

## 2021-08-02 DIAGNOSIS — I10 Essential (primary) hypertension: Secondary | ICD-10-CM | POA: Diagnosis not present

## 2021-08-02 DIAGNOSIS — R45850 Homicidal ideations: Secondary | ICD-10-CM | POA: Diagnosis not present

## 2021-08-15 DIAGNOSIS — M5441 Lumbago with sciatica, right side: Secondary | ICD-10-CM | POA: Diagnosis not present

## 2021-08-15 DIAGNOSIS — Z79899 Other long term (current) drug therapy: Secondary | ICD-10-CM | POA: Diagnosis not present

## 2021-08-15 DIAGNOSIS — G8929 Other chronic pain: Secondary | ICD-10-CM | POA: Diagnosis not present

## 2021-08-15 DIAGNOSIS — E1165 Type 2 diabetes mellitus with hyperglycemia: Secondary | ICD-10-CM | POA: Diagnosis not present

## 2021-08-15 DIAGNOSIS — R635 Abnormal weight gain: Secondary | ICD-10-CM | POA: Diagnosis not present

## 2021-08-15 DIAGNOSIS — Z6829 Body mass index (BMI) 29.0-29.9, adult: Secondary | ICD-10-CM | POA: Diagnosis not present

## 2021-08-15 DIAGNOSIS — E7800 Pure hypercholesterolemia, unspecified: Secondary | ICD-10-CM | POA: Diagnosis not present

## 2021-08-15 DIAGNOSIS — I10 Essential (primary) hypertension: Secondary | ICD-10-CM | POA: Diagnosis not present

## 2021-08-15 DIAGNOSIS — G4700 Insomnia, unspecified: Secondary | ICD-10-CM | POA: Diagnosis not present

## 2021-08-22 DIAGNOSIS — Z79899 Other long term (current) drug therapy: Secondary | ICD-10-CM | POA: Diagnosis not present

## 2021-09-15 DIAGNOSIS — E1165 Type 2 diabetes mellitus with hyperglycemia: Secondary | ICD-10-CM | POA: Diagnosis not present

## 2021-09-15 DIAGNOSIS — Z79899 Other long term (current) drug therapy: Secondary | ICD-10-CM | POA: Diagnosis not present

## 2021-09-15 DIAGNOSIS — R635 Abnormal weight gain: Secondary | ICD-10-CM | POA: Diagnosis not present

## 2021-09-15 DIAGNOSIS — G8929 Other chronic pain: Secondary | ICD-10-CM | POA: Diagnosis not present

## 2021-09-15 DIAGNOSIS — I10 Essential (primary) hypertension: Secondary | ICD-10-CM | POA: Diagnosis not present

## 2021-09-15 DIAGNOSIS — G4700 Insomnia, unspecified: Secondary | ICD-10-CM | POA: Diagnosis not present

## 2021-09-15 DIAGNOSIS — M5441 Lumbago with sciatica, right side: Secondary | ICD-10-CM | POA: Diagnosis not present

## 2021-09-15 DIAGNOSIS — Z6829 Body mass index (BMI) 29.0-29.9, adult: Secondary | ICD-10-CM | POA: Diagnosis not present

## 2021-09-20 DIAGNOSIS — Z79899 Other long term (current) drug therapy: Secondary | ICD-10-CM | POA: Diagnosis not present

## 2021-09-26 DIAGNOSIS — E663 Overweight: Secondary | ICD-10-CM | POA: Diagnosis not present

## 2021-09-26 DIAGNOSIS — E1140 Type 2 diabetes mellitus with diabetic neuropathy, unspecified: Secondary | ICD-10-CM | POA: Diagnosis not present

## 2021-09-26 DIAGNOSIS — G4700 Insomnia, unspecified: Secondary | ICD-10-CM | POA: Diagnosis not present

## 2021-09-26 DIAGNOSIS — I10 Essential (primary) hypertension: Secondary | ICD-10-CM | POA: Diagnosis not present

## 2021-09-26 DIAGNOSIS — Z23 Encounter for immunization: Secondary | ICD-10-CM | POA: Diagnosis not present

## 2021-09-26 DIAGNOSIS — Z1159 Encounter for screening for other viral diseases: Secondary | ICD-10-CM | POA: Diagnosis not present

## 2021-09-26 DIAGNOSIS — R69 Illness, unspecified: Secondary | ICD-10-CM | POA: Diagnosis not present

## 2021-09-26 DIAGNOSIS — Z79899 Other long term (current) drug therapy: Secondary | ICD-10-CM | POA: Diagnosis not present

## 2021-09-26 DIAGNOSIS — Z0001 Encounter for general adult medical examination with abnormal findings: Secondary | ICD-10-CM | POA: Diagnosis not present

## 2021-09-26 DIAGNOSIS — G8929 Other chronic pain: Secondary | ICD-10-CM | POA: Diagnosis not present

## 2021-09-26 DIAGNOSIS — G5603 Carpal tunnel syndrome, bilateral upper limbs: Secondary | ICD-10-CM | POA: Diagnosis not present

## 2021-09-26 DIAGNOSIS — E559 Vitamin D deficiency, unspecified: Secondary | ICD-10-CM | POA: Diagnosis not present

## 2021-09-29 DIAGNOSIS — M792 Neuralgia and neuritis, unspecified: Secondary | ICD-10-CM | POA: Diagnosis not present

## 2021-09-29 DIAGNOSIS — M549 Dorsalgia, unspecified: Secondary | ICD-10-CM | POA: Diagnosis not present

## 2021-10-09 DIAGNOSIS — Z743 Need for continuous supervision: Secondary | ICD-10-CM | POA: Diagnosis not present

## 2021-10-09 DIAGNOSIS — M79602 Pain in left arm: Secondary | ICD-10-CM | POA: Diagnosis not present

## 2021-10-09 DIAGNOSIS — I10 Essential (primary) hypertension: Secondary | ICD-10-CM | POA: Diagnosis not present

## 2021-10-09 DIAGNOSIS — E1140 Type 2 diabetes mellitus with diabetic neuropathy, unspecified: Secondary | ICD-10-CM | POA: Diagnosis not present

## 2021-10-09 DIAGNOSIS — I498 Other specified cardiac arrhythmias: Secondary | ICD-10-CM | POA: Diagnosis not present

## 2021-10-09 DIAGNOSIS — R9431 Abnormal electrocardiogram [ECG] [EKG]: Secondary | ICD-10-CM | POA: Diagnosis not present

## 2021-10-09 DIAGNOSIS — R079 Chest pain, unspecified: Secondary | ICD-10-CM | POA: Diagnosis not present

## 2021-10-09 DIAGNOSIS — R69 Illness, unspecified: Secondary | ICD-10-CM | POA: Diagnosis not present

## 2021-10-09 DIAGNOSIS — R52 Pain, unspecified: Secondary | ICD-10-CM | POA: Diagnosis not present

## 2021-10-09 DIAGNOSIS — R0789 Other chest pain: Secondary | ICD-10-CM | POA: Diagnosis not present

## 2021-10-13 ENCOUNTER — Telehealth: Payer: Self-pay | Admitting: Licensed Clinical Social Worker

## 2021-10-13 NOTE — Patient Outreach (Signed)
  Care Coordination   10/13/2021 Name: Cheryl Stokes MRN: [id number] DOB: 1975-07-02   Care Coordination Outreach Attempts:  An unsuccessful telephone outreach was attempted today to offer the patient information about available care coordination services as a benefit of their health plan.   Follow Up Plan:  Additional outreach attempts will be made to offer the patient care coordination information and services.   Encounter Outcome:  No Answer  Care Coordination Interventions Activated:  No   Care Coordination Interventions:  No, not indicated    Christa See, MSW, [email or web address] Phone [phone] 3:56 PM

## 2021-10-16 DIAGNOSIS — Z6829 Body mass index (BMI) 29.0-29.9, adult: Secondary | ICD-10-CM | POA: Diagnosis not present

## 2021-10-16 DIAGNOSIS — Z79899 Other long term (current) drug therapy: Secondary | ICD-10-CM | POA: Diagnosis not present

## 2021-10-16 DIAGNOSIS — M5441 Lumbago with sciatica, right side: Secondary | ICD-10-CM | POA: Diagnosis not present

## 2021-10-16 DIAGNOSIS — I10 Essential (primary) hypertension: Secondary | ICD-10-CM | POA: Diagnosis not present

## 2021-10-16 DIAGNOSIS — G8929 Other chronic pain: Secondary | ICD-10-CM | POA: Diagnosis not present

## 2021-10-16 DIAGNOSIS — E1165 Type 2 diabetes mellitus with hyperglycemia: Secondary | ICD-10-CM | POA: Diagnosis not present

## 2021-10-16 DIAGNOSIS — R635 Abnormal weight gain: Secondary | ICD-10-CM | POA: Diagnosis not present

## 2021-10-16 DIAGNOSIS — G4700 Insomnia, unspecified: Secondary | ICD-10-CM | POA: Diagnosis not present

## 2021-10-20 DIAGNOSIS — Z79899 Other long term (current) drug therapy: Secondary | ICD-10-CM | POA: Diagnosis not present

## 2021-10-30 DIAGNOSIS — Z20822 Contact with and (suspected) exposure to covid-19: Secondary | ICD-10-CM | POA: Diagnosis not present

## 2021-11-15 DIAGNOSIS — E559 Vitamin D deficiency, unspecified: Secondary | ICD-10-CM | POA: Diagnosis not present

## 2021-11-15 DIAGNOSIS — I10 Essential (primary) hypertension: Secondary | ICD-10-CM | POA: Diagnosis not present

## 2021-11-15 DIAGNOSIS — Z0000 Encounter for general adult medical examination without abnormal findings: Secondary | ICD-10-CM | POA: Diagnosis not present

## 2021-11-15 DIAGNOSIS — E663 Overweight: Secondary | ICD-10-CM | POA: Diagnosis not present

## 2021-11-15 DIAGNOSIS — Z79899 Other long term (current) drug therapy: Secondary | ICD-10-CM | POA: Diagnosis not present

## 2021-11-15 DIAGNOSIS — Z6827 Body mass index (BMI) 27.0-27.9, adult: Secondary | ICD-10-CM | POA: Diagnosis not present

## 2021-11-15 DIAGNOSIS — E1140 Type 2 diabetes mellitus with diabetic neuropathy, unspecified: Secondary | ICD-10-CM | POA: Diagnosis not present

## 2021-11-16 ENCOUNTER — Other Ambulatory Visit: Payer: Self-pay | Admitting: Registered Nurse

## 2021-11-16 DIAGNOSIS — E1349 Other specified diabetes mellitus with other diabetic neurological complication: Secondary | ICD-10-CM

## 2021-11-21 DIAGNOSIS — R7309 Other abnormal glucose: Secondary | ICD-10-CM | POA: Diagnosis not present

## 2021-11-21 DIAGNOSIS — Z6829 Body mass index (BMI) 29.0-29.9, adult: Secondary | ICD-10-CM | POA: Diagnosis not present

## 2021-11-21 DIAGNOSIS — R030 Elevated blood-pressure reading, without diagnosis of hypertension: Secondary | ICD-10-CM | POA: Diagnosis not present

## 2021-11-21 DIAGNOSIS — G8929 Other chronic pain: Secondary | ICD-10-CM | POA: Diagnosis not present

## 2021-11-21 DIAGNOSIS — G4700 Insomnia, unspecified: Secondary | ICD-10-CM | POA: Diagnosis not present

## 2021-11-21 DIAGNOSIS — E1165 Type 2 diabetes mellitus with hyperglycemia: Secondary | ICD-10-CM | POA: Diagnosis not present

## 2021-11-21 DIAGNOSIS — Z6827 Body mass index (BMI) 27.0-27.9, adult: Secondary | ICD-10-CM | POA: Diagnosis not present

## 2021-11-21 DIAGNOSIS — R635 Abnormal weight gain: Secondary | ICD-10-CM | POA: Diagnosis not present

## 2021-11-21 DIAGNOSIS — M5441 Lumbago with sciatica, right side: Secondary | ICD-10-CM | POA: Diagnosis not present

## 2021-11-21 DIAGNOSIS — I10 Essential (primary) hypertension: Secondary | ICD-10-CM | POA: Diagnosis not present

## 2021-12-21 DIAGNOSIS — G8929 Other chronic pain: Secondary | ICD-10-CM | POA: Diagnosis not present

## 2021-12-21 DIAGNOSIS — I10 Essential (primary) hypertension: Secondary | ICD-10-CM | POA: Diagnosis not present

## 2021-12-21 DIAGNOSIS — M5441 Lumbago with sciatica, right side: Secondary | ICD-10-CM | POA: Diagnosis not present

## 2021-12-21 DIAGNOSIS — R030 Elevated blood-pressure reading, without diagnosis of hypertension: Secondary | ICD-10-CM | POA: Diagnosis not present

## 2021-12-21 DIAGNOSIS — R7309 Other abnormal glucose: Secondary | ICD-10-CM | POA: Diagnosis not present

## 2021-12-21 DIAGNOSIS — G4700 Insomnia, unspecified: Secondary | ICD-10-CM | POA: Diagnosis not present

## 2021-12-21 DIAGNOSIS — Z6827 Body mass index (BMI) 27.0-27.9, adult: Secondary | ICD-10-CM | POA: Diagnosis not present

## 2021-12-21 DIAGNOSIS — R635 Abnormal weight gain: Secondary | ICD-10-CM | POA: Diagnosis not present

## 2021-12-21 DIAGNOSIS — E1165 Type 2 diabetes mellitus with hyperglycemia: Secondary | ICD-10-CM | POA: Diagnosis not present

## 2021-12-21 DIAGNOSIS — Z6829 Body mass index (BMI) 29.0-29.9, adult: Secondary | ICD-10-CM | POA: Diagnosis not present

## 2021-12-28 DIAGNOSIS — G4489 Other headache syndrome: Secondary | ICD-10-CM | POA: Diagnosis not present

## 2022-01-19 DIAGNOSIS — G8929 Other chronic pain: Secondary | ICD-10-CM | POA: Diagnosis not present

## 2022-01-19 DIAGNOSIS — R635 Abnormal weight gain: Secondary | ICD-10-CM | POA: Diagnosis not present

## 2022-01-19 DIAGNOSIS — E559 Vitamin D deficiency, unspecified: Secondary | ICD-10-CM | POA: Diagnosis not present

## 2022-01-19 DIAGNOSIS — R030 Elevated blood-pressure reading, without diagnosis of hypertension: Secondary | ICD-10-CM | POA: Diagnosis not present

## 2022-01-19 DIAGNOSIS — R5383 Other fatigue: Secondary | ICD-10-CM | POA: Diagnosis not present

## 2022-01-19 DIAGNOSIS — M129 Arthropathy, unspecified: Secondary | ICD-10-CM | POA: Diagnosis not present

## 2022-01-19 DIAGNOSIS — R7309 Other abnormal glucose: Secondary | ICD-10-CM | POA: Diagnosis not present

## 2022-01-19 DIAGNOSIS — I10 Essential (primary) hypertension: Secondary | ICD-10-CM | POA: Diagnosis not present

## 2022-01-19 DIAGNOSIS — E7800 Pure hypercholesterolemia, unspecified: Secondary | ICD-10-CM | POA: Diagnosis not present

## 2022-01-19 DIAGNOSIS — E1165 Type 2 diabetes mellitus with hyperglycemia: Secondary | ICD-10-CM | POA: Diagnosis not present

## 2022-01-19 DIAGNOSIS — Z6826 Body mass index (BMI) 26.0-26.9, adult: Secondary | ICD-10-CM | POA: Diagnosis not present

## 2022-01-19 DIAGNOSIS — G4700 Insomnia, unspecified: Secondary | ICD-10-CM | POA: Diagnosis not present

## 2022-01-19 DIAGNOSIS — Z0000 Encounter for general adult medical examination without abnormal findings: Secondary | ICD-10-CM | POA: Diagnosis not present

## 2022-01-19 DIAGNOSIS — M5441 Lumbago with sciatica, right side: Secondary | ICD-10-CM | POA: Diagnosis not present

## 2022-01-19 DIAGNOSIS — Z6829 Body mass index (BMI) 29.0-29.9, adult: Secondary | ICD-10-CM | POA: Diagnosis not present

## 2022-01-19 DIAGNOSIS — Z79899 Other long term (current) drug therapy: Secondary | ICD-10-CM | POA: Diagnosis not present

## 2022-01-24 DIAGNOSIS — R32 Unspecified urinary incontinence: Secondary | ICD-10-CM | POA: Diagnosis not present

## 2022-01-25 DIAGNOSIS — E7800 Pure hypercholesterolemia, unspecified: Secondary | ICD-10-CM | POA: Diagnosis not present

## 2022-01-25 DIAGNOSIS — E559 Vitamin D deficiency, unspecified: Secondary | ICD-10-CM | POA: Diagnosis not present

## 2022-01-25 DIAGNOSIS — Z79899 Other long term (current) drug therapy: Secondary | ICD-10-CM | POA: Diagnosis not present

## 2022-01-25 DIAGNOSIS — M129 Arthropathy, unspecified: Secondary | ICD-10-CM | POA: Diagnosis not present

## 2022-01-25 DIAGNOSIS — R5383 Other fatigue: Secondary | ICD-10-CM | POA: Diagnosis not present

## 2022-01-25 DIAGNOSIS — E1165 Type 2 diabetes mellitus with hyperglycemia: Secondary | ICD-10-CM | POA: Diagnosis not present

## 2022-01-31 DIAGNOSIS — Z961 Presence of intraocular lens: Secondary | ICD-10-CM | POA: Diagnosis not present

## 2022-01-31 DIAGNOSIS — H524 Presbyopia: Secondary | ICD-10-CM | POA: Diagnosis not present

## 2022-01-31 DIAGNOSIS — H26493 Other secondary cataract, bilateral: Secondary | ICD-10-CM | POA: Diagnosis not present

## 2022-02-02 ENCOUNTER — Emergency Department (HOSPITAL_COMMUNITY): Payer: Medicare HMO

## 2022-02-02 ENCOUNTER — Other Ambulatory Visit: Payer: Self-pay

## 2022-02-02 ENCOUNTER — Emergency Department (HOSPITAL_COMMUNITY)
Admission: EM | Admit: 2022-02-02 | Discharge: 2022-02-02 | Disposition: A | Discharge status: Home / Self Care | Payer: Medicare HMO | Attending: Emergency Medicine | Admitting: Emergency Medicine

## 2022-02-02 DIAGNOSIS — R519 Headache, unspecified: Secondary | ICD-10-CM | POA: Diagnosis not present

## 2022-02-02 DIAGNOSIS — I10 Essential (primary) hypertension: Secondary | ICD-10-CM | POA: Diagnosis not present

## 2022-02-02 DIAGNOSIS — B9789 Other viral agents as the cause of diseases classified elsewhere: Secondary | ICD-10-CM | POA: Diagnosis not present

## 2022-02-02 DIAGNOSIS — R059 Cough, unspecified: Secondary | ICD-10-CM | POA: Diagnosis not present

## 2022-02-02 DIAGNOSIS — R110 Nausea: Secondary | ICD-10-CM | POA: Diagnosis not present

## 2022-02-02 DIAGNOSIS — E119 Type 2 diabetes mellitus without complications: Secondary | ICD-10-CM | POA: Diagnosis not present

## 2022-02-02 DIAGNOSIS — J069 Acute upper respiratory infection, unspecified: Secondary | ICD-10-CM | POA: Diagnosis not present

## 2022-02-02 DIAGNOSIS — Z20822 Contact with and (suspected) exposure to covid-19: Secondary | ICD-10-CM | POA: Diagnosis not present

## 2022-02-02 LAB — BASIC METABOLIC PANEL
Anion gap: 7 (ref 5–15)
BUN: 10 mg/dL (ref 6–20)
CO2: 24 mmol/L (ref 22–32)
Calcium: 8.5 mg/dL — ABNORMAL LOW (ref 8.9–10.3)
Chloride: 104 mmol/L (ref 98–111)
Creatinine, Ser: 0.66 mg/dL (ref 0.44–1.00)
GFR, Estimated: >60 mL/min (ref >60)
Glucose, Bld: 67 mg/dL — ABNORMAL LOW (ref 70–99)
Potassium: 4.1 mmol/L (ref 3.5–5.1)
Sodium: 135 mmol/L (ref 135–145)

## 2022-02-02 LAB — RESP PANEL BY RT-PCR (RSV, FLU A&B, COVID)  RVPGX2
Influenza A by PCR: NEGATIVE
Influenza B by PCR: NEGATIVE
Resp Syncytial Virus by PCR: NEGATIVE
SARS Coronavirus 2 by RT PCR: NEGATIVE

## 2022-02-02 LAB — CBC
HCT: 37.1 % (ref 36.0–46.0)
Hemoglobin: 12 g/dL (ref 12.0–15.0)
MCH: 27.3 pg (ref 26.0–34.0)
MCHC: 32.3 g/dL (ref 30.0–36.0)
MCV: 84.3 fL (ref 80.0–100.0)
Platelets: 424 10*3/uL — ABNORMAL HIGH (ref 150–400)
RBC: 4.4 MIL/uL (ref 3.87–5.11)
RDW: 15.1 % (ref 11.5–15.5)
WBC: 7.9 10*3/uL (ref 4.0–10.5)
nRBC: 0 % (ref 0.0–0.2)

## 2022-02-02 MED ORDER — ONDANSETRON 4 MG PO TBDP
4.0000 mg | ORAL_TABLET | Freq: Once | ORAL | Status: AC
  Administered 2022-02-02: 4 mg via ORAL
  Filled 2022-02-02: qty 1

## 2022-02-02 MED ORDER — OXYCODONE HCL 5 MG PO TABS
5.0000 mg | ORAL_TABLET | Freq: Once | ORAL | Status: AC
  Administered 2022-02-02: 5 mg via ORAL
  Filled 2022-02-02: qty 1

## 2022-02-02 MED ORDER — ACETAMINOPHEN 500 MG PO TABS
1000.0000 mg | ORAL_TABLET | Freq: Once | ORAL | Status: DC
  Filled 2022-02-02: qty 2

## 2022-02-02 NOTE — Discharge Instructions (Addendum)
You tested negative for COVID and flu today.  You can try Mucinex or TheraFlu for symptom relief. I recommend close follow-up with PCP for reevaluation.  Please do not hesitate to return to emergency department if worrisome signs symptoms we discussed become apparent.  

## 2022-02-02 NOTE — ED Provider Triage Note (Signed)
Emergency Medicine Provider Triage Evaluation Note  Cheryl Stokes , a 47 y.o. female  was evaluated in triage.  Pt complains of cough, congestion, diffuse body aches/joint pain, nausea with a couple episodes emesis.  Patient reports history symptoms for the past 2 to 3 days.  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  Denies fever, chills, night sweats, urinary symptoms, change in bowel habits.  Reports some chest pain with associated cough but not otherwise.  Review of Systems  Positive: See above Negative:   Physical Exam  BP (!) 141/96   Pulse 100   Temp 98.1 F (36.7 C) (Oral)   Resp 18   SpO2 99%  Gen:   Awake, no distress  Resp:  Normal effort  MSK:   Moves extremities without difficulty  Other:    Medical Decision Making  Medically screening exam initiated at 7:28 PM.  Appropriate orders placed.  Aaliayah BRYLYN NOVAKOVICH was informed that the remainder of the evaluation will be completed by another provider, this initial triage assessment does not replace that evaluation, and the importance of remaining in the ED until their evaluation is complete.     Wilnette Kales, [place] 02/02/22 1930

## 2022-02-02 NOTE — ED Provider Notes (Signed)
[facility] Provider Note   CSN: [id number] Arrival date & time: 02/02/22  1713     History  Chief Complaint  Patient presents with   Cough /Body Aches    Kenyada AMORIE RENTZ is a 47 y.o. female with a past medical history of diabetes, hypertension, splenectomy presenting today for evaluation of flulike symptoms.  Patient reports she started to have cough, runny nose, nasal congestion, headache, body aches for 2 days.  Patient states she was exposed to her neighbor who she thinks might be sick.  Patient is up-to-date on COVID and flu vaccination.  She endorses nausea and vomiting and difficulty holding food down.  No fever, chest pain, shortness of breath.  HPI  Past Medical History:  Diagnosis Date   Blood transfusion without reported diagnosis    Diabetes mellitus without complication ([place])    Hypertension    Sleep apnea    Past Surgical History:  Procedure Laterality Date   BARIATRIC SURGERY  03/2015   [facility]   CATARACT EXTRACTION W/PHACO Right 03/04/2020   Procedure: CATARACT EXTRACTION PHACO AND INTRAOCULAR LENS PLACEMENT ([place]);  Surgeon: Baruch Goldmann, MD;  Location: [facility];  Service: Ophthalmology;  Laterality: Right;  CDE 2.51   CATARACT EXTRACTION W/PHACO Left 03/18/2020   Procedure: CATARACT EXTRACTION PHACO AND INTRAOCULAR LENS PLACEMENT ([facility]);  Surgeon: Baruch Goldmann, MD;  Location: [facility];  Service: Ophthalmology;  Laterality: Left;  CDE: 4.94   SPLENECTOMY     TONSILLECTOMY       Home Medications Prior to Admission medications   Medication Sig Start Date End Date Taking? Authorizing Provider  Acetaminophen (MIDOL PO) Take 2 tablets by mouth every 6 (six) hours as needed (cramps).    [provider]  albuterol (VENTOLIN HFA) 108 (90 Base) MCG/ACT inhaler Inhale 2 puffs into the lungs every 6 (six) hours as needed for wheezing or shortness of breath.    [provider]  benzonatate (TESSALON) 200 MG  capsule Take 1 capsule (200 mg total) by mouth 3 (three) times daily as needed for cough. Swallow whole, do not chew 08/21/20   Triplett, Tammy, PA-C  chlorhexidine (PERIDEX) 0.12 % solution 15 mLs by Mouth Rinse route 2 (two) times daily. Patient not taking: Reported on 08/08/2020 02/17/20   [provider]  cholecalciferol (VITAMIN D) 25 MCG (1000 UNIT) tablet Take 1,000 Units by mouth daily.    [provider]  EPINEPHrine 0.3 mg/0.3 mL IJ SOAJ injection Inject 0.3 mg into the muscle once as needed (Allergies).  12/09/13   [provider]  ferrous sulfate 325 (65 FE) MG tablet Take 325 mg by mouth daily.    [provider]  hydrOXYzine (ATARAX/VISTARIL) 25 MG tablet Take 25 mg by mouth 3 (three) times daily as needed. 06/06/20   [provider]  ibuprofen (ADVIL,MOTRIN) 800 MG tablet Take 800 mg by mouth every 8 (eight) hours as needed for mild pain.     [provider]  ILEVRO 0.3 % ophthalmic suspension 1 drop See admin instructions. INSTILL ONE DROP EVERY DAY IN THE OPERATIVE EYE ONLY STARTING TWO DAYS BEFORE SURGERY AND CONTINUE AS DIRECTED Patient not taking: Reported on 08/08/2020 12/24/19   [provider]  labetalol (NORMODYNE) 100 MG tablet Take 100 mg by mouth 3 (three) times daily. 02/19/20   [provider]  moxifloxacin (VIGAMOX) 0.5 % ophthalmic solution 1 drop See admin instructions. INSTILL ONE DROP EVERY DAY IN THE OPERATIVE EYE ONLY STARTING  TWO DAYS BEFORE SURGERY AND CONTINUE AS DIRECTED Patient not taking: Reported on 08/08/2020 12/24/19   [provider]  Multiple Vitamins-Minerals (ZINC PO) Take 22 mg by mouth daily.    [provider]  omeprazole (PRILOSEC) 20 MG capsule Take 1 capsule (20 mg total) by mouth daily. 08/08/20 09/07/20  Sponseller, Eugene Garnet R, PA-C  ondansetron (ZOFRAN ODT) 4 MG disintegrating tablet 4mg  ODT q4 hours prn nausea/vomit Patient taking differently: Take by mouth every 8  (eight) hours as needed for vomiting or nausea. 02/11/16   Mesner, Corene Cornea, MD  Oxycodone HCl 10 MG TABS Take 10 mg by mouth 3 (three) times daily as needed (pain/pinched nerves). Patient not taking: Reported on 08/08/2020 06/21/14   [provider]  prednisoLONE acetate (PRED FORTE) 1 % ophthalmic suspension 1 drop See admin instructions. Instill 1 drop into the operative eye 3 times daily, starting 1 day after surgery Patient not taking: Reported on 08/08/2020 12/24/19   [provider]  tiZANidine (ZANAFLEX) 4 MG tablet Take 4 mg by mouth every 8 (eight) hours. 05/13/20   [provider]  traZODone (DESYREL) 100 MG tablet Take 100 mg by mouth at bedtime. 06/06/20   [provider]      Allergies    Bee venom, Tomato, Metformin and related, and Mobic [meloxicam]    Review of Systems   Review of Systems Negative except as per HPI.  Physical Exam Updated Vital Signs BP (!) 158/86 (BP Location: Right Arm)   Pulse 87   Temp 98.4 F (36.9 C) (Oral)   Resp 17   Ht 5\' 9"  (1.753 m)   Wt 77.6 kg   SpO2 98%   BMI 25.25 kg/m  Physical Exam Vitals and nursing note reviewed.  Constitutional:      Appearance: Normal appearance.  HENT:     Head: Normocephalic and atraumatic.     Mouth/Throat:     Mouth: Mucous membranes are moist.  Eyes:     General: No scleral icterus. Cardiovascular:     Rate and Rhythm: Normal rate and regular rhythm.     Pulses: Normal pulses.     Heart sounds: Normal heart sounds.  Pulmonary:     Effort: Pulmonary effort is normal.     Breath sounds: Normal breath sounds.  Abdominal:     General: Abdomen is flat.     Palpations: Abdomen is soft.     Tenderness: There is no abdominal tenderness.  Musculoskeletal:        General: No deformity.  Skin:    General: Skin is warm.     Findings: No rash.  Neurological:     General: No focal deficit present.     Mental Status: She is alert.  Psychiatric:        Mood and Affect: Mood  normal.     ED Results / Procedures / Treatments   Labs (all labs ordered are listed, but only abnormal results are displayed) Labs Reviewed  CBC - Abnormal; Notable for the following components:      Result Value   Platelets 424 (*)    All other components within normal limits  BASIC METABOLIC PANEL - Abnormal; Notable for the following components:   Glucose, Bld 67 (*)    Calcium 8.5 (*)    All other components within normal limits  RESP PANEL BY RT-PCR (RSV, FLU A&B, COVID)  [id number]    EKG None  Radiology DG Chest 2 View  Result Date: 02/02/2022 CLINICAL DATA:  Productive cough with nausea, nasal congestion, body aches, and headache for several days. EXAM: CHEST - 2 VIEW COMPARISON:  10/09/2021 FINDINGS: The heart size and mediastinal contours are within normal limits. Both lungs are clear. The visualized skeletal structures are unremarkable. IMPRESSION: No active cardiopulmonary disease. Electronically Signed   By: Burman Nieves M.D.   On: 02/02/2022 20:09    Procedures Procedures    Medications Ordered in ED Medications  ondansetron (ZOFRAN-ODT) disintegrating tablet 4 mg (4 mg Oral Given 02/02/22 2204)  oxyCODONE (Oxy IR/ROXICODONE) immediate release tablet 5 mg (5 mg Oral Given 02/02/22 2247)    ED Course/ Medical Decision Making/ A&P                             Medical Decision Making Amount and/or Complexity of Data Reviewed Labs: ordered.  Risk Prescription drug management.   This patient presents to the ED for sore throat, body aches, this involves an extensive number of treatment options, and is a complaint that carries with a high risk of complications and morbidity.  The differential diagnosis includes flu, COVID, RSV, strep, pharyngitis, bronchitis, pneumonia, infectious etiology.  This is not an exhaustive list.  Lab tests: I ordered and personally interpreted labs.  The pertinent results include: Viral panel negative. CBC CMP  unremarkable.  Imaging studies:  Problem list/ ED course/ Critical interventions/ Medical management: HPI: See above Vital signs within normal range and stable throughout visit. Laboratory/imaging studies significant for: See above. On physical examination, patient is afebrile and appears in no acute distress. This patient presents with symptoms suspicious for viral upper respiratory infection. Based on history and physical doubt sinusitis. COVID test was negative. Do not suspect underlying cardiopulmonary process. I considered, but think unlikely, pneumonia. Patient is nontoxic appearing and not in need of emergent medical intervention. Patient told to self isolate at home until symptoms subside for 72 hours.  Recommended patient to take TheraFlu or Mucinex for symptom relief.  Roxicodone ordered for pain as patient does not tolerate tylenol or nsaids due to allergic reaction. Reevaluation showed that patient improved. Follow-up with primary care physician for further evaluation and management.  Return to the ER if new or worsening symptoms. I have reviewed the patient home medicines and have made adjustments as needed.  Cardiac monitoring/EKG: The patient was maintained on a cardiac monitor.  I personally reviewed and interpreted the cardiac monitor which showed an underlying rhythm of: sinus rhythm.  Additional history obtained: External records from outside source obtained and reviewed including: Chart review including previous notes, labs, imaging.  Disposition Continued outpatient therapy. Follow-up with PCP recommended for reevaluation of symptoms. Treatment plan discussed with patient.  Pt acknowledged understanding was agreeable to the plan. Worrisome signs and symptoms were discussed with patient, and patient acknowledged understanding to return to the ED if they noticed these signs and symptoms. Patient was stable upon discharge.   This chart was dictated using voice recognition  software.  Despite best efforts to proofread,  errors can occur which can change the documentation meaning.          Final Clinical Impression(s) / ED Diagnoses Final diagnoses:  Viral upper respiratory infection    Rx / DC Orders ED Discharge Orders     None         Jeanelle Malling, [place] 02/03/22 1112    Benjiman Core, MD 02/03/22 1447

## 2022-02-02 NOTE — ED Triage Notes (Signed)
Patient reports productive cough with nausea , nasal congestion , body aches and headache for several days .

## 2022-02-07 ENCOUNTER — Telehealth: Payer: Self-pay

## 2022-02-07 NOTE — Telephone Encounter (Signed)
     Patient  visit on 1/20  at [facility]    Have you been able to follow up with your primary care physician? Yes   The patient was or was not able to obtain any needed medicine or equipment. Yes   Are there diet recommendations that you are having difficulty following? Na   Patient expresses understanding of discharge instructions and education provided has no other needs at this time.  Yes      [place], [facility], Care Management  [phone] [address], [place], [place] [address] Phone: [phone] Email: [email or web address]

## 2022-02-09 DIAGNOSIS — I10 Essential (primary) hypertension: Secondary | ICD-10-CM | POA: Diagnosis not present

## 2022-02-09 DIAGNOSIS — Z7985 Long-term (current) use of injectable non-insulin antidiabetic drugs: Secondary | ICD-10-CM | POA: Diagnosis not present

## 2022-02-09 DIAGNOSIS — E663 Overweight: Secondary | ICD-10-CM | POA: Diagnosis not present

## 2022-02-09 DIAGNOSIS — E785 Hyperlipidemia, unspecified: Secondary | ICD-10-CM | POA: Diagnosis not present

## 2022-02-09 DIAGNOSIS — Z6826 Body mass index (BMI) 26.0-26.9, adult: Secondary | ICD-10-CM | POA: Diagnosis not present

## 2022-02-09 DIAGNOSIS — E1169 Type 2 diabetes mellitus with other specified complication: Secondary | ICD-10-CM | POA: Diagnosis not present

## 2022-02-09 DIAGNOSIS — Z23 Encounter for immunization: Secondary | ICD-10-CM | POA: Diagnosis not present

## 2022-02-14 ENCOUNTER — Encounter: Payer: Self-pay | Admitting: *Deleted

## 2022-02-15 DIAGNOSIS — R32 Unspecified urinary incontinence: Secondary | ICD-10-CM | POA: Diagnosis not present

## 2022-02-19 DIAGNOSIS — M5441 Lumbago with sciatica, right side: Secondary | ICD-10-CM | POA: Diagnosis not present

## 2022-02-19 DIAGNOSIS — R110 Nausea: Secondary | ICD-10-CM | POA: Diagnosis not present

## 2022-02-19 DIAGNOSIS — Z6826 Body mass index (BMI) 26.0-26.9, adult: Secondary | ICD-10-CM | POA: Diagnosis not present

## 2022-02-19 DIAGNOSIS — I10 Essential (primary) hypertension: Secondary | ICD-10-CM | POA: Diagnosis not present

## 2022-02-19 DIAGNOSIS — G4700 Insomnia, unspecified: Secondary | ICD-10-CM | POA: Diagnosis not present

## 2022-02-19 DIAGNOSIS — Z9189 Other specified personal risk factors, not elsewhere classified: Secondary | ICD-10-CM | POA: Diagnosis not present

## 2022-02-19 DIAGNOSIS — G8929 Other chronic pain: Secondary | ICD-10-CM | POA: Diagnosis not present

## 2022-02-19 DIAGNOSIS — R7309 Other abnormal glucose: Secondary | ICD-10-CM | POA: Diagnosis not present

## 2022-02-19 DIAGNOSIS — Z6829 Body mass index (BMI) 29.0-29.9, adult: Secondary | ICD-10-CM | POA: Diagnosis not present

## 2022-02-19 DIAGNOSIS — R635 Abnormal weight gain: Secondary | ICD-10-CM | POA: Diagnosis not present

## 2022-02-19 DIAGNOSIS — E1165 Type 2 diabetes mellitus with hyperglycemia: Secondary | ICD-10-CM | POA: Diagnosis not present

## 2022-02-19 DIAGNOSIS — R030 Elevated blood-pressure reading, without diagnosis of hypertension: Secondary | ICD-10-CM | POA: Diagnosis not present

## 2022-03-16 DIAGNOSIS — R32 Unspecified urinary incontinence: Secondary | ICD-10-CM | POA: Diagnosis not present

## 2022-03-19 ENCOUNTER — Encounter: Payer: Medicare HMO | Admitting: Obstetrics and Gynecology

## 2022-03-19 DIAGNOSIS — R635 Abnormal weight gain: Secondary | ICD-10-CM | POA: Diagnosis not present

## 2022-03-19 DIAGNOSIS — Z6829 Body mass index (BMI) 29.0-29.9, adult: Secondary | ICD-10-CM | POA: Diagnosis not present

## 2022-03-19 DIAGNOSIS — I10 Essential (primary) hypertension: Secondary | ICD-10-CM | POA: Diagnosis not present

## 2022-03-19 DIAGNOSIS — R030 Elevated blood-pressure reading, without diagnosis of hypertension: Secondary | ICD-10-CM | POA: Diagnosis not present

## 2022-03-19 DIAGNOSIS — G8929 Other chronic pain: Secondary | ICD-10-CM | POA: Diagnosis not present

## 2022-03-19 DIAGNOSIS — E1165 Type 2 diabetes mellitus with hyperglycemia: Secondary | ICD-10-CM | POA: Diagnosis not present

## 2022-03-19 DIAGNOSIS — Z9189 Other specified personal risk factors, not elsewhere classified: Secondary | ICD-10-CM | POA: Diagnosis not present

## 2022-03-19 DIAGNOSIS — Z6826 Body mass index (BMI) 26.0-26.9, adult: Secondary | ICD-10-CM | POA: Diagnosis not present

## 2022-03-19 DIAGNOSIS — R110 Nausea: Secondary | ICD-10-CM | POA: Diagnosis not present

## 2022-03-19 DIAGNOSIS — M5441 Lumbago with sciatica, right side: Secondary | ICD-10-CM | POA: Diagnosis not present

## 2022-03-19 DIAGNOSIS — R7309 Other abnormal glucose: Secondary | ICD-10-CM | POA: Diagnosis not present

## 2022-03-19 DIAGNOSIS — G4700 Insomnia, unspecified: Secondary | ICD-10-CM | POA: Diagnosis not present

## 2022-04-04 ENCOUNTER — Telehealth: Payer: Self-pay | Admitting: *Deleted

## 2022-04-04 NOTE — Telephone Encounter (Signed)
Returned call from 2:22 PM. Left patient a message to call and schedule.

## 2022-04-16 DIAGNOSIS — R635 Abnormal weight gain: Secondary | ICD-10-CM | POA: Diagnosis not present

## 2022-04-16 DIAGNOSIS — R030 Elevated blood-pressure reading, without diagnosis of hypertension: Secondary | ICD-10-CM | POA: Diagnosis not present

## 2022-04-16 DIAGNOSIS — R7309 Other abnormal glucose: Secondary | ICD-10-CM | POA: Diagnosis not present

## 2022-04-16 DIAGNOSIS — G8929 Other chronic pain: Secondary | ICD-10-CM | POA: Diagnosis not present

## 2022-04-16 DIAGNOSIS — Z79899 Other long term (current) drug therapy: Secondary | ICD-10-CM | POA: Diagnosis not present

## 2022-04-16 DIAGNOSIS — Z6827 Body mass index (BMI) 27.0-27.9, adult: Secondary | ICD-10-CM | POA: Diagnosis not present

## 2022-04-16 DIAGNOSIS — M5441 Lumbago with sciatica, right side: Secondary | ICD-10-CM | POA: Diagnosis not present

## 2022-04-16 DIAGNOSIS — F1120 Opioid dependence, uncomplicated: Secondary | ICD-10-CM | POA: Diagnosis not present

## 2022-04-16 DIAGNOSIS — R110 Nausea: Secondary | ICD-10-CM | POA: Diagnosis not present

## 2022-04-16 DIAGNOSIS — Z6826 Body mass index (BMI) 26.0-26.9, adult: Secondary | ICD-10-CM | POA: Diagnosis not present

## 2022-04-16 DIAGNOSIS — G4700 Insomnia, unspecified: Secondary | ICD-10-CM | POA: Diagnosis not present

## 2022-04-16 DIAGNOSIS — E1165 Type 2 diabetes mellitus with hyperglycemia: Secondary | ICD-10-CM | POA: Diagnosis not present

## 2022-04-23 DIAGNOSIS — Z79899 Other long term (current) drug therapy: Secondary | ICD-10-CM | POA: Diagnosis not present

## 2022-04-29 DIAGNOSIS — R1111 Vomiting without nausea: Secondary | ICD-10-CM | POA: Diagnosis not present

## 2022-04-29 DIAGNOSIS — R1110 Vomiting, unspecified: Secondary | ICD-10-CM | POA: Diagnosis not present

## 2022-04-29 DIAGNOSIS — R69 Illness, unspecified: Secondary | ICD-10-CM | POA: Diagnosis not present

## 2022-04-29 DIAGNOSIS — R109 Unspecified abdominal pain: Secondary | ICD-10-CM | POA: Diagnosis not present

## 2022-04-29 DIAGNOSIS — R519 Headache, unspecified: Secondary | ICD-10-CM | POA: Diagnosis not present

## 2022-04-29 DIAGNOSIS — R079 Chest pain, unspecified: Secondary | ICD-10-CM | POA: Diagnosis not present

## 2022-04-29 DIAGNOSIS — N390 Urinary tract infection, site not specified: Secondary | ICD-10-CM | POA: Diagnosis not present

## 2022-04-29 DIAGNOSIS — I10 Essential (primary) hypertension: Secondary | ICD-10-CM | POA: Diagnosis not present

## 2022-04-29 DIAGNOSIS — R1084 Generalized abdominal pain: Secondary | ICD-10-CM | POA: Diagnosis not present

## 2022-04-29 DIAGNOSIS — G8929 Other chronic pain: Secondary | ICD-10-CM | POA: Diagnosis not present

## 2022-04-29 DIAGNOSIS — Z9081 Acquired absence of spleen: Secondary | ICD-10-CM | POA: Diagnosis not present

## 2022-04-29 DIAGNOSIS — R531 Weakness: Secondary | ICD-10-CM | POA: Diagnosis not present

## 2022-04-29 DIAGNOSIS — Z743 Need for continuous supervision: Secondary | ICD-10-CM | POA: Diagnosis not present

## 2022-04-29 DIAGNOSIS — R112 Nausea with vomiting, unspecified: Secondary | ICD-10-CM | POA: Diagnosis not present

## 2022-04-29 DIAGNOSIS — E119 Type 2 diabetes mellitus without complications: Secondary | ICD-10-CM | POA: Diagnosis not present

## 2022-04-29 DIAGNOSIS — E860 Dehydration: Secondary | ICD-10-CM | POA: Diagnosis not present

## 2022-05-12 IMAGING — DX DG CHEST 1V PORT
1 series · 1 of 1 positions shown · non-contrast
Comparison: Chest x-ray 08/08/2020.

CLINICAL DATA: 45-year-old female with history of cough. COVID
positive patient.

EXAM:
PORTABLE CHEST 1 VIEW

[chest ap]
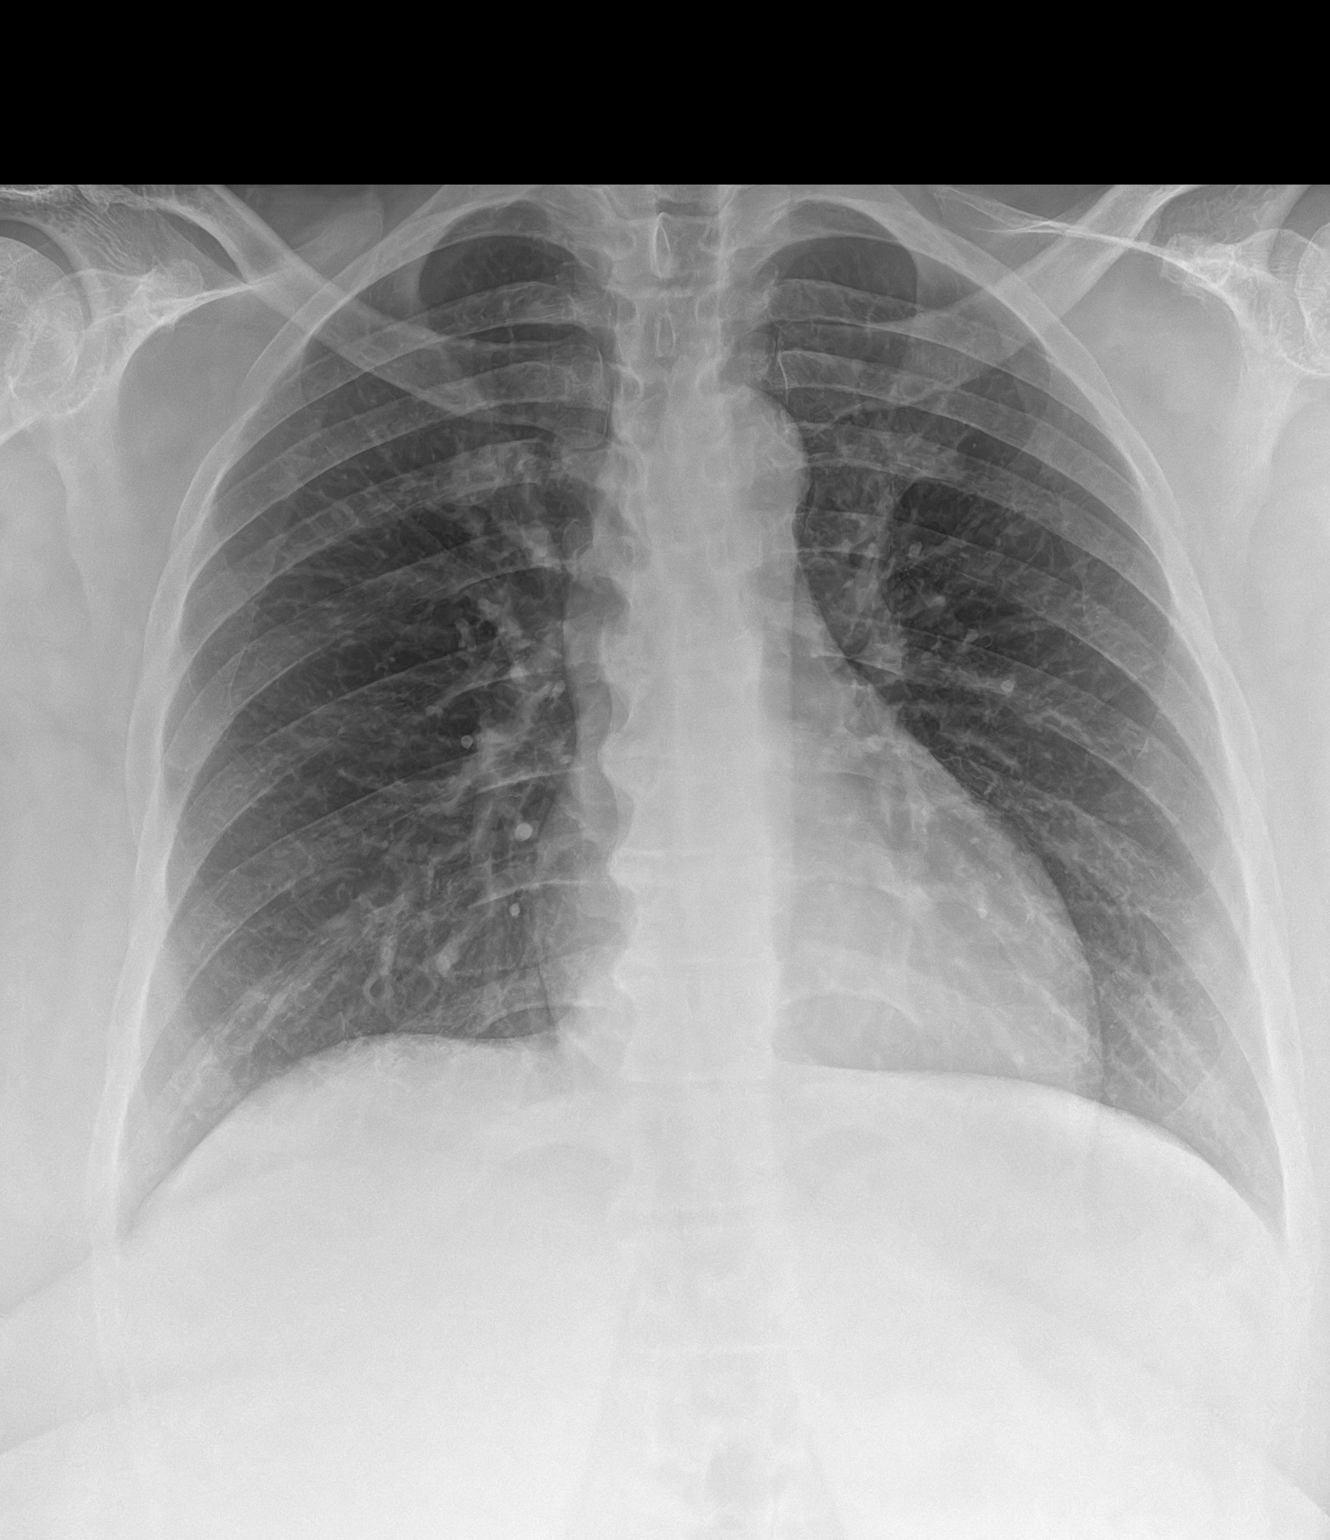

[1 of 1 positions shown; findings below may reference images not displayed]

FINDINGS: Lung volumes are normal. No consolidative airspace disease. No
pleural effusions. No pneumothorax. No pulmonary nodule or mass
noted. Pulmonary vasculature and the cardiomediastinal silhouette
are within normal limits.
IMPRESSION: No radiographic evidence of acute cardiopulmonary disease.

## 2022-05-18 DIAGNOSIS — G4700 Insomnia, unspecified: Secondary | ICD-10-CM | POA: Diagnosis not present

## 2022-05-18 DIAGNOSIS — M5441 Lumbago with sciatica, right side: Secondary | ICD-10-CM | POA: Diagnosis not present

## 2022-05-18 DIAGNOSIS — Z6827 Body mass index (BMI) 27.0-27.9, adult: Secondary | ICD-10-CM | POA: Diagnosis not present

## 2022-05-18 DIAGNOSIS — Z6829 Body mass index (BMI) 29.0-29.9, adult: Secondary | ICD-10-CM | POA: Diagnosis not present

## 2022-05-18 DIAGNOSIS — R110 Nausea: Secondary | ICD-10-CM | POA: Diagnosis not present

## 2022-05-18 DIAGNOSIS — R7309 Other abnormal glucose: Secondary | ICD-10-CM | POA: Diagnosis not present

## 2022-05-18 DIAGNOSIS — R635 Abnormal weight gain: Secondary | ICD-10-CM | POA: Diagnosis not present

## 2022-05-18 DIAGNOSIS — Z6826 Body mass index (BMI) 26.0-26.9, adult: Secondary | ICD-10-CM | POA: Diagnosis not present

## 2022-05-18 DIAGNOSIS — G8929 Other chronic pain: Secondary | ICD-10-CM | POA: Diagnosis not present

## 2022-05-18 DIAGNOSIS — R030 Elevated blood-pressure reading, without diagnosis of hypertension: Secondary | ICD-10-CM | POA: Diagnosis not present

## 2022-05-18 DIAGNOSIS — I10 Essential (primary) hypertension: Secondary | ICD-10-CM | POA: Diagnosis not present

## 2022-05-18 DIAGNOSIS — E1165 Type 2 diabetes mellitus with hyperglycemia: Secondary | ICD-10-CM | POA: Diagnosis not present

## 2022-06-02 DIAGNOSIS — R32 Unspecified urinary incontinence: Secondary | ICD-10-CM | POA: Diagnosis not present

## 2022-06-15 DIAGNOSIS — M79605 Pain in left leg: Secondary | ICD-10-CM | POA: Diagnosis not present

## 2022-06-15 DIAGNOSIS — Z7251 High risk heterosexual behavior: Secondary | ICD-10-CM | POA: Diagnosis not present

## 2022-06-15 DIAGNOSIS — Z131 Encounter for screening for diabetes mellitus: Secondary | ICD-10-CM | POA: Diagnosis not present

## 2022-06-15 DIAGNOSIS — R030 Elevated blood-pressure reading, without diagnosis of hypertension: Secondary | ICD-10-CM | POA: Diagnosis not present

## 2022-06-15 DIAGNOSIS — R5383 Other fatigue: Secondary | ICD-10-CM | POA: Diagnosis not present

## 2022-06-15 DIAGNOSIS — E7800 Pure hypercholesterolemia, unspecified: Secondary | ICD-10-CM | POA: Diagnosis not present

## 2022-06-15 DIAGNOSIS — Z6826 Body mass index (BMI) 26.0-26.9, adult: Secondary | ICD-10-CM | POA: Diagnosis not present

## 2022-06-15 DIAGNOSIS — M79604 Pain in right leg: Secondary | ICD-10-CM | POA: Diagnosis not present

## 2022-06-15 DIAGNOSIS — E559 Vitamin D deficiency, unspecified: Secondary | ICD-10-CM | POA: Diagnosis not present

## 2022-06-15 DIAGNOSIS — M5441 Lumbago with sciatica, right side: Secondary | ICD-10-CM | POA: Diagnosis not present

## 2022-06-15 DIAGNOSIS — M129 Arthropathy, unspecified: Secondary | ICD-10-CM | POA: Diagnosis not present

## 2022-06-15 DIAGNOSIS — R0989 Other specified symptoms and signs involving the circulatory and respiratory systems: Secondary | ICD-10-CM | POA: Diagnosis not present

## 2022-06-15 DIAGNOSIS — G8929 Other chronic pain: Secondary | ICD-10-CM | POA: Diagnosis not present

## 2022-06-15 DIAGNOSIS — R7309 Other abnormal glucose: Secondary | ICD-10-CM | POA: Diagnosis not present

## 2022-06-15 DIAGNOSIS — R635 Abnormal weight gain: Secondary | ICD-10-CM | POA: Diagnosis not present

## 2022-06-15 DIAGNOSIS — Z79899 Other long term (current) drug therapy: Secondary | ICD-10-CM | POA: Diagnosis not present

## 2022-06-23 DIAGNOSIS — R635 Abnormal weight gain: Secondary | ICD-10-CM | POA: Diagnosis not present

## 2022-06-23 DIAGNOSIS — E1165 Type 2 diabetes mellitus with hyperglycemia: Secondary | ICD-10-CM | POA: Diagnosis not present

## 2022-06-23 DIAGNOSIS — M5441 Lumbago with sciatica, right side: Secondary | ICD-10-CM | POA: Diagnosis not present

## 2022-06-23 DIAGNOSIS — Z9189 Other specified personal risk factors, not elsewhere classified: Secondary | ICD-10-CM | POA: Diagnosis not present

## 2022-06-23 DIAGNOSIS — G4700 Insomnia, unspecified: Secondary | ICD-10-CM | POA: Diagnosis not present

## 2022-06-23 DIAGNOSIS — R030 Elevated blood-pressure reading, without diagnosis of hypertension: Secondary | ICD-10-CM | POA: Diagnosis not present

## 2022-06-23 DIAGNOSIS — I10 Essential (primary) hypertension: Secondary | ICD-10-CM | POA: Diagnosis not present

## 2022-06-23 DIAGNOSIS — G8929 Other chronic pain: Secondary | ICD-10-CM | POA: Diagnosis not present

## 2022-06-23 DIAGNOSIS — R7309 Other abnormal glucose: Secondary | ICD-10-CM | POA: Diagnosis not present

## 2022-06-23 DIAGNOSIS — Z6826 Body mass index (BMI) 26.0-26.9, adult: Secondary | ICD-10-CM | POA: Diagnosis not present

## 2022-07-10 DIAGNOSIS — R5383 Other fatigue: Secondary | ICD-10-CM | POA: Diagnosis not present

## 2022-07-10 DIAGNOSIS — G8929 Other chronic pain: Secondary | ICD-10-CM | POA: Diagnosis not present

## 2022-07-10 DIAGNOSIS — R069 Unspecified abnormalities of breathing: Secondary | ICD-10-CM | POA: Diagnosis not present

## 2022-07-10 DIAGNOSIS — E860 Dehydration: Secondary | ICD-10-CM | POA: Diagnosis not present

## 2022-07-10 DIAGNOSIS — R531 Weakness: Secondary | ICD-10-CM | POA: Diagnosis not present

## 2022-07-10 DIAGNOSIS — Z79899 Other long term (current) drug therapy: Secondary | ICD-10-CM | POA: Diagnosis not present

## 2022-07-10 DIAGNOSIS — I10 Essential (primary) hypertension: Secondary | ICD-10-CM | POA: Diagnosis not present

## 2022-07-10 DIAGNOSIS — T676XXA Heat fatigue, transient, initial encounter: Secondary | ICD-10-CM | POA: Diagnosis not present

## 2022-07-10 DIAGNOSIS — R42 Dizziness and giddiness: Secondary | ICD-10-CM | POA: Diagnosis not present

## 2022-07-10 DIAGNOSIS — F17210 Nicotine dependence, cigarettes, uncomplicated: Secondary | ICD-10-CM | POA: Diagnosis not present

## 2022-07-10 DIAGNOSIS — E1140 Type 2 diabetes mellitus with diabetic neuropathy, unspecified: Secondary | ICD-10-CM | POA: Diagnosis not present

## 2022-07-10 DIAGNOSIS — Z743 Need for continuous supervision: Secondary | ICD-10-CM | POA: Diagnosis not present

## 2022-07-10 DIAGNOSIS — Z0000 Encounter for general adult medical examination without abnormal findings: Secondary | ICD-10-CM | POA: Diagnosis not present

## 2022-07-24 DIAGNOSIS — F1211 Cannabis abuse, in remission: Secondary | ICD-10-CM | POA: Diagnosis not present

## 2022-08-03 DIAGNOSIS — I10 Essential (primary) hypertension: Secondary | ICD-10-CM | POA: Diagnosis not present

## 2022-08-03 DIAGNOSIS — G4700 Insomnia, unspecified: Secondary | ICD-10-CM | POA: Diagnosis not present

## 2022-08-03 DIAGNOSIS — R030 Elevated blood-pressure reading, without diagnosis of hypertension: Secondary | ICD-10-CM | POA: Diagnosis not present

## 2022-08-03 DIAGNOSIS — M5441 Lumbago with sciatica, right side: Secondary | ICD-10-CM | POA: Diagnosis not present

## 2022-08-03 DIAGNOSIS — R7309 Other abnormal glucose: Secondary | ICD-10-CM | POA: Diagnosis not present

## 2022-08-03 DIAGNOSIS — R635 Abnormal weight gain: Secondary | ICD-10-CM | POA: Diagnosis not present

## 2022-08-03 DIAGNOSIS — E1165 Type 2 diabetes mellitus with hyperglycemia: Secondary | ICD-10-CM | POA: Diagnosis not present

## 2022-08-03 DIAGNOSIS — Z9189 Other specified personal risk factors, not elsewhere classified: Secondary | ICD-10-CM | POA: Diagnosis not present

## 2022-08-03 DIAGNOSIS — G8929 Other chronic pain: Secondary | ICD-10-CM | POA: Diagnosis not present

## 2022-08-27 ENCOUNTER — Encounter (HOSPITAL_COMMUNITY): Payer: Self-pay

## 2022-08-27 ENCOUNTER — Emergency Department (EMERGENCY_DEPARTMENT_HOSPITAL)
Admission: EM | Admit: 2022-08-27 | Discharge: 2022-08-29 | Disposition: A | Discharge status: Other Acute Inpatient Hospital | Payer: 59 | Source: Home / Self Care | Attending: Emergency Medicine | Admitting: Emergency Medicine

## 2022-08-27 ENCOUNTER — Other Ambulatory Visit: Payer: Self-pay

## 2022-08-27 DIAGNOSIS — E119 Type 2 diabetes mellitus without complications: Secondary | ICD-10-CM | POA: Insufficient documentation

## 2022-08-27 DIAGNOSIS — R45851 Suicidal ideations: Secondary | ICD-10-CM | POA: Insufficient documentation

## 2022-08-27 DIAGNOSIS — F319 Bipolar disorder, unspecified: Secondary | ICD-10-CM | POA: Diagnosis present

## 2022-08-27 DIAGNOSIS — R45850 Homicidal ideations: Secondary | ICD-10-CM

## 2022-08-27 DIAGNOSIS — F3163 Bipolar disorder, current episode mixed, severe, without psychotic features: Secondary | ICD-10-CM | POA: Diagnosis not present

## 2022-08-27 DIAGNOSIS — F419 Anxiety disorder, unspecified: Secondary | ICD-10-CM | POA: Diagnosis not present

## 2022-08-27 DIAGNOSIS — F3160 Bipolar disorder, current episode mixed, unspecified: Secondary | ICD-10-CM | POA: Diagnosis not present

## 2022-08-27 DIAGNOSIS — I10 Essential (primary) hypertension: Secondary | ICD-10-CM | POA: Insufficient documentation

## 2022-08-27 LAB — CBC WITH DIFFERENTIAL/PLATELET
Abs Immature Granulocytes: 0.02 10*3/uL (ref 0.00–0.07)
Basophils Absolute: 0.1 10*3/uL (ref 0.0–0.1)
Basophils Relative: 1 %
Eosinophils Absolute: 0.1 10*3/uL (ref 0.0–0.5)
Eosinophils Relative: 1 %
HCT: 36.9 % (ref 36.0–46.0)
Hemoglobin: 11.7 g/dL — ABNORMAL LOW (ref 12.0–15.0)
Immature Granulocytes: 0 %
Lymphocytes Relative: 47 %
Lymphs Abs: 4.1 10*3/uL — ABNORMAL HIGH (ref 0.7–4.0)
MCH: 27 pg (ref 26.0–34.0)
MCHC: 31.7 g/dL (ref 30.0–36.0)
MCV: 85 fL (ref 80.0–100.0)
Monocytes Absolute: 0.8 10*3/uL (ref 0.1–1.0)
Monocytes Relative: 9 %
Neutro Abs: 3.7 10*3/uL (ref 1.7–7.7)
Neutrophils Relative %: 42 %
Platelets: 432 10*3/uL — ABNORMAL HIGH (ref 150–400)
RBC: 4.34 MIL/uL (ref 3.87–5.11)
RDW: 15 % (ref 11.5–15.5)
WBC: 8.8 10*3/uL (ref 4.0–10.5)
nRBC: 0 % (ref 0.0–0.2)

## 2022-08-27 LAB — RAPID URINE DRUG SCREEN, HOSP PERFORMED
Amphetamines: NOT DETECTED
Barbiturates: NOT DETECTED
Benzodiazepines: POSITIVE — AB
Cocaine: NOT DETECTED
Opiates: NOT DETECTED
Tetrahydrocannabinol: POSITIVE — AB

## 2022-08-27 LAB — COMPREHENSIVE METABOLIC PANEL
ALT: 12 U/L (ref 0–44)
AST: 17 U/L (ref 15–41)
Albumin: 3.4 g/dL — ABNORMAL LOW (ref 3.5–5.0)
Alkaline Phosphatase: 62 U/L (ref 38–126)
Anion gap: 5 (ref 5–15)
BUN: 18 mg/dL (ref 6–20)
CO2: 27 mmol/L (ref 22–32)
Calcium: 8.5 mg/dL — ABNORMAL LOW (ref 8.9–10.3)
Chloride: 105 mmol/L (ref 98–111)
Creatinine, Ser: 0.73 mg/dL (ref 0.44–1.00)
GFR, Estimated: >60 mL/min (ref >60)
Glucose, Bld: 74 mg/dL (ref 70–99)
Potassium: 3.6 mmol/L (ref 3.5–5.1)
Sodium: 137 mmol/L (ref 135–145)
Total Bilirubin: 0.3 mg/dL (ref 0.3–1.2)
Total Protein: 7.2 g/dL (ref 6.5–8.1)

## 2022-08-27 LAB — ETHANOL: Alcohol, Ethyl (B): <10 mg/dL (ref <10)

## 2022-08-27 LAB — SALICYLATE LEVEL: Salicylate Lvl: <7 mg/dL — ABNORMAL LOW (ref 7.0–30.0)

## 2022-08-27 LAB — POC URINE PREG, ED: Preg Test, Ur: NEGATIVE

## 2022-08-27 LAB — ACETAMINOPHEN LEVEL: Acetaminophen (Tylenol), Serum: <10 ug/mL — ABNORMAL LOW (ref 10–30)

## 2022-08-27 MED ORDER — DIAZEPAM 5 MG/ML IJ SOLN: 10.0000 mg | Freq: Four times a day (QID) | INTRAMUSCULAR | Status: DC | PRN

## 2022-08-27 MED ORDER — ZIPRASIDONE MESYLATE 20 MG IM SOLR: 10.0000 mg | Freq: Four times a day (QID) | INTRAMUSCULAR | Status: DC | PRN

## 2022-08-27 MED ORDER — DIPHENHYDRAMINE HCL 50 MG/ML IJ SOLN: 50.0000 mg | Freq: Four times a day (QID) | INTRAMUSCULAR | Status: DC | PRN

## 2022-08-27 MED ORDER — LURASIDONE HCL 40 MG PO TABS
40.0000 mg | ORAL_TABLET | Freq: Every day | ORAL | Status: DC
  Administered 2022-08-27 – 2022-08-28 (×2): 40 mg via ORAL
  Filled 2022-08-27 (×2): qty 1

## 2022-08-27 MED ORDER — IBUPROFEN 400 MG PO TABS
600.0000 mg | ORAL_TABLET | Freq: Once | ORAL | Status: AC
  Administered 2022-08-27: 600 mg via ORAL
  Filled 2022-08-27: qty 2

## 2022-08-27 MED ORDER — TRAZODONE HCL 50 MG PO TABS
50.0000 mg | ORAL_TABLET | Freq: Every day | ORAL | Status: DC
  Administered 2022-08-27 – 2022-08-28 (×2): 50 mg via ORAL
  Filled 2022-08-27 (×2): qty 1

## 2022-08-27 MED ORDER — ACETAMINOPHEN 325 MG PO TABS: 650.0000 mg | ORAL_TABLET | Freq: Once | ORAL | Status: DC

## 2022-08-27 NOTE — ED Notes (Signed)
Pt asked for a snack and a drink

## 2022-08-27 NOTE — ED Notes (Signed)
Pt currently on the telephone with a family member. Calm and in NAD.

## 2022-08-27 NOTE — ED Notes (Signed)
Removed the phone from the patient's room.

## 2022-08-27 NOTE — ED Notes (Signed)
Pharmacy Tech in to review home medications with patient.

## 2022-08-27 NOTE — ED Provider Notes (Signed)
[place] EMERGENCY DEPARTMENT AT [facility] Provider Note   CSN: [id number] Arrival date & time: 08/27/22  1301     History  Chief Complaint  Patient presents with   Homicidal    Cheryl Stokes is a 47 y.o. female history of hypertension presents today for evaluation of suicidal thoughts and homicidal thoughts.  Patient reports that she has been having a conflict with her neighbors and her husband took their size so she is wanting to kill her neighbors and possibly her husband.  States she also has homicidal thoughts, not having a specific plan but she states she would jump into traffic or take a whole bunch of pills.  She denies any visual or auditory hallucination today.  Though she states that she has seen people who passed away in the past.  States she does use CBD gummies.  Denies any alcohol consumption.  She denies any fever, nausea, vomiting, chest pain, shortness of breath, bowel change, urinary symptoms.  HPI    Past Medical History:  Diagnosis Date   Blood transfusion without reported diagnosis    Diabetes mellitus without complication (HCC)    Hypertension    Sleep apnea    Past Surgical History:  Procedure Laterality Date   BARIATRIC SURGERY  03/2015   [facility]   CATARACT EXTRACTION W/PHACO Right 03/04/2020   Procedure: CATARACT EXTRACTION PHACO AND INTRAOCULAR LENS PLACEMENT (IOC);  Surgeon: Fabio Pierce, MD;  Location: [facility];  Service: Ophthalmology;  Laterality: Right;  CDE 2.51   CATARACT EXTRACTION W/PHACO Left 03/18/2020   Procedure: CATARACT EXTRACTION PHACO AND INTRAOCULAR LENS PLACEMENT (IOC);  Surgeon: Fabio Pierce, MD;  Location: [facility];  Service: Ophthalmology;  Laterality: Left;  CDE: 4.94   SPLENECTOMY     TONSILLECTOMY       Home Medications Prior to Admission medications   Medication Sig Start Date End Date Taking? Authorizing Provider  Acetaminophen (MIDOL PO) Take 2 tablets by mouth every 6 (six) hours as needed (cramps).     [provider]  albuterol (VENTOLIN HFA) 108 (90 Base) MCG/ACT inhaler Inhale 2 puffs into the lungs every 6 (six) hours as needed for wheezing or shortness of breath.    [provider]  benzonatate (TESSALON) 200 MG capsule Take 1 capsule (200 mg total) by mouth 3 (three) times daily as needed for cough. Swallow whole, do not chew 08/21/20   Triplett, Tammy, PA-C  chlorhexidine (PERIDEX) 0.12 % solution 15 mLs by Mouth Rinse route 2 (two) times daily. Patient not taking: Reported on 08/08/2020 02/17/20   [provider]  cholecalciferol (VITAMIN D) 25 MCG (1000 UNIT) tablet Take 1,000 Units by mouth daily.    [provider]  EPINEPHrine 0.3 mg/0.3 mL IJ SOAJ injection Inject 0.3 mg into the muscle once as needed (Allergies).  12/09/13   [provider]  ferrous sulfate 325 (65 FE) MG tablet Take 325 mg by mouth daily.    [provider]  hydrOXYzine (ATARAX/VISTARIL) 25 MG tablet Take 25 mg by mouth 3 (three) times daily as needed. 06/06/20   [provider]  ibuprofen (ADVIL,MOTRIN) 800 MG tablet Take 800 mg by mouth every 8 (eight) hours as needed for mild pain.     [provider]  ILEVRO 0.3 % ophthalmic suspension 1 drop See admin instructions. INSTILL ONE DROP EVERY DAY IN THE OPERATIVE EYE ONLY STARTING TWO DAYS BEFORE SURGERY AND CONTINUE AS DIRECTED Patient not taking: Reported on 08/08/2020 12/24/19   [provider]  labetalol (NORMODYNE) 100 MG tablet Take 100 mg by mouth 3 (three) times daily. 02/19/20   [provider]  moxifloxacin (VIGAMOX) 0.5 % ophthalmic solution 1 drop See admin instructions. INSTILL ONE DROP EVERY DAY IN THE OPERATIVE EYE ONLY STARTING TWO DAYS BEFORE SURGERY AND CONTINUE AS DIRECTED Patient not taking: Reported on 08/08/2020 12/24/19   [provider]  Multiple Vitamins-Minerals (ZINC PO) Take 22 mg by mouth daily.    [provider]  omeprazole (PRILOSEC) 20 MG  capsule Take 1 capsule (20 mg total) by mouth daily. 08/08/20 09/07/20  Sponseller, Lupe Carney R, PA-C  ondansetron (ZOFRAN ODT) 4 MG disintegrating tablet 4mg  ODT q4 hours prn nausea/vomit Patient taking differently: Take by mouth every 8 (eight) hours as needed for vomiting or nausea. 02/11/16   Mesner, Barbara Cower, MD  Oxycodone HCl 10 MG TABS Take 10 mg by mouth 3 (three) times daily as needed (pain/pinched nerves). Patient not taking: Reported on 08/08/2020 06/21/14   [provider]  prednisoLONE acetate (PRED FORTE) 1 % ophthalmic suspension 1 drop See admin instructions. Instill 1 drop into the operative eye 3 times daily, starting 1 day after surgery Patient not taking: Reported on 08/08/2020 12/24/19   [provider]  tiZANidine (ZANAFLEX) 4 MG tablet Take 4 mg by mouth every 8 (eight) hours. 05/13/20   [provider]  traZODone (DESYREL) 100 MG tablet Take 100 mg by mouth at bedtime. 06/06/20   [provider]      Allergies    Bee venom, Tomato, Metformin and related, and Mobic [meloxicam]    Review of Systems   Review of Systems Negative except as per HPI.  Physical Exam Updated Vital Signs BP 128/77   Pulse (!) 102   Temp 98.3 F (36.8 C) (Oral)   Ht 5\' 9"  (1.753 m)   Wt 72.1 kg   LMP 08/14/2022   SpO2 96%   BMI 23.48 kg/m  Physical Exam Vitals and nursing note reviewed.  Constitutional:      Appearance: Normal appearance.  HENT:     Head: Normocephalic and atraumatic.     Mouth/Throat:     Mouth: Mucous membranes are moist.  Eyes:     General: No scleral icterus. Cardiovascular:     Rate and Rhythm: Normal rate and regular rhythm.     Pulses: Normal pulses.     Heart sounds: Normal heart sounds.  Pulmonary:     Effort: Pulmonary effort is normal.     Breath sounds: Normal breath sounds.  Abdominal:     General: Abdomen is flat.     Palpations: Abdomen is soft.     Tenderness: There is no abdominal tenderness.  Musculoskeletal:         General: No deformity.  Skin:    General: Skin is warm.     Findings: No rash.  Neurological:     General: No focal deficit present.     Mental Status: She is alert.  Psychiatric:        Mood and Affect: Mood normal.     Comments: Speech is somewhat pressured but patient is cooperative and answers my questions appropriately.     ED Results / Procedures / Treatments   Labs (all labs ordered are listed, but only abnormal results are displayed) Labs Reviewed  RAPID URINE DRUG SCREEN, HOSP PERFORMED - Abnormal; Notable for the following components:      Result Value   Benzodiazepines POSITIVE (*)    Tetrahydrocannabinol  POSITIVE (*)    All other components within normal limits  COMPREHENSIVE METABOLIC PANEL - Abnormal; Notable for the following components:   Calcium 8.5 (*)    Albumin 3.4 (*)    All other components within normal limits  CBC WITH DIFFERENTIAL/PLATELET - Abnormal; Notable for the following components:   Hemoglobin 11.7 (*)    Platelets 432 (*)    Lymphs Abs 4.1 (*)    All other components within normal limits  SALICYLATE LEVEL - Abnormal; Notable for the following components:   Salicylate Lvl <7.0 (*)    All other components within normal limits  ACETAMINOPHEN LEVEL - Abnormal; Notable for the following components:   Acetaminophen (Tylenol), Serum <10 (*)    All other components within normal limits  ETHANOL  POC URINE PREG, ED    EKG EKG Interpretation Date/Time:  Monday August 27 2022 16:31:42 EDT Ventricular Rate:  73 PR Interval:  144 QRS Duration:  62 QT Interval:  394 QTC Calculation: 434 R Axis:   58  Text Interpretation: Normal sinus rhythm Low voltage QRS Confirmed by Cathren Laine ([id number]) on 08/27/2022 4:46:06 PM  Radiology No results found.  Procedures Procedures    Medications Ordered in ED Medications  acetaminophen (TYLENOL) tablet 650 mg (650 mg Oral Patient Refused/Not Given 08/27/22 1730)  ibuprofen (ADVIL) tablet 600 mg (has  no administration in time range)    ED Course/ Medical Decision Making/ A&P                                 Medical Decision Making Amount and/or Complexity of Data Reviewed Labs: ordered.  Risk OTC drugs. Prescription drug management.   This patient presents to the ED for suicidal, homicidal thoughts, this involves an extensive number of treatment options, and is a complaint that carries with a high risk of complications and morbidity.  The differential diagnosis includes SI/HI, psychosis, depression, anxiety, alcohol intoxication, overdose, illicit drug use, electrolyte abnormalities, infectious etiology.  This is not an exhaustive list.  Lab tests: I ordered and personally interpreted labs.  The pertinent results include: WBC unremarkable. Hbg unremarkable. Platelets unremarkable. Electrolytes unremarkable. BUN, creatinine unremarkable.  Acetaminophen level, salicylate level unremarkable.  Ethanol was normal.  Pregnancy test is negative.  Problem list/ ED course/ Critical interventions/ Medical management: HPI: See above Vital signs within normal range and stable throughout visit. Laboratory/imaging studies significant for: See above. On physical examination, patient is afebrile and appears in no acute distress.  Speech is somewhat pressured but patient remains cooperative and answers all my questions appropriately.  Patient is here after having homicidal thoughts, states she wants to kill her neighbors and her husband.  She also has suicidal thoughts but does not have any specific plan. States she may want to jump into traffic or take a whole bunch of pills. Patient is IVCed due to leaving the ER a couple times today before being seen by EDP, which pose risk to herself and other people. Labs including CBC, CMP, ethanol, salicylate level, significant level with no significant findings.  UDS is positive for benzo and THC.  Patient is medically cleared.  TTS consult placed.  Disposition  depends on TTS consult. I have reviewed the patient home medicines and have made adjustments as needed.  Cardiac monitoring/EKG: The patient was maintained on a cardiac monitor.  I personally reviewed and interpreted the cardiac monitor which showed an underlying rhythm of: sinus rhythm.  Additional history obtained: External records from outside source obtained and reviewed including: Chart review including previous notes, labs, imaging.  Consultations obtained:  Disposition Admitted to psychiatric care. This chart was dictated using voice recognition software.  Despite best efforts to proofread,  errors can occur which can change the documentation meaning.          Final Clinical Impression(s) / ED Diagnoses Final diagnoses:  Suicidal thoughts  Homicidal thoughts    Rx / DC Orders ED Discharge Orders     None         Jeanelle Malling, [place] 08/27/22 1801    Cathren Laine, MD 08/28/22 [phone]

## 2022-08-27 NOTE — ED Notes (Signed)
Faxed IVC paperwork to Magistrate Office 

## 2022-08-27 NOTE — ED Notes (Signed)
Pt went to bathroom

## 2022-08-27 NOTE — ED Notes (Signed)
Pt returned to ED. PA made aware and to bedside to assess pt.

## 2022-08-27 NOTE — ED Notes (Signed)
Pt dressed out into burgundy scrubs. All belongings placed in a bag and locked up. Pt wanded by security

## 2022-08-27 NOTE — ED Triage Notes (Signed)
Pt reports she has been having a conflict with her neighbors and her husband took their side so she is wanting to kill her neighbors and possibly her husband.  

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
## 2022-08-27 NOTE — ED Notes (Signed)
Pt c/o back pain. PA made aware

## 2022-08-27 NOTE — ED Notes (Signed)
Pt begins TTS interview in Room 16.

## 2022-08-27 NOTE — ED Notes (Signed)
Pt seen walking out of department. Attempted to have pt return to bed but pt kept walking.

## 2022-08-27 NOTE — BH Assessment (Addendum)
This consult has been deferred to [facility]. Dr. Elesa Hacker, MD is scheduled to see pt at 10:00 pm.

## 2022-08-28 MED ORDER — OXYCODONE HCL 5 MG PO TABS
5.0000 mg | ORAL_TABLET | Freq: Once | ORAL | Status: AC
  Administered 2022-08-28: 5 mg via ORAL
  Filled 2022-08-28: qty 1

## 2022-08-28 MED ORDER — LORAZEPAM 1 MG PO TABS
1.0000 mg | ORAL_TABLET | Freq: Two times a day (BID) | ORAL | Status: DC
  Administered 2022-08-28 – 2022-08-29 (×2): 1 mg via ORAL
  Filled 2022-08-28 (×2): qty 1

## 2022-08-28 MED ORDER — ADULT MULTIVITAMIN W/MINERALS CH
1.0000 | ORAL_TABLET | Freq: Every day | ORAL | Status: DC
  Administered 2022-08-28 – 2022-08-29 (×2): 1 via ORAL
  Filled 2022-08-28 (×2): qty 1

## 2022-08-28 MED ORDER — OXYCODONE HCL 5 MG PO TABS
5.0000 mg | ORAL_TABLET | Freq: Four times a day (QID) | ORAL | Status: DC | PRN
  Administered 2022-08-28 – 2022-08-29 (×2): 5 mg via ORAL
  Filled 2022-08-28 (×2): qty 1

## 2022-08-28 NOTE — ED Notes (Signed)
 Pt took a shower and brushed teeth.

## 2022-08-28 NOTE — Progress Notes (Signed)
Pt was accepted to [facility] TOMORROW 08/29/2022 PENDING Discharges  -[facility] care team member Orene Desanctis has agreed to fax IVC paperwork to [facility] at [phone]/ [phone].  Pt meets inpatient criteria per Ezekiel Slocumb, MD Telepsychiatry Consult Service  Attending Physician will be Dr. Phineas Inches, MD   Report can be called to: -Adult unit: [phone]  Pt can arrive after:[facility] will coordinate with care team.  Care Team notified: Day [facility] Danika Riley,RN, Night [facility] Kim Brooks,RN [place], [place],Counselor, [place], Abigail [place], [place] 08/28/2022 @ 10:25 PM

## 2022-08-28 NOTE — ED Provider Notes (Signed)
Emergency Medicine Observation Re-evaluation Note  Cheryl Stokes is a 47 y.o. female, seen on rounds today.  Pt initially presented to the ED for complaints of Homicidal Currently, the patient is sleeping.  Physical Exam  BP (!) 150/78 (BP Location: Left Arm)   Pulse 87   Temp 97.8 F (36.6 C) (Oral)   Resp 17   Ht 5\' 9"  (1.753 m)   Wt 72.1 kg   LMP 08/14/2022   SpO2 98%   BMI 23.48 kg/m  Physical Exam General: Sleeping Cardiac: Extremities well-perfused Lungs: Breathing is unlabored Psych: Deferred  ED Course / MDM  EKG:EKG Interpretation Date/Time:  Monday August 27 2022 16:31:42 EDT Ventricular Rate:  73 PR Interval:  144 QRS Duration:  62 QT Interval:  394 QTC Calculation: 434 R Axis:   58  Text Interpretation: Normal sinus rhythm Low voltage QRS Confirmed by Cathren Laine ([id number]) on 08/27/2022 4:46:06 PM  I have reviewed the labs performed to date as well as medications administered while in observation.  Recent changes in the last 24 hours include presentation to the emergency department yesterday for homicidal ideation.  She has been medically cleared.  She has been evaluated by TTS, who recommends inpatient psychiatric admission.  Plan  Current plan is for psychiatric admission.    Gloris Manchester, MD 08/28/22 [phone]

## 2022-08-28 NOTE — ED Notes (Signed)
Paperwork faxed, to [phone]

## 2022-08-28 NOTE — ED Notes (Signed)
Pt received lunch tray 

## 2022-08-28 NOTE — ED Notes (Signed)
Attempting fax no. 2.

## 2022-08-28 NOTE — ED Notes (Signed)
Fax: Busy status.

## 2022-08-28 NOTE — ED Notes (Signed)
Faxing IVC paperwork over to [facility] per request.

## 2022-08-28 NOTE — ED Notes (Signed)
Pt received dinner tray.

## 2022-08-28 NOTE — ED Notes (Signed)
Confirmed from [facility] that they have received IVC paperwork.

## 2022-08-28 NOTE — ED Notes (Signed)
Pt went to bathroom

## 2022-08-29 ENCOUNTER — Encounter (HOSPITAL_COMMUNITY): Payer: Self-pay | Admitting: Psychiatry

## 2022-08-29 ENCOUNTER — Other Ambulatory Visit: Payer: Self-pay

## 2022-08-29 ENCOUNTER — Inpatient Hospital Stay (HOSPITAL_COMMUNITY)
Admission: AD | Admit: 2022-08-29 | Discharge: 2022-09-05 | DRG: 885 | Disposition: A | Discharge status: Home / Self Care | Payer: 59 | Source: Intra-hospital | Attending: Psychiatry | Admitting: Psychiatry

## 2022-08-29 DIAGNOSIS — Z23 Encounter for immunization: Secondary | ICD-10-CM

## 2022-08-29 DIAGNOSIS — Z9151 Personal history of suicidal behavior: Secondary | ICD-10-CM

## 2022-08-29 DIAGNOSIS — Z91018 Allergy to other foods: Secondary | ICD-10-CM

## 2022-08-29 DIAGNOSIS — Z79899 Other long term (current) drug therapy: Secondary | ICD-10-CM

## 2022-08-29 DIAGNOSIS — Z9884 Bariatric surgery status: Secondary | ICD-10-CM

## 2022-08-29 DIAGNOSIS — R45851 Suicidal ideations: Secondary | ICD-10-CM | POA: Diagnosis present

## 2022-08-29 DIAGNOSIS — G8929 Other chronic pain: Secondary | ICD-10-CM | POA: Diagnosis present

## 2022-08-29 DIAGNOSIS — F3160 Bipolar disorder, current episode mixed, unspecified: Principal | ICD-10-CM | POA: Diagnosis present

## 2022-08-29 DIAGNOSIS — Z91030 Bee allergy status: Secondary | ICD-10-CM | POA: Diagnosis not present

## 2022-08-29 DIAGNOSIS — D75839 Thrombocytosis, unspecified: Secondary | ICD-10-CM | POA: Diagnosis present

## 2022-08-29 DIAGNOSIS — Z9081 Acquired absence of spleen: Secondary | ICD-10-CM

## 2022-08-29 DIAGNOSIS — I10 Essential (primary) hypertension: Secondary | ICD-10-CM | POA: Diagnosis present

## 2022-08-29 DIAGNOSIS — G8921 Chronic pain due to trauma: Secondary | ICD-10-CM | POA: Diagnosis present

## 2022-08-29 DIAGNOSIS — G4700 Insomnia, unspecified: Secondary | ICD-10-CM | POA: Diagnosis present

## 2022-08-29 DIAGNOSIS — Z560 Unemployment, unspecified: Secondary | ICD-10-CM

## 2022-08-29 DIAGNOSIS — F17210 Nicotine dependence, cigarettes, uncomplicated: Secondary | ICD-10-CM | POA: Diagnosis present

## 2022-08-29 DIAGNOSIS — Z9841 Cataract extraction status, right eye: Secondary | ICD-10-CM

## 2022-08-29 DIAGNOSIS — Z592 Discord with neighbors, lodgers and landlord: Secondary | ICD-10-CM

## 2022-08-29 DIAGNOSIS — Z888 Allergy status to other drugs, medicaments and biological substances status: Secondary | ICD-10-CM

## 2022-08-29 DIAGNOSIS — R45850 Homicidal ideations: Secondary | ICD-10-CM | POA: Diagnosis present

## 2022-08-29 DIAGNOSIS — E119 Type 2 diabetes mellitus without complications: Secondary | ICD-10-CM | POA: Diagnosis present

## 2022-08-29 DIAGNOSIS — Z961 Presence of intraocular lens: Secondary | ICD-10-CM | POA: Diagnosis present

## 2022-08-29 DIAGNOSIS — F419 Anxiety disorder, unspecified: Secondary | ICD-10-CM | POA: Diagnosis present

## 2022-08-29 DIAGNOSIS — Z9842 Cataract extraction status, left eye: Secondary | ICD-10-CM | POA: Diagnosis not present

## 2022-08-29 DIAGNOSIS — F4310 Post-traumatic stress disorder, unspecified: Secondary | ICD-10-CM | POA: Diagnosis present

## 2022-08-29 DIAGNOSIS — F319 Bipolar disorder, unspecified: Principal | ICD-10-CM | POA: Diagnosis present

## 2022-08-29 DIAGNOSIS — Z7985 Long-term (current) use of injectable non-insulin antidiabetic drugs: Secondary | ICD-10-CM | POA: Diagnosis not present

## 2022-08-29 DIAGNOSIS — F3163 Bipolar disorder, current episode mixed, severe, without psychotic features: Secondary | ICD-10-CM | POA: Diagnosis not present

## 2022-08-29 DIAGNOSIS — Z886 Allergy status to analgesic agent status: Secondary | ICD-10-CM

## 2022-08-29 DIAGNOSIS — Z9152 Personal history of nonsuicidal self-harm: Secondary | ICD-10-CM

## 2022-08-29 MED ORDER — LORAZEPAM 2 MG/ML IJ SOLN: 2.0000 mg | Freq: Three times a day (TID) | INTRAMUSCULAR | Status: DC | PRN

## 2022-08-29 MED ORDER — DIPHENHYDRAMINE HCL 50 MG/ML IJ SOLN: 50.0000 mg | Freq: Three times a day (TID) | INTRAMUSCULAR | Status: DC | PRN

## 2022-08-29 MED ORDER — PNEUMOCOCCAL VAC POLYVALENT 25 MCG/0.5ML IJ INJ
0.5000 mL | INJECTION | INTRAMUSCULAR | Status: DC
Start: 2022-08-30 — End: 2022-08-29

## 2022-08-29 MED ORDER — HYDROXYZINE HCL 25 MG PO TABS
25.0000 mg | ORAL_TABLET | Freq: Three times a day (TID) | ORAL | Status: DC | PRN
  Administered 2022-08-29 – 2022-09-05 (×2): 25 mg via ORAL
  Filled 2022-08-29 (×2): qty 1

## 2022-08-29 MED ORDER — DIPHENHYDRAMINE HCL 25 MG PO CAPS: 50.0000 mg | ORAL_CAPSULE | Freq: Three times a day (TID) | ORAL | Status: DC | PRN

## 2022-08-29 MED ORDER — MAGNESIUM HYDROXIDE 400 MG/5ML PO SUSP: 30.0000 mL | Freq: Every day | ORAL | Status: DC | PRN

## 2022-08-29 MED ORDER — LORAZEPAM 1 MG PO TABS: 2.0000 mg | ORAL_TABLET | Freq: Three times a day (TID) | ORAL | Status: DC | PRN

## 2022-08-29 MED ORDER — HALOPERIDOL 5 MG PO TABS: 5.0000 mg | ORAL_TABLET | Freq: Three times a day (TID) | ORAL | Status: DC | PRN

## 2022-08-29 MED ORDER — TRAZODONE HCL 50 MG PO TABS
50.0000 mg | ORAL_TABLET | Freq: Every evening | ORAL | Status: DC | PRN
  Administered 2022-08-29 – 2022-08-30 (×2): 50 mg via ORAL
  Filled 2022-08-29 (×2): qty 1

## 2022-08-29 MED ORDER — ALUM & MAG HYDROXIDE-SIMETH 200-200-20 MG/5ML PO SUSP: 30.0000 mL | ORAL | Status: DC | PRN

## 2022-08-29 MED ORDER — PNEUMOCOCCAL VAC POLYVALENT 25 MCG/0.5ML IJ INJ
0.5000 mL | INJECTION | INTRAMUSCULAR | Status: DC
  Filled 2022-08-29: qty 0.5

## 2022-08-29 MED ORDER — HALOPERIDOL LACTATE 5 MG/ML IJ SOLN: 5.0000 mg | Freq: Three times a day (TID) | INTRAMUSCULAR | Status: DC | PRN

## 2022-08-29 NOTE — [facility] Group Notes (Signed)
Adult Psychoeducational Group Note  Date:  08/29/2022 Time:  10:21 PM  Group Topic/Focus:  Wrap-Up Group:   The focus of this group is to help patients review their daily goal of treatment and discuss progress on daily workbooks.  Participation Level:  Active  Participation Quality:  Attentive  Affect:  Appropriate  Cognitive:  Alert  Insight: Improving  Engagement in Group:  Engaged  Modes of Intervention:  Discussion  Additional Comments:  Pt attended and participated in group.  Maura Crandall [name] 08/29/2022, 10:21 PM
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
## 2022-08-29 NOTE — Tx Team (Signed)
Initial Treatment Plan 08/29/2022 4:05 PM Khalidah AARSHI PRECHTL HYQ:[id number]    PATIENT STRESSORS: Financial difficulties   Loss of family members and friends     PATIENT STRENGTHS: Supportive family/friends    PATIENT IDENTIFIED PROBLEMS: Financial difficulties related to economy                      DISCHARGE CRITERIA:  Ability to meet basic life and health needs Improved stabilization in mood, thinking, and/or behavior  PRELIMINARY DISCHARGE PLAN: Return to previous work or school arrangements  PATIENT/FAMILY INVOLVEMENT: This treatment plan has been presented to and reviewed with the patient, Cheryl Stokes, and/or family member The patient and family have been given the opportunity to ask questions and make suggestions.  Mathews Argyle, RN 08/29/2022, 4:05 PM

## 2022-08-29 NOTE — Progress Notes (Signed)
Pt has been accepted to [facility] TODAY 08/29/2022. Bed assignment: 403-1  Pt meets inpatient criteria per Doran Heater, NP  Attending Physician will be Phineas Inches, MD  Report can be called to: - Adult unit: [phone]  Pt can arrive after pending discharges  Care Team Notified: [facility] Rona Ravens, RN, Florentina Addison, RN, Gevena Mart, RN, and Doran Heater, NP  Cathie Beams, LCSW  08/29/2022 11:08 AM

## 2022-08-29 NOTE — ED Provider Notes (Signed)
Emergency Medicine Observation Re-evaluation Note  Cheryl Stokes is a 47 y.o. female, seen on rounds today.  Pt initially presented to the ED for complaints of Homicidal Currently, the patient is resting.  Physical Exam  BP (!) 147/90 (BP Location: Right Arm)   Pulse 81   Temp 97.6 F (36.4 C) (Oral)   Resp 18   Ht 5\' 9"  (1.753 m)   Wt 72.1 kg   LMP 08/14/2022   SpO2 98%   BMI 23.48 kg/m  Physical Exam General: nad Lungs: no resp distress Psych: calm  ED Course / MDM  EKG:EKG Interpretation Date/Time:  Monday August 27 2022 16:31:42 EDT Ventricular Rate:  73 PR Interval:  144 QRS Duration:  62 QT Interval:  394 QTC Calculation: 434 R Axis:   58  Text Interpretation: Normal sinus rhythm Low voltage QRS Confirmed by Cathren Laine ([id number]) on 08/27/2022 4:46:06 PM  I have reviewed the labs performed to date as well as medications administered while in observation.  Recent changes in the last 24 hours include accepted at [facility].  Plan  Current plan is for go to [facility] pending discharges today.    Sloan Leiter, DO 08/29/22 [phone]

## 2022-08-30 DIAGNOSIS — F3163 Bipolar disorder, current episode mixed, severe, without psychotic features: Secondary | ICD-10-CM | POA: Diagnosis not present

## 2022-08-30 MED ORDER — ZOLPIDEM TARTRATE 5 MG PO TABS
5.0000 mg | ORAL_TABLET | Freq: Every day | ORAL | Status: DC
  Administered 2022-08-30 – 2022-09-04 (×6): 5 mg via ORAL
  Filled 2022-08-30 (×6): qty 1

## 2022-08-30 MED ORDER — LORAZEPAM 0.5 MG PO TABS
0.5000 mg | ORAL_TABLET | Freq: Two times a day (BID) | ORAL | Status: AC
  Administered 2022-08-30 – 2022-09-01 (×5): 0.5 mg via ORAL
  Filled 2022-08-30 (×5): qty 1

## 2022-08-30 MED ORDER — WHITE PETROLATUM EX OINT
TOPICAL_OINTMENT | CUTANEOUS | Status: AC
  Administered 2022-08-30: 1
  Filled 2022-08-30: qty 5

## 2022-08-30 MED ORDER — LORAZEPAM 1 MG PO TABS: 1.0000 mg | ORAL_TABLET | Freq: Two times a day (BID) | ORAL | Status: DC

## 2022-08-30 MED ORDER — WHITE PETROLATUM EX OINT
TOPICAL_OINTMENT | CUTANEOUS | Status: AC
  Filled 2022-08-30: qty 5

## 2022-08-30 MED ORDER — OXYCODONE HCL 5 MG PO TABS
5.0000 mg | ORAL_TABLET | Freq: Three times a day (TID) | ORAL | Status: DC | PRN
  Administered 2022-08-30 – 2022-09-05 (×15): 5 mg via ORAL
  Filled 2022-08-30 (×17): qty 1

## 2022-08-30 MED ORDER — OXYCODONE HCL 5 MG PO TABS
10.0000 mg | ORAL_TABLET | Freq: Three times a day (TID) | ORAL | Status: DC | PRN
  Administered 2022-08-30: 10 mg via ORAL
  Filled 2022-08-30 (×2): qty 2

## 2022-08-30 MED ORDER — PNEUMOCOCCAL 20-VAL CONJ VACC 0.5 ML IM SUSY
0.5000 mL | PREFILLED_SYRINGE | INTRAMUSCULAR | Status: AC
  Administered 2022-08-30: 0.5 mL via INTRAMUSCULAR
  Filled 2022-08-30: qty 0.5

## 2022-08-30 MED ORDER — PROPRANOLOL HCL 10 MG PO TABS
10.0000 mg | ORAL_TABLET | Freq: Two times a day (BID) | ORAL | Status: DC
  Administered 2022-08-30 – 2022-09-05 (×12): 10 mg via ORAL
  Filled 2022-08-30 (×16): qty 1

## 2022-08-30 MED ORDER — LURASIDONE HCL 40 MG PO TABS
40.0000 mg | ORAL_TABLET | Freq: Every day | ORAL | Status: DC
  Administered 2022-08-30 – 2022-08-31 (×2): 40 mg via ORAL
  Filled 2022-08-30 (×5): qty 1

## 2022-08-30 MED ORDER — WHITE PETROLATUM EX OINT
TOPICAL_OINTMENT | CUTANEOUS | Status: AC
  Administered 2022-08-30: 2
  Filled 2022-08-30: qty 10

## 2022-08-30 MED ORDER — OXYCODONE HCL 5 MG PO TABS: 5.0000 mg | ORAL_TABLET | Freq: Three times a day (TID) | ORAL | Status: DC | PRN

## 2022-08-30 NOTE — Progress Notes (Signed)
   08/30/22 0919  Psych Admission Type (Psych Patients Only)  Admission Status Involuntary  Psychosocial Assessment  Patient Complaints Anxiety;Worrying  Eye Contact Fair  Facial Expression Anxious;Animated  Affect Anxious  Speech Logical/coherent  Interaction Assertive;Guarded  Motor Activity Slow  Appearance/Hygiene Unremarkable  Behavior Characteristics Appropriate to situation  Mood Anxious;Pleasant  Thought Process  Coherency WDL  Content WDL  Delusions None reported or observed  Perception WDL  Hallucination None reported or observed  Judgment Poor  Confusion None  Danger to Self  Current suicidal ideation? Denies  Danger to Others  Danger to Others None reported or observed

## 2022-08-30 NOTE — Progress Notes (Signed)

## 2022-08-30 NOTE — Progress Notes (Signed)
D) Pt received calm, visible, participating in milieu, and in no acute distress. Pt A & O x4. Pt denies SI, HI, A/ V H, depression at this time. Pt endorses pain and anxiety at bedtime.  A) Pt encouraged to drink fluids. Pt encouraged to come to staff with needs. Pt encouraged to attend and participate in groups. Pt encouraged to set reachable goals.  R) Pt remained safe on unit, in no acute distress, will continue to assess.     08/30/22 2200  Psych Admission Type (Psych Patients Only)  Admission Status Involuntary  Psychosocial Assessment  Patient Complaints Anxiety  Eye Contact Fair  Facial Expression Animated  Affect Anxious  Speech Logical/coherent  Interaction Assertive  Motor Activity Slow  Appearance/Hygiene Unremarkable  Behavior Characteristics Appropriate to situation  Mood Pleasant  Thought Process  Coherency WDL  Content WDL  Delusions None reported or observed  Perception WDL  Hallucination None reported or observed  Judgment Poor  Confusion None  Danger to Self  Current suicidal ideation? Denies  Danger to Others  Danger to Others None reported or observed
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
## 2022-08-30 NOTE — Group Note (Signed)
[facility] LCSW Group Therapy Note   Group Date: 08/30/2022 Start Time: 1100 End Time: 1200   Type of Therapy/Topic:  Group Therapy:  Emotion Regulation  Participation Level:  Active   Mood:  Description of Group:    The purpose of this group is to assist patients in learning to regulate negative emotions and experience positive emotions. Patients will be guided to discuss ways in which they have been vulnerable to their negative emotions. These vulnerabilities will be juxtaposed with experiences of positive emotions or situations, and patients challenged to use positive emotions to combat negative ones. Special emphasis will be placed on coping with negative emotions in conflict situations, and patients will process healthy conflict resolution skills.  Therapeutic Goals: Patient will identify two positive emotions or experiences to reflect on in order to balance out negative emotions:  Patient will label two or more emotions that they find the most difficult to experience:  Patient will be able to demonstrate positive conflict resolution skills through discussion or role plays:   Summary of Patient Progress:   Patient was engaged and participated in group discussions.     Therapeutic Modalities:   Cognitive Behavioral Therapy Feelings Identification Dialectical Behavioral Therapy   Starleen Arms, LCSW

## 2022-08-30 NOTE — [facility] Group Notes (Signed)
[facility] Group Notes:  (Nursing/MHT/Case Management/Adjunct)  Date:  08/30/2022  Time:  8:47 PM  Type of Therapy:  Wrap Up Group  Participation Level:  Active  Participation Quality:  Attentive  Affect:  Appropriate  Cognitive:  Appropriate  Insight:  Improving  Engagement in Group:  Improving  Modes of Intervention:  Discussion  Summary of Progress/Problems:Pt said she had a good day of 10/10, she enjoyed the gym and ok with treatment  Izella Ybanez E Millena Callins 08/30/2022, 8:47 PM

## 2022-08-30 NOTE — Group Note (Signed)
Date:  08/30/2022 Time:  11:42 AM  Group Topic/Focus:  Goals Group:   The focus of this group is to help patients establish daily goals to achieve during treatment and discuss how the patient can incorporate goal setting into their daily lives to aide in recovery.    Participation Level:  Did Not Attend  Participation Quality:      Affect:      Cognitive:      Insight: None  Engagement in Group:      Modes of Intervention:      Additional Comments:     Reymundo Poll 08/30/2022, 11:42 AM

## 2022-08-31 ENCOUNTER — Encounter (HOSPITAL_COMMUNITY): Payer: Self-pay

## 2022-08-31 DIAGNOSIS — F3163 Bipolar disorder, current episode mixed, severe, without psychotic features: Secondary | ICD-10-CM | POA: Diagnosis not present

## 2022-08-31 MED ORDER — VITAMIN B-12 100 MCG PO TABS
100.0000 ug | ORAL_TABLET | Freq: Every day | ORAL | Status: DC
  Administered 2022-08-31 – 2022-09-05 (×6): 100 ug via ORAL
  Filled 2022-08-31 (×7): qty 1

## 2022-08-31 MED ORDER — VITAMIN D3 25 MCG PO TABS
1000.0000 [IU] | ORAL_TABLET | Freq: Every day | ORAL | Status: DC
  Administered 2022-08-31 – 2022-09-05 (×6): 1000 [IU] via ORAL
  Filled 2022-08-31 (×7): qty 1

## 2022-08-31 MED ORDER — VITAMIN D3 25 MCG PO TABS: 5000.0000 [IU] | ORAL_TABLET | ORAL | Status: DC

## 2022-08-31 MED ORDER — VITAMIN D3 25 MCG PO TABS
1000.0000 [IU] | ORAL_TABLET | Freq: Every day | ORAL | Status: DC
  Filled 2022-08-31 (×3): qty 1

## 2022-08-31 MED ORDER — LURASIDONE HCL 60 MG PO TABS
60.0000 mg | ORAL_TABLET | Freq: Every day | ORAL | Status: DC
  Administered 2022-09-01 – 2022-09-02 (×2): 60 mg via ORAL
  Filled 2022-08-31 (×4): qty 1
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
## 2022-08-31 NOTE — Progress Notes (Signed)
Adjustment to patient's medication: Increase Latuda from 40 mg to 60 mg p.o. daily starting tomorrow 09/01/2022 for bipolar disorder mixed type, vitamin B12 100 mcg 1 p.o. daily for supplement, vitamin D3 5,000 units q. weekly on Fridays. Vitamin D3 and B12 baseline labs ordered. Patient aware of medication adjustments / lab orders and is in agreement.  Alan Mulder, NP Psychiatry

## 2022-08-31 NOTE — BHH Group Notes (Signed)
Adult Psychoeducational Group Note  Date:  08/31/2022 Time:  9:43 PM  Group Topic/Focus:  Wrap-Up Group:   The focus of this group is to help patients review their daily goal of treatment and discuss progress on daily workbooks.  Participation Level:  Active  Participation Quality:  Appropriate  Affect:  Appropriate  Cognitive:  Appropriate  Insight: Appropriate  Engagement in Group:  Engaged  Modes of Intervention:  Discussion and Support  Additional Comments:  Pt attended the evening AA meeting.  [name] 08/31/2022, 9:43 PM

## 2022-08-31 NOTE — Plan of Care (Signed)
Nurse discussed anxiety, depression and coping skills with patient.  
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
## 2022-08-31 NOTE — Progress Notes (Addendum)
D:  Patient's self inventory sheet, patient sleeps good, sleep medication helpful.  Good appetite, high energy level, good concentration.  Denied depression, hopeless and anxiety.  Denied withdrawals.  Denied SI.  Physical problems, pain, worst pain #9 in past 24 hours, pain medicine helpful.  Goal discharge.  Plans to  attend groups, continue meds.  Feeling great.  Does have discharge plan. A:  Medications administered per MD orders.  Emotional support and encouragement given patient. R:  Denied SI and HI, contracts for safety.  Denied A/V hallucinations.  Safety maintained with 15 minute checks.  Patient stated she slept 8 hours.  Would like to take vitamins while at [facility].
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
## 2022-08-31 NOTE — Group Note (Signed)
Date:  08/31/2022 Time:  10:01 AM  Group Topic/Focus:  Goals Group:   The focus of this group is to help patients establish daily goals to achieve during treatment and discuss how the patient can incorporate goal setting into their daily lives to aide in recovery.    Participation Level:  Did Not Attend  Participation Quality:      Affect:      Cognitive:      Insight: None  Engagement in Group  Modes of Intervention:      Additional Comments:      Reymundo Poll 08/31/2022, 10:01 AM

## 2022-08-31 NOTE — BH IP Treatment Plan (Signed)
Interdisciplinary Treatment and Diagnostic Plan Update  08/31/2022 Time of Session: 10:45am Cheryl Stokes MRN: [id number]  Principal Diagnosis: Bipolar affective disorder [facility])  Secondary Diagnoses: Principal Problem:   Bipolar affective disorder (HCC)   Current Medications:  Current Facility-Administered Medications  Medication Dose Route Frequency Provider Last Rate Last Admin   alum & mag hydroxide-simeth (MAALOX/MYLANTA) 200-200-20 MG/5ML suspension 30 mL  30 mL Oral Q4H PRN Onuoha, Chinwendu V, NP       diphenhydrAMINE (BENADRYL) capsule 50 mg  50 mg Oral TID PRN Onuoha, Chinwendu V, NP       Or   diphenhydrAMINE (BENADRYL) injection 50 mg  50 mg Intramuscular TID PRN Onuoha, Chinwendu V, NP       haloperidol (HALDOL) tablet 5 mg  5 mg Oral TID PRN Onuoha, Chinwendu V, NP       Or   haloperidol lactate (HALDOL) injection 5 mg  5 mg Intramuscular TID PRN Onuoha, Chinwendu V, NP       hydrOXYzine (ATARAX) tablet 25 mg  25 mg Oral TID PRN Onuoha, Chinwendu V, NP   25 mg at 08/29/22 2149   LORazepam (ATIVAN) tablet 2 mg  2 mg Oral TID PRN Onuoha, Chinwendu V, NP       Or   LORazepam (ATIVAN) injection 2 mg  2 mg Intramuscular TID PRN Onuoha, Chinwendu V, NP       LORazepam (ATIVAN) tablet 0.5 mg  0.5 mg Oral BID Lauro Franklin, MD   0.5 mg at 08/31/22 0750   lurasidone (LATUDA) tablet 40 mg  40 mg Oral Q breakfast Lauro Franklin, MD   40 mg at 08/31/22 0746   magnesium hydroxide (MILK OF MAGNESIA) suspension 30 mL  30 mL Oral Daily PRN Onuoha, Chinwendu V, NP       oxyCODONE (Oxy IR/ROXICODONE) immediate release tablet 5 mg  5 mg Oral TID PRN Lauro Franklin, MD   5 mg at 08/31/22 0750   propranolol (INDERAL) tablet 10 mg  10 mg Oral BID Cecilie Lowers, FNP   10 mg at 08/31/22 0746   traZODone (DESYREL) tablet 50 mg  50 mg Oral QHS PRN Onuoha, Chinwendu V, NP   50 mg at 08/30/22 2124   zolpidem (AMBIEN) tablet 5 mg  5 mg Oral QHS Lauro Franklin, MD   5 mg  at 08/30/22 2124   PTA Medications: Medications Prior to Admission  Medication Sig Dispense Refill Last Dose   amoxicillin (AMOXIL) 500 MG capsule Take 500 mg by mouth every 8 (eight) hours.      cholecalciferol (VITAMIN D) 25 MCG (1000 UNIT) tablet Take 1,000 Units by mouth daily.      cyclobenzaprine (FLEXERIL) 10 MG tablet Take 10 mg by mouth 3 (three) times daily as needed.      famotidine (PEPCID) 20 MG tablet Take 20 mg by mouth as needed for heartburn.      ferrous sulfate 325 (65 FE) MG tablet Take 325 mg by mouth daily.      hydrOXYzine (ATARAX/VISTARIL) 25 MG tablet Take 25 mg by mouth 3 (three) times daily as needed.      ibuprofen (ADVIL,MOTRIN) 800 MG tablet Take 800 mg by mouth every 8 (eight) hours as needed for mild pain.       labetalol (NORMODYNE) 100 MG tablet Take 100 mg by mouth 2 (two) times daily.      Multiple Vitamins-Minerals (ZINC PO) Take 22 mg by mouth daily.  naloxone (NARCAN) nasal spray 4 mg/0.1 mL Place 1 spray into the nose once.      omeprazole (PRILOSEC) 20 MG capsule Take 1 capsule (20 mg total) by mouth daily. (Patient taking differently: Take 20 mg by mouth as needed (heartburn, acid reflux).) 30 capsule 0    ondansetron (ZOFRAN ODT) 4 MG disintegrating tablet 4mg  ODT q4 hours prn nausea/vomit (Patient taking differently: Take by mouth every 8 (eight) hours as needed for vomiting or nausea.) 10 tablet 0    Oxycodone HCl 10 MG TABS Take 10 mg by mouth 3 (three) times daily as needed (pain/pinched nerves).      OZEMPIC, 1 MG/DOSE, 4 MG/3ML SOPN Inject 1 mg into the skin once a week.      promethazine (PHENERGAN) 25 MG tablet Take 25 mg by mouth every 6 (six) hours as needed for nausea.      tetrahydrozoline-zinc (VISINE-AC) 0.05-0.25 % ophthalmic solution Place 2 drops into both eyes 3 (three) times daily as needed (allergies, itching).      zolpidem (AMBIEN) 10 MG tablet Take 10 mg by mouth at bedtime.       Patient Stressors: Financial difficulties    Loss of family members and friends    Patient Strengths: Supportive family/friends   Treatment Modalities: Medication Management, Group therapy, Case management,  1 to 1 session with clinician, Psychoeducation, Recreational therapy.   Physician Treatment Plan for Primary Diagnosis: Bipolar affective disorder (HCC) Long Term Goal(s):     Short Term Goals:    Medication Management: Evaluate patient's response, side effects, and tolerance of medication regimen.  Therapeutic Interventions: 1 to 1 sessions, Unit Group sessions and Medication administration.  Evaluation of Outcomes: Progressing  Physician Treatment Plan for Secondary Diagnosis: Principal Problem:   Bipolar affective disorder (HCC)  Long Term Goal(s):     Short Term Goals:       Medication Management: Evaluate patient's response, side effects, and tolerance of medication regimen.  Therapeutic Interventions: 1 to 1 sessions, Unit Group sessions and Medication administration.  Evaluation of Outcomes: Progressing   RN Treatment Plan for Primary Diagnosis: Bipolar affective disorder (HCC) Long Term Goal(s): Knowledge of disease and therapeutic regimen to maintain health will improve  Short Term Goals: Ability to remain free from injury will improve, Ability to verbalize frustration and anger appropriately will improve, Ability to participate in decision making will improve, Ability to verbalize feelings will improve, Ability to identify and develop effective coping behaviors will improve, and Compliance with prescribed medications will improve  Medication Management: RN will administer medications as ordered by provider, will assess and evaluate patient's response and provide education to patient for prescribed medication. RN will report any adverse and/or side effects to prescribing provider.  Therapeutic Interventions: 1 on 1 counseling sessions, Psychoeducation, Medication administration, Evaluate responses to  treatment, Monitor vital signs and CBGs as ordered, Perform/monitor CIWA, COWS, AIMS and Fall Risk screenings as ordered, Perform wound care treatments as ordered.  Evaluation of Outcomes: Progressing   LCSW Treatment Plan for Primary Diagnosis: Bipolar affective disorder (HCC) Long Term Goal(s): Safe transition to appropriate next level of care at discharge, Engage patient in therapeutic group addressing interpersonal concerns.  Short Term Goals: Engage patient in aftercare planning with referrals and resources, Increase social support, Increase emotional regulation, Facilitate acceptance of mental health diagnosis and concerns, Identify triggers associated with mental health/substance abuse issues, and Increase skills for wellness and recovery  Therapeutic Interventions: Assess for all discharge needs, 1 to 1 time with Social  worker, Explore available resources and support systems, Assess for adequacy in community support network, Educate family and significant other(s) on suicide prevention, Complete Psychosocial Assessment, Interpersonal group therapy.  Evaluation of Outcomes: Progressing   Progress in Treatment: Attending groups: Yes. Participating in groups: Yes. Taking medication as prescribed: Yes. Toleration medication: Yes. Family/Significant other contact made: No, will contact:  Walden Field, spouse, [phone] Patient understands diagnosis: Yes. Discussing patient identified problems/goals with staff: Yes. Medical problems stabilized or resolved: Yes. Denies suicidal/homicidal ideation: Yes. Issues/concerns per patient self-inventory: No.  New problem(s) identified: No, Describe:  none reported  New Short Term/Long Term Goal(s):medication stabilization, elimination of SI thoughts, development of comprehensive mental wellness plan.    Patient Goals:  "to get back on medications and getting back on track."  Discharge Plan or Barriers: Patient recently admitted. CSW  will continue to follow and assess for appropriate referrals and possible discharge planning.    Reason for Continuation of Hospitalization: Aggression Homicidal ideation Medication stabilization Suicidal ideation  Estimated Length of Stay: 5-7 days  Last 3 [place] Suicide Severity Risk Score: Flowsheet Row [facility] (Current[facility] from 08/27/2022 in [facility] Department at [facility] ED from 02/02/2022 in [facility] Emergency Department at [facility]  C-SSRS RISK CATEGORY No Risk No Risk No Risk       Last PHQ 2/9 Scores:     No data to display          Scribe for Treatment Team: Izell Lorton, LCSW 08/31/2022 2:00 PM

## 2022-09-01 DIAGNOSIS — F3163 Bipolar disorder, current episode mixed, severe, without psychotic features: Secondary | ICD-10-CM | POA: Diagnosis not present

## 2022-09-01 LAB — LIPID PANEL
Cholesterol: 198 mg/dL (ref 0–200)
HDL: 49 mg/dL (ref >40)
LDL Cholesterol: 109 mg/dL — ABNORMAL HIGH (ref 0–99)
Total CHOL/HDL Ratio: 4 RATIO
Triglycerides: 200 mg/dL — ABNORMAL HIGH (ref <150)
VLDL: 40 mg/dL (ref 0–40)

## 2022-09-01 LAB — TSH: TSH: 2.508 u[IU]/mL (ref 0.350–4.500)

## 2022-09-01 MED ORDER — AMOXICILLIN-POT CLAVULANATE 875-125 MG PO TABS
1.0000 | ORAL_TABLET | Freq: Two times a day (BID) | ORAL | Status: DC
  Administered 2022-09-01 – 2022-09-05 (×9): 1 via ORAL
  Filled 2022-09-01 (×11): qty 1

## 2022-09-01 NOTE — Progress Notes (Signed)
   09/01/22 1478  15 Minute Checks  Location Dayroom  Visual Appearance Calm  Behavior Composed  Sleep (Behavioral Health Patients Only)  Calculate sleep? (Click Yes once per 24 hr at 0600 safety check) Yes  Documented sleep last 24 hours 5.25

## 2022-09-01 NOTE — Progress Notes (Signed)
   09/01/22 1200  Psych Admission Type (Psych Patients Only)  Admission Status Involuntary  Psychosocial Assessment  Patient Complaints None  Eye Contact Fair  Facial Expression Anxious  Affect Appropriate to circumstance  Speech Logical/coherent  Interaction Assertive  Motor Activity Slow  Appearance/Hygiene Unremarkable  Behavior Characteristics Cooperative;Appropriate to situation  Mood Anxious;Pleasant  Thought Process  Coherency WDL  Content WDL  Delusions None reported or observed  Perception WDL  Hallucination None reported or observed  Judgment Poor  Confusion None  Danger to Self  Current suicidal ideation? Denies  Danger to Others  Danger to Others None reported or observed

## 2022-09-01 NOTE — Progress Notes (Signed)
D. Pt has been friendly, pleasant during interactions- observed in the milieu interacting appropriately with peers and staff. Per pt's self inventory, pt rated her depression,hopelessness and anxiety all 0's. Pt's stated goal today was "to work on going home", "to attend my groups and stay focused on my healing journey." Pt currently denies SI/HI and AVH  A. Labs and vitals monitored. Pt given and educated on medications. Pt supported emotionally and encouraged to express concerns and ask questions.   R. Pt remains safe with 15 minute checks. Will continue POC.

## 2022-09-01 NOTE — Plan of Care (Signed)
  Problem: Education: Goal: Verbalization of understanding the information provided will improve Outcome: Progressing   Problem: Activity: Goal: Interest or engagement in activities will improve Outcome: Progressing Goal: Sleeping patterns will improve Outcome: Progressing   
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
## 2022-09-01 NOTE — Progress Notes (Signed)
   08/31/22 2140  Psych Admission Type (Psych Patients Only)  Admission Status Involuntary  Psychosocial Assessment  Patient Complaints Anxiety  Eye Contact Fair  Facial Expression Anxious  Affect Appropriate to circumstance;Anxious  Speech Logical/coherent  Interaction Assertive  Motor Activity Slow  Appearance/Hygiene Unremarkable  Behavior Characteristics Appropriate to situation  Thought Process  Coherency WDL  Content WDL  Delusions None reported or observed  Perception WDL  Hallucination None reported or observed  Judgment Poor  Confusion None  Danger to Self  Current suicidal ideation? Denies  Description of Agreement verbal  Danger to Others  Danger to Others None reported or observed

## 2022-09-01 NOTE — Progress Notes (Signed)
   09/01/22 2120  Psych Admission Type (Psych Patients Only)  Admission Status Involuntary  Psychosocial Assessment  Patient Complaints None  Eye Contact Fair  Facial Expression Anxious  Affect Appropriate to circumstance;Anxious  Speech Logical/coherent  Interaction Assertive  Motor Activity Slow  Appearance/Hygiene Unremarkable  Behavior Characteristics Cooperative;Appropriate to situation  Mood Pleasant  Thought Process  Coherency WDL  Content WDL  Delusions None reported or observed  Perception WDL  Hallucination None reported or observed  Judgment Poor  Confusion None  Danger to Self  Current suicidal ideation? Denies  Description of Agreement verbal  Danger to Others  Danger to Others None reported or observed

## 2022-09-01 NOTE — Group Note (Signed)
Date:  09/01/2022 Time:  10:21 PM  Group Topic/Focus:  Wrap-Up Group:   The focus of this group is to help patients review their daily goal of treatment and discuss progress on daily workbooks.    Participation Level:  Active  Participation Quality:  Appropriate and Sharing  Affect:  Appropriate  Cognitive:  Appropriate  Insight: Appropriate  Engagement in Group:  Engaged  Modes of Intervention:  Discussion and Socialization  Additional Comments:  The patient rated her day a 10 out of 10. The patient stated that she got some good sleep. The patient stated that she is hoping for DC either on tomorrow or Monday. The patient stated that since she got here, she has seen improvement within herself. The patient stated that her goal is to continue taking her medication and attend more groups which she stated is very helpful for her.   Kennieth Francois 09/01/2022, 10:21 PM

## 2022-09-02 DIAGNOSIS — F3163 Bipolar disorder, current episode mixed, severe, without psychotic features: Secondary | ICD-10-CM | POA: Diagnosis not present

## 2022-09-02 LAB — HEMOGLOBIN A1C
Hgb A1c MFr Bld: 6.1 % — ABNORMAL HIGH (ref 4.8–5.6)
Mean Plasma Glucose: 128.37 mg/dL

## 2022-09-02 MED ORDER — LURASIDONE HCL 80 MG PO TABS
80.0000 mg | ORAL_TABLET | Freq: Every day | ORAL | Status: DC
  Administered 2022-09-03 – 2022-09-05 (×3): 80 mg via ORAL
  Filled 2022-09-02 (×4): qty 1
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
## 2022-09-02 NOTE — Progress Notes (Signed)
   09/02/22 1300  Psych Admission Type (Psych Patients Only)  Admission Status Involuntary  Psychosocial Assessment  Patient Complaints None  Eye Contact Fair  Facial Expression Anxious  Affect Appropriate to circumstance  Speech Logical/coherent  Interaction Assertive  Motor Activity Slow  Appearance/Hygiene Unremarkable  Behavior Characteristics Cooperative;Appropriate to situation  Mood Pleasant  Thought Process  Coherency WDL  Content WDL  Delusions None reported or observed  Perception WDL  Hallucination None reported or observed  Judgment Poor  Confusion None  Danger to Self  Current suicidal ideation? Denies  Danger to Others  Danger to Others None reported or observed

## 2022-09-02 NOTE — Group Note (Signed)
Date:  09/02/2022 Time:  9:45 PM  Group Topic/Focus:  Wrap-Up Group:   The focus of this group is to help patients review their daily goal of treatment and discuss progress on daily workbooks.    Participation Level:  Active  Participation Quality:  Appropriate, Sharing, and Supportive  Affect:  Appropriate  Cognitive:  Appropriate  Insight: Appropriate  Engagement in Group:  Engaged and Supportive  Modes of Intervention:  Discussion, Socialization, and Support  Additional Comments:  The patient stated that she had a good day today and that it's getting better. The patient rated her day 10 out of 10. The patient ed stated that she got to interact with other throughout the day. The patient stated that her goal for today was to keep pushing through, and stay on medication so she can be DC soon.   Kennieth Francois 09/02/2022, 9:45 PM

## 2022-09-02 NOTE — Plan of Care (Signed)
  Problem: Education: Goal: Knowledge of [facility] Education information/materials will improve Outcome: Progressing Goal: Emotional status will improve Outcome: Progressing Goal: Mental status will improve Outcome: Progressing   

## 2022-09-02 NOTE — [facility] Group Notes (Signed)
LCSW Group Therapy Note  09/02/2022   10:00-11:00am   Type of Therapy and Topic:  Group Therapy: Anger Cues and Responses  Participation Level:  Minimal   Description of Group:   In this group, patients learned how to recognize the physical, cognitive, emotional, and behavioral responses they have to anger-provoking situations.  They identified a recent time they became angry and how they reacted.  They analyzed how their reaction was possibly beneficial and how it was possibly unhelpful.  The group discussed a variety of healthier coping skills that could help with such a situation in the future.  They also learned that anger is a second emotion fueled by other feelings and explored their own emotions that may frequently fuel their anger.  Focus was placed on how helpful it is to recognize the underlying emotions to our anger, because working on those can lead to a more permanent solution as well as our ability to focus on the important rather than the urgent.  Therapeutic Goals: Patients will remember their last incident of anger and how they felt emotionally and physically, what their thoughts were at the time, and how they behaved. Patients will identify how their behavior at that time worked for them, as well as how it worked against them. Patients will explore possible new behaviors to use in future anger situations. Patients will learn that anger itself is normal and cannot be eliminated, and that healthier reactions can assist with resolving conflict rather than worsening situations. Patients will learn that anger is a secondary emotion and worked to identify some of the underlying feelings that may lead to anger.  Summary of Patient Progress:  Patient was present initially and participated sharing she was upset around not going home. Patient left group but did come back towards the end of group. Patient appeared to be listening when present.   Therapeutic Modalities:   Cognitive  Behavioral Therapy  Shellia Cleverly

## 2022-09-02 NOTE — [facility] Group Notes (Signed)
The focus of this group is to help patients establish daily goals to achieve during treatment and discuss how the patient can incorporate goal setting into their daily lives to aide in recovery.  Pt attended Orientation Goals Group. Pt was given information about schedule, house rules and an opportunity to ask questions. Pt was given education about setting SMART goals.

## 2022-09-02 NOTE — Progress Notes (Signed)
   09/02/22 0600  15 Minute Checks  Location Bedroom  Visual Appearance Calm  Behavior Composed  Sleep (Behavioral Health Patients Only)  Calculate sleep? (Click Yes once per 24 hr at 0600 safety check) Yes  Documented sleep last 24 hours 6.5

## 2022-09-03 ENCOUNTER — Encounter (HOSPITAL_COMMUNITY): Payer: Self-pay | Admitting: Psychiatry

## 2022-09-03 DIAGNOSIS — F3163 Bipolar disorder, current episode mixed, severe, without psychotic features: Secondary | ICD-10-CM | POA: Diagnosis not present

## 2022-09-03 LAB — PROLACTIN: Prolactin: 20.3 ng/mL (ref 4.8–33.4)

## 2022-09-03 NOTE — Care Management Important Message (Signed)
Medicare IM printed for Social Work to give to the patient.
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
## 2022-09-03 NOTE — Group Note (Signed)
Date:  09/03/2022 Time:  6:09 PM  Group Topic/Focus:  Goals Group:   The focus of this group is to help patients establish daily goals to achieve during treatment and discuss how the patient can incorporate goal setting into their daily lives to aide in recovery.    Participation Level:  Did Not Attend    Donell Beers 09/03/2022, 6:09 PM

## 2022-09-03 NOTE — Progress Notes (Signed)
    09/03/22 2113  Psych Admission Type (Psych Patients Only)  Admission Status Involuntary  Psychosocial Assessment  Patient Complaints None  Eye Contact Fair  Facial Expression Anxious  Affect Appropriate to circumstance  Speech Logical/coherent  Interaction Assertive  Motor Activity Slow  Appearance/Hygiene Unremarkable  Behavior Characteristics Cooperative;Appropriate to situation  Mood Pleasant  Thought Process  Coherency WDL  Content WDL  Delusions None reported or observed  Perception WDL  Hallucination None reported or observed  Judgment Poor  Confusion None  Danger to Self  Current suicidal ideation? Denies  Agreement Not to Harm Self Yes  Description of Agreement verbal  Danger to Others  Danger to Others None reported or observed

## 2022-09-03 NOTE — Group Note (Signed)
Recreation Therapy Group Note   Group Topic:Stress Management  Group Date: 09/03/2022 Start Time: 0930 End Time: 1000 Facilitators: Darrien Belter-McCall, LRT,CTRS Location: [address] Dayroom   Goal Area(s) Addresses:  Patient will actively participate in stress management techniques presented during session.  Patient will successfully identify benefit of practicing stress management post d/c.   Group Description:  Guided Imagery. LRT provided education, instruction, and demonstration on practice of visualization via guided imagery. Patient was asked to participate in the technique introduced during session. LRT debriefed including topics of mindfulness, stress management and specific scenarios each patient could use these techniques. Patients were given suggestions of ways to access scripts post d/c and encouraged to explore Youtube and other apps available on smartphones, tablets, and computers.   Clinical Observations/Individualized Feedback: Unable to conduct group session due to maintenance being completed in dayroom.     Plan: Continue to engage patient in RT group sessions 2-3x/week.   Rayna Brenner-McCall, LRT,CTRS  09/03/2022 11:52 AM

## 2022-09-03 NOTE — Progress Notes (Signed)
   09/03/22 0555  15 Minute Checks  Location Bedroom  Visual Appearance Calm  Behavior Sleeping  Sleep (Behavioral Health Patients Only)  Calculate sleep? (Click Yes once per 24 hr at 0600 safety check) Yes  Documented sleep last 24 hours 6.75

## 2022-09-03 NOTE — Plan of Care (Signed)

## 2022-09-03 NOTE — B[facility] Group Notes (Signed)
Adult Psychoeducational Group Note  Date:  09/03/2022 Time:  10:12 PM  Group Topic/Focus:  Wrap-Up Group:   The focus of this group is to help patients review their daily goal of treatment and discuss progress on daily workbooks.  Participation Level:  Active  Participation Quality:  Appropriate  Affect:  Appropriate  Cognitive:  Alert  Insight: Improving  Engagement in Group:  Engaged  Modes of Intervention:  Discussion  Additional Comments: Pt attended and participated in group  Cheryl Stokes [name] 09/03/2022, 10:12 PM

## 2022-09-03 NOTE — Progress Notes (Signed)
Patient appears pleasant. Patient denies SI/HI/AVH. Pt reports anxiety is 1/10 and depression is 1/10. Pt reports good sleep and good appetite. Patient complied with morning medication with no reported side effects. Pt expressed concerns about when she was going to discharge. MD notified. Patient remains safe on Q66min checks and contracts for safety.      09/03/22 0839  Psych Admission Type (Psych Patients Only)  Admission Status Involuntary  Psychosocial Assessment  Patient Complaints None  Eye Contact Fair  Facial Expression Anxious  Affect Appropriate to circumstance  Speech Logical/coherent  Interaction Assertive  Motor Activity Slow  Appearance/Hygiene Unremarkable  Behavior Characteristics Cooperative;Appropriate to situation  Mood Pleasant  Thought Process  Coherency WDL  Content WDL  Delusions None reported or observed  Perception WDL  Hallucination None reported or observed  Judgment Poor  Confusion None  Danger to Self  Current suicidal ideation? Denies  Agreement Not to Harm Self Yes  Description of Agreement verbal  Danger to Others  Danger to Others None reported or observed

## 2022-09-03 NOTE — Plan of Care (Signed)
   Problem: Education: Goal: Emotional status will improve Outcome: Progressing Goal: Mental status will improve Outcome: Progressing   Problem: Coping: Goal: Ability to demonstrate self-control will improve Outcome: Progressing   

## 2022-09-04 DIAGNOSIS — F3163 Bipolar disorder, current episode mixed, severe, without psychotic features: Secondary | ICD-10-CM | POA: Diagnosis not present

## 2022-09-04 MED ORDER — WHITE PETROLATUM EX OINT
TOPICAL_OINTMENT | CUTANEOUS | Status: AC
  Filled 2022-09-04: qty 5

## 2022-09-04 NOTE — Group Note (Signed)
[facility] LCSW Group Therapy Note   Group Date: 09/04/2022 Start Time: 1100 End Time: 1140   Type of Therapy/Topic:  Group Therapy:  Emotion Regulation  Participation Level:  Active   Mood:  Description of Group:    The purpose of this group is to assist patients in learning to regulate negative emotions and experience positive emotions. Patients will be guided to discuss ways in which they have been vulnerable to their negative emotions. These vulnerabilities will be juxtaposed with experiences of positive emotions or situations, and patients challenged to use positive emotions to combat negative ones. Special emphasis will be placed on coping with negative emotions in conflict situations, and patients will process healthy conflict resolution skills.  Therapeutic Goals: Patient will identify two positive emotions or experiences to reflect on in order to balance out negative emotions:  Patient will label two or more emotions that they find the most difficult to experience:  Patient will be able to demonstrate positive conflict resolution skills through discussion or role plays:   Summary of Patient Progress:   Patient was present for entire session, patient expressed her feelings and was receptive for feedback.     Therapeutic Modalities:   Cognitive Behavioral Therapy Feelings Identification Dialectical Behavioral Therapy   Starleen Arms, LCSW

## 2022-09-04 NOTE — Group Note (Signed)
Recreation Therapy Group Note   Group Topic:Animal Assisted Therapy   Group Date: 09/04/2022 Start Time: 0945 End Time: 1030 Facilitators: Marlinda Miranda-McCall, LRT,CTRS Location: [address] Dayroom   Animal-Assisted Activity (AAA) Program Checklist/Progress Notes Patient Eligibility Criteria Checklist & Daily Group note for Rec Tx Intervention  AAA/T Program Assumption of Risk Form signed by Patient/ or Parent Legal Guardian Yes  Patient is free of allergies or severe asthma Yes  Patient reports no fear of animals Yes  Patient reports no history of cruelty to animals Yes  Patient understands his/her participation is voluntary Yes  Patient washes hands before animal contact Yes  Patient washes hands after animal contact Yes   Affect/Mood: Appropriate   Participation Level: Engaged   Participation Quality: Independent   Behavior: Appropriate    Clinical Observations/Individualized Feedback: Patient attended session and interacted appropriately with therapy dog and peers. Patient asked appropriate questions about therapy dog and his training. Patient shared stories about their pets at home with group.     Plan: Continue to engage patient in RT group sessions 2-3x/week.   Pyper Olexa-McCall, LRT,CTRS 09/04/2022 1:01 PM

## 2022-09-04 NOTE — Progress Notes (Signed)
   09/04/22 0800  Psych Admission Type (Psych Patients Only)  Admission Status Involuntary  Psychosocial Assessment  Patient Complaints None  Eye Contact Fair  Facial Expression Anxious  Affect Appropriate to circumstance  Speech Logical/coherent  Interaction Assertive  Motor Activity Other (Comment) (wnl)  Appearance/Hygiene Unremarkable  Behavior Characteristics Cooperative;Appropriate to situation  Mood Pleasant  Thought Process  Coherency WDL  Content WDL  Delusions None reported or observed  Perception WDL  Hallucination None reported or observed  Judgment Poor  Confusion None  Danger to Self  Current suicidal ideation? Denies  Agreement Not to Harm Self Yes  Description of Agreement verbal  Danger to Others  Danger to Others None reported or observed
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
## 2022-09-04 NOTE — Progress Notes (Signed)
    09/04/22 2100  Psych Admission Type (Psych Patients Only)  Admission Status Involuntary  Psychosocial Assessment  Patient Complaints None  Eye Contact Fair  Facial Expression Anxious  Affect Appropriate to circumstance  Speech Logical/coherent  Interaction Assertive  Motor Activity Slow  Appearance/Hygiene Unremarkable  Behavior Characteristics Cooperative;Appropriate to situation  Mood Pleasant  Thought Process  Coherency WDL  Content WDL  Delusions None reported or observed  Perception WDL  Hallucination None reported or observed  Judgment Poor  Confusion None  Danger to Self  Current suicidal ideation? Denies  Agreement Not to Harm Self Yes  Description of Agreement verbal  Danger to Others  Danger to Others None reported or observed

## 2022-09-04 NOTE — Progress Notes (Signed)
   09/04/22 0554  15 Minute Checks  Location Bedroom  Visual Appearance Calm  Behavior Sleeping  Sleep (Behavioral Health Patients Only)  Calculate sleep? (Click Yes once per 24 hr at 0600 safety check) Yes  Documented sleep last 24 hours 8

## 2022-09-04 NOTE — Plan of Care (Signed)
  Problem: Education: Goal: Emotional status will improve Outcome: Progressing Goal: Mental status will improve Outcome: Progressing   Problem: Activity: Goal: Interest or engagement in activities will improve Outcome: Progressing Goal: Sleeping patterns will improve Outcome: Progressing   Problem: Coping: Goal: Ability to verbalize frustrations and anger appropriately will improve Outcome: Progressing Goal: Ability to demonstrate self-control will improve Outcome: Progressing

## 2022-09-04 NOTE — [facility] Group Notes (Signed)

## 2022-09-04 NOTE — Plan of Care (Signed)

## 2022-09-05 DIAGNOSIS — F3163 Bipolar disorder, current episode mixed, severe, without psychotic features: Secondary | ICD-10-CM | POA: Diagnosis not present

## 2022-09-05 DIAGNOSIS — Z23 Encounter for immunization: Secondary | ICD-10-CM | POA: Diagnosis present

## 2022-09-05 MED ORDER — CYANOCOBALAMIN 100 MCG PO TABS: 100.0000 ug | ORAL_TABLET | Freq: Every day | ORAL | 0 refills | Status: DC

## 2022-09-05 MED ORDER — OXYCODONE HCL 5 MG PO TABS: 5.0000 mg | ORAL_TABLET | Freq: Three times a day (TID) | ORAL | 0 refills | Status: DC | PRN

## 2022-09-05 MED ORDER — PROPRANOLOL HCL 10 MG PO TABS: 10.0000 mg | ORAL_TABLET | Freq: Two times a day (BID) | ORAL | 0 refills | Status: DC

## 2022-09-05 MED ORDER — TRAZODONE HCL 50 MG PO TABS: 50.0000 mg | ORAL_TABLET | Freq: Every evening | ORAL | 0 refills | Status: AC | PRN

## 2022-09-05 MED ORDER — LURASIDONE HCL 80 MG PO TABS: 80.0000 mg | ORAL_TABLET | Freq: Every day | ORAL | 0 refills | Status: DC

## 2022-09-05 MED ORDER — AMOXICILLIN-POT CLAVULANATE 875-125 MG PO TABS: 1.0000 | ORAL_TABLET | Freq: Two times a day (BID) | ORAL | 0 refills | Status: DC

## 2022-09-05 MED ORDER — ZOLPIDEM TARTRATE 5 MG PO TABS: 5.0000 mg | ORAL_TABLET | Freq: Every day | ORAL | 0 refills | Status: DC

## 2022-09-05 NOTE — Progress Notes (Signed)
Discharge Note:  Patient denies SI/HI/AVH at this time. Discharge instructions, AVS, prescriptions, and transition record gone over with patient. Patient agrees to comply with medication management, follow-up visit, and outpatient therapy. Patient belongings returned to patient. Patient questions and concerns addressed and answered. Patient ambulatory off unit. Patient discharged to home with husband. 
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
## 2022-09-05 NOTE — Progress Notes (Signed)
   09/05/22 0530  15 Minute Checks  Location Bathroom/Shower  Visual Appearance Calm  Behavior Composed  Sleep (Behavioral Health Patients Only)  Calculate sleep? (Click Yes once per 24 hr at 0600 safety check) Yes  Documented sleep last 24 hours 6.5

## 2022-09-05 NOTE — [facility] Suicide Risk Assessment (Signed)
[facility] INPATIENT:  Family/Significant Other Suicide Prevention Education  Suicide Prevention Education:  Education Completed; Walden Field, spouse, [phone],  (name of family member/significant other) has been identified by the patient as the family member/significant other with whom the patient will be residing, and identified as the person(s) who will aid the patient in the event of a mental health crisis (suicidal ideations/suicide attempt).  With written consent from the patient, the family member/significant other has been provided the following suicide prevention education, prior to the and/or following the discharge of the patient.  The suicide prevention education provided includes the following: Suicide risk factors Suicide prevention and interventions National Suicide Hotline telephone number [facility] assessment telephone number [facility] Emergency Assistance 911 [facility] and/or Residential Mobile Crisis Unit telephone number  Request made of family/significant other to: Remove weapons (e.g., guns, rifles, knives), all items previously/currently identified as safety concern.   Remove drugs/medications (over-the-counter, prescriptions, illicit drugs), all items previously/currently identified as a safety concern.  The family member/significant other verbalizes understanding of the suicide prevention education information provided.  The family member/significant other agrees to remove the items of safety concern listed above.  Starleen Arms 09/05/2022, 9:25 AM

## 2022-09-05 NOTE — Progress Notes (Signed)
   09/05/22 0800  Psych Admission Type (Psych Patients Only)  Admission Status Involuntary  Psychosocial Assessment  Patient Complaints Anxiety  Eye Contact Fair  Facial Expression Anxious  Affect Anxious  Speech Logical/coherent  Interaction Assertive  Motor Activity Other (Comment) (wnl)  Appearance/Hygiene Unremarkable  Behavior Characteristics Anxious  Mood Anxious  Thought Process  Coherency WDL  Content WDL  Delusions None reported or observed  Perception WDL  Hallucination None reported or observed  Judgment Limited  Confusion None  Danger to Self  Current suicidal ideation? Denies  Agreement Not to Harm Self Yes  Description of Agreement verbal  Danger to Others  Danger to Others None reported or observed
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
## 2022-09-05 NOTE — Progress Notes (Signed)
  [facility] Adult Case Management Discharge Plan :  Will you be returning to the same living situation after discharge:  Yes,   Walden Field, spouse, [phone]  At discharge, do you have transportation home?: Yes,   Walden Field, spouse, [phone]  Do you have the ability to pay for your medications: Yes,  medicare  Release of information consent forms completed and in the chart;  Patient's signature needed at discharge.  Patient to Follow up at:  Follow-up Information     [facility] Follow up on 09/11/2022.   Why: You have a hospital follow up appointment for therapy and medication management services on  09/11/22 at 8:30 am.  This will be a Virtual telehealth appointment. Contact information: [address]  [address] [place] [address] [phone]                 Next level of care provider has access to [facility] Link:no  Safety Planning and Suicide Prevention discussed: Yes,  with spouse and patient     Has patient been referred to the Quitline?: Patient refused referral for treatment  Patient has been referred for addiction treatment: Patient refused referral for treatment.  Starleen Arms, LCSW 09/05/2022, 9:40 AM

## 2023-09-19 ENCOUNTER — Other Ambulatory Visit: Payer: Self-pay | Admitting: Internal Medicine

## 2023-09-19 DIAGNOSIS — Z1231 Encounter for screening mammogram for malignant neoplasm of breast: Secondary | ICD-10-CM

## 2023-10-20 ENCOUNTER — Other Ambulatory Visit (HOSPITAL_BASED_OUTPATIENT_CLINIC_OR_DEPARTMENT_OTHER): Payer: Self-pay | Admitting: Internal Medicine

## 2023-10-20 DIAGNOSIS — Z780 Asymptomatic menopausal state: Secondary | ICD-10-CM

## 2023-12-17 ENCOUNTER — Emergency Department (HOSPITAL_COMMUNITY)

## 2023-12-17 ENCOUNTER — Other Ambulatory Visit: Payer: Self-pay

## 2023-12-17 ENCOUNTER — Encounter (HOSPITAL_COMMUNITY): Payer: Self-pay

## 2023-12-17 ENCOUNTER — Observation Stay (HOSPITAL_COMMUNITY)
Admission: EM | Admit: 2023-12-17 | Discharge: 2023-12-18 | Disposition: A | Attending: Emergency Medicine | Admitting: Emergency Medicine

## 2023-12-17 DIAGNOSIS — F17210 Nicotine dependence, cigarettes, uncomplicated: Secondary | ICD-10-CM | POA: Diagnosis not present

## 2023-12-17 DIAGNOSIS — R0789 Other chest pain: Secondary | ICD-10-CM | POA: Diagnosis present

## 2023-12-17 DIAGNOSIS — Z79899 Other long term (current) drug therapy: Secondary | ICD-10-CM | POA: Insufficient documentation

## 2023-12-17 DIAGNOSIS — Z794 Long term (current) use of insulin: Secondary | ICD-10-CM | POA: Diagnosis not present

## 2023-12-17 DIAGNOSIS — R079 Chest pain, unspecified: Principal | ICD-10-CM

## 2023-12-17 DIAGNOSIS — F319 Bipolar disorder, unspecified: Secondary | ICD-10-CM | POA: Insufficient documentation

## 2023-12-17 DIAGNOSIS — E162 Hypoglycemia, unspecified: Secondary | ICD-10-CM | POA: Insufficient documentation

## 2023-12-17 DIAGNOSIS — Z7982 Long term (current) use of aspirin: Secondary | ICD-10-CM | POA: Insufficient documentation

## 2023-12-17 DIAGNOSIS — E785 Hyperlipidemia, unspecified: Secondary | ICD-10-CM | POA: Insufficient documentation

## 2023-12-17 DIAGNOSIS — E119 Type 2 diabetes mellitus without complications: Secondary | ICD-10-CM | POA: Diagnosis not present

## 2023-12-17 DIAGNOSIS — K219 Gastro-esophageal reflux disease without esophagitis: Secondary | ICD-10-CM | POA: Diagnosis not present

## 2023-12-17 DIAGNOSIS — I10 Essential (primary) hypertension: Secondary | ICD-10-CM | POA: Insufficient documentation

## 2023-12-17 LAB — COMPREHENSIVE METABOLIC PANEL WITH GFR
ALT: 7 U/L (ref 0–44)
AST: 23 U/L (ref 15–41)
Albumin: 3.9 g/dL (ref 3.5–5.0)
Alkaline Phosphatase: 73 U/L (ref 38–126)
Anion gap: 13 (ref 5–15)
BUN: 12 mg/dL (ref 6–20)
CO2: 22 mmol/L (ref 22–32)
Calcium: 9.2 mg/dL (ref 8.9–10.3)
Chloride: 105 mmol/L (ref 98–111)
Creatinine, Ser: 0.66 mg/dL (ref 0.44–1.00)
GFR, Estimated: >60 mL/min (ref >60)
Glucose, Bld: 26 mg/dL — CL (ref 70–99)
Potassium: 4.2 mmol/L (ref 3.5–5.1)
Sodium: 140 mmol/L (ref 135–145)
Total Bilirubin: 0.3 mg/dL (ref 0.0–1.2)
Total Protein: 7.6 g/dL (ref 6.5–8.1)

## 2023-12-17 LAB — CBC WITH DIFFERENTIAL/PLATELET
Abs Immature Granulocytes: 0.02 K/uL (ref 0.00–0.07)
Basophils Absolute: 0.1 K/uL (ref 0.0–0.1)
Basophils Relative: 1 %
Eosinophils Absolute: 0.2 K/uL (ref 0.0–0.5)
Eosinophils Relative: 3 %
HCT: 37.1 % (ref 36.0–46.0)
Hemoglobin: 11.5 g/dL — ABNORMAL LOW (ref 12.0–15.0)
Immature Granulocytes: 0 %
Lymphocytes Relative: 31 %
Lymphs Abs: 2.5 K/uL (ref 0.7–4.0)
MCH: 26.6 pg (ref 26.0–34.0)
MCHC: 31 g/dL (ref 30.0–36.0)
MCV: 85.9 fL (ref 80.0–100.0)
Monocytes Absolute: 0.8 K/uL (ref 0.1–1.0)
Monocytes Relative: 10 %
Neutro Abs: 4.5 K/uL (ref 1.7–7.7)
Neutrophils Relative %: 55 %
Platelets: 325 K/uL (ref 150–400)
RBC: 4.32 MIL/uL (ref 3.87–5.11)
RDW: 16.8 % — ABNORMAL HIGH (ref 11.5–15.5)
WBC: 8.1 K/uL (ref 4.0–10.5)
nRBC: 0 % (ref 0.0–0.2)

## 2023-12-17 LAB — URINALYSIS, ROUTINE W REFLEX MICROSCOPIC
Bilirubin Urine: NEGATIVE
Glucose, UA: NEGATIVE mg/dL
Hgb urine dipstick: NEGATIVE
Ketones, ur: NEGATIVE mg/dL
Nitrite: POSITIVE — AB
Protein, ur: NEGATIVE mg/dL
Specific Gravity, Urine: 1.013 (ref 1.005–1.030)
pH: 7 (ref 5.0–8.0)

## 2023-12-17 LAB — GLUCOSE, CAPILLARY: Glucose-Capillary: 73 mg/dL (ref 70–99)

## 2023-12-17 LAB — D-DIMER, QUANTITATIVE: D-Dimer, Quant: 0.59 ug{FEU}/mL — ABNORMAL HIGH (ref 0.00–0.50)

## 2023-12-17 LAB — CBG MONITORING, ED
Glucose-Capillary: 116 mg/dL — ABNORMAL HIGH (ref 70–99)
Glucose-Capillary: 60 mg/dL — ABNORMAL LOW (ref 70–99)

## 2023-12-17 LAB — TROPONIN T, HIGH SENSITIVITY
Troponin T High Sensitivity: <15 ng/L (ref 0–19)
Troponin T High Sensitivity: <15 ng/L (ref 0–19)

## 2023-12-17 MED ORDER — ASPIRIN 81 MG PO CHEW
324.0000 mg | CHEWABLE_TABLET | Freq: Once | ORAL | Status: AC
  Administered 2023-12-17: 324 mg via ORAL
  Filled 2023-12-17: qty 4

## 2023-12-17 MED ORDER — OXYCODONE HCL 5 MG PO TABS
5.0000 mg | ORAL_TABLET | Freq: Three times a day (TID) | ORAL | Status: DC | PRN
  Administered 2023-12-17 – 2023-12-18 (×3): 5 mg via ORAL
  Filled 2023-12-17 (×3): qty 1

## 2023-12-17 MED ORDER — LISINOPRIL-HYDROCHLOROTHIAZIDE 10-12.5 MG PO TABS: 1.0000 | ORAL_TABLET | Freq: Every day | ORAL | Status: DC

## 2023-12-17 MED ORDER — ENOXAPARIN SODIUM 40 MG/0.4ML IJ SOSY
40.0000 mg | PREFILLED_SYRINGE | INTRAMUSCULAR | Status: DC
  Administered 2023-12-17: 40 mg via SUBCUTANEOUS
  Filled 2023-12-17: qty 0.4

## 2023-12-17 MED ORDER — TRAZODONE HCL 50 MG PO TABS: 50.0000 mg | ORAL_TABLET | Freq: Every evening | ORAL | Status: DC | PRN

## 2023-12-17 MED ORDER — DEXTROSE 50 % IV SOLN
INTRAVENOUS | Status: AC
  Filled 2023-12-17: qty 50

## 2023-12-17 MED ORDER — ALUM & MAG HYDROXIDE-SIMETH 200-200-20 MG/5ML PO SUSP
30.0000 mL | Freq: Once | ORAL | Status: AC
  Administered 2023-12-17: 30 mL via ORAL
  Filled 2023-12-17: qty 30

## 2023-12-17 MED ORDER — IOHEXOL 350 MG/ML SOLN
75.0000 mL | Freq: Once | INTRAVENOUS | Status: AC | PRN
  Administered 2023-12-17: 75 mL via INTRAVENOUS

## 2023-12-17 MED ORDER — INSULIN ASPART 100 UNIT/ML IJ SOLN: 0.0000 [IU] | Freq: Three times a day (TID) | INTRAMUSCULAR | Status: DC

## 2023-12-17 MED ORDER — HYDROCHLOROTHIAZIDE 12.5 MG PO TABS
12.5000 mg | ORAL_TABLET | Freq: Every day | ORAL | Status: DC
  Administered 2023-12-17 – 2023-12-18 (×2): 12.5 mg via ORAL
  Filled 2023-12-17 (×2): qty 1

## 2023-12-17 MED ORDER — LIDOCAINE VISCOUS HCL 2 % MT SOLN
15.0000 mL | Freq: Once | OROMUCOSAL | Status: AC
  Administered 2023-12-17: 15 mL via ORAL
  Filled 2023-12-17: qty 15

## 2023-12-17 MED ORDER — PANTOPRAZOLE SODIUM 40 MG IV SOLR
40.0000 mg | Freq: Once | INTRAVENOUS | Status: AC
  Administered 2023-12-17: 40 mg via INTRAVENOUS
  Filled 2023-12-17: qty 10

## 2023-12-17 MED ORDER — LISINOPRIL 10 MG PO TABS
10.0000 mg | ORAL_TABLET | Freq: Every day | ORAL | Status: DC
  Administered 2023-12-17 – 2023-12-18 (×2): 10 mg via ORAL
  Filled 2023-12-17 (×2): qty 1

## 2023-12-17 MED ORDER — ASPIRIN 81 MG PO TBEC
81.0000 mg | DELAYED_RELEASE_TABLET | Freq: Every day | ORAL | Status: DC
  Administered 2023-12-18: 81 mg via ORAL
  Filled 2023-12-17: qty 1

## 2023-12-17 MED ORDER — INSULIN ASPART 100 UNIT/ML IJ SOLN: 0.0000 [IU] | Freq: Every day | INTRAMUSCULAR | Status: DC

## 2023-12-17 MED ORDER — NITROGLYCERIN 0.4 MG SL SUBL
0.4000 mg | SUBLINGUAL_TABLET | SUBLINGUAL | Status: DC | PRN
  Administered 2023-12-17 – 2023-12-18 (×5): 0.4 mg via SUBLINGUAL
  Filled 2023-12-17 (×3): qty 1

## 2023-12-17 MED ORDER — DEXTROSE 5 % IV SOLN: INTRAVENOUS | Status: AC

## 2023-12-17 NOTE — ED Triage Notes (Signed)
 Pt c/o CP x I took Rolaids and aspirin the other day because I ate spicy food for thanksgiving and I have been stressed more and it worked a little bit but last time I went to the emergency dept they gave me nitroglycerin and that helped tremendously also endorses shob rates pain 10/10

## 2023-12-17 NOTE — ED Notes (Signed)
 Patient tolerated crackers and juice.Repeat CBG 116. Nitroglycerin 0.4 mg  given x 2 Reports decrease in chest pain 4/10. Patient transported to CT

## 2023-12-17 NOTE — H&P (Signed)
 History and Physical    Patient: Cheryl Stokes FMW:[id number] DOB: October 18, 1975 DOA: 12/17/2023 DOS: the patient was seen and examined on 12/17/2023 PCP: [facility]  Patient coming from: Home  Chief Complaint: Chest pain Chief Complaint  Patient presents with   Chest Pain   HPI: Cheryl Stokes is a 48 y.o. female with medical history significant of diabetes mellitus, hypertension, hyperlipidemia, cervical myelopathy who was doing well until within the last few days when she started experiencing exertional chest pain.  According to patient she presented to [facility] emergency room on 10/24/2023 with similar complaints of chest pain and was discharged on Robaxin  and Celexa.  This somehow helped in the beginning however pain returned.  Chest pain is worse with exertion and can go up as high as 10/10 in intensity not relieved by reflux medications.  She denied cough, nausea vomiting, diaphoresis, abdominal pain or urinary complaints.  Upon arrival to the emergency room patient was given GI cocktail with no relief however chest pain improved with nitrates.  ED physician discussed with cardiologist Dr. Shlomo who recommended patient be admitted for chest pain rule out.  ED course: Upon arrival to the emergency room patient had temperature 98.9, respiratory rate of 18, pulse 78, BP 138/81 saturating 100% on room air. Chest x-ray did not show any acute pathology CT angio of the chest did not show PE  Review of Systems: As mentioned in the history of present illness. All other systems reviewed and are negative. Past Medical History:  Diagnosis Date   Blood transfusion without reported diagnosis    Diabetes mellitus without complication (HCC)    Hypertension    Sleep apnea    Past Surgical History:  Procedure Laterality Date   BARIATRIC SURGERY  03/2015   [facility]   CATARACT EXTRACTION W/PHACO Right 03/04/2020   Procedure: CATARACT EXTRACTION PHACO AND INTRAOCULAR LENS  PLACEMENT (IOC);  Surgeon: Harrie Agent, MD;  Location: [facility];  Service: Ophthalmology;  Laterality: Right;  CDE 2.51   CATARACT EXTRACTION W/PHACO Left 03/18/2020   Procedure: CATARACT EXTRACTION PHACO AND INTRAOCULAR LENS PLACEMENT (IOC);  Surgeon: Harrie Agent, MD;  Location: [facility];  Service: Ophthalmology;  Laterality: Left;  CDE: 4.94   SPLENECTOMY     TONSILLECTOMY     Social History:  reports that she has been smoking cigarettes. She has never used smokeless tobacco. She reports that she does not currently use drugs after having used the following drugs: Marijuana. She reports that she does not drink alcohol .  Allergies  Allergen Reactions   Bee Venom Anaphylaxis and Swelling   Metformin And Related Other (See Comments)    Affects Kidneys Pt is unsure how.   Mobic [Meloxicam] Other (See Comments)    Affects Kidneys Pt is unsure how.   Tylenol  [Acetaminophen ] Nausea And Vomiting and Rash   Tomato Rash    History reviewed. No pertinent family history.  Prior to Admission medications   Medication Sig Start Date End Date Taking? Authorizing Provider  cholecalciferol  (VITAMIN D) 25 MCG (1000 UNIT) tablet Take 1,000 Units by mouth daily.   Yes [provider]  cyclobenzaprine  (FLEXERIL ) 5 MG tablet Take 5 mg by mouth 3 (three) times daily as needed for muscle spasms. 11/12/23  Yes [provider]  lisinopril -hydrochlorothiazide  (ZESTORETIC ) 10-12.5 MG tablet Take 1 tablet by mouth daily. 11/12/23  Yes [provider]  lurasidone  (LATUDA ) 80 MG TABS tablet Take 1 tablet (80 mg total) by mouth daily with breakfast. 09/06/22  Yes Ntuen, Ellouise BROCKS, FNP  oxyCODONE  (OXY IR/ROXICODONE ) 5 MG immediate release tablet Take 1 tablet (5 mg total) by mouth 3 (three) times daily as needed (pain/pinched nerves). 09/05/22  Yes Ntuen, Tina C, FNP  OZEMPIC, 1 MG/DOSE, 4 MG/3ML SOPN Inject 1 mg into the skin once a week. 10/29/23  Yes [provider]  traZODone  (DESYREL )  50 MG tablet Take 1 tablet (50 mg total) by mouth at bedtime as needed for sleep. 09/05/22  Yes Raye Ellouise BROCKS, FNP    Physical Exam:  Vitals:   12/17/23 1338 12/17/23 1428 12/17/23 1605  BP: 138/81  138/72  Pulse: 78  70  Resp: 18  15  Temp: 98.9 F (37.2 C)    TempSrc: Oral    SpO2: 100% 100% 99%   General: Middle-aged female laying in bed in no acute distress CVS: S1-S2 present, no murmur heard Respiratory system: Clear to auscultation bilaterally Abdominal: Nontender no masses palpable GU: Deferred Psychiatry: Normal mood CNS: Alert and oriented x 3 follows commands Musculoskeletal: Moves all 4 extremities   Data Reviewed:  CT angio of the chest did not show PE     Latest Ref Rng & Units 12/17/2023    2:04 PM 08/27/2022    4:12 PM 02/02/2022   10:24 PM  CBC  WBC 4.0 - 10.5 K/uL 8.1  8.8  7.9   Hemoglobin 12.0 - 15.0 g/dL 88.4  88.2  87.9   Hematocrit 36.0 - 46.0 % 37.1  36.9  37.1   Platelets 150 - 400 K/uL 325  432  424        Latest Ref Rng & Units 12/17/2023    2:04 PM 08/27/2022    4:12 PM 02/02/2022   10:24 PM  BMP  Glucose 70 - 99 mg/dL 26  74  67   BUN 6 - 20 mg/dL 12  18  10    Creatinine 0.44 - 1.00 mg/dL 9.33  9.26  9.33   Sodium 135 - 145 mmol/L 140  137  135   Potassium 3.5 - 5.1 mmol/L 4.2  3.6  4.1   Chloride 98 - 111 mmol/L 105  105  104   CO2 22 - 32 mmol/L 22  27  24    Calcium 8.9 - 10.3 mg/dL 9.2  8.5  8.5      Assessment and Plan:  Atypical chest pain Patient presented with exertional chest pain EKG did not show ST segment elevation or depression Patient will benefit from ischemic workup Cardiology is on board and will see in the morning Continue telemetry Continue aspirin and as needed nitroglycerin  Essential hypertension Continue lisinopril and hydrochlorothiazide  Diabetes mellitus type 2 Acute hypoglycemia Patient glucose was noted to be 26 that responded to D5W Continue to monitor closely Hypoglycemia protocol in place We  will keep on as needed sliding scale insulin  DVT prophylaxis-continue Lovenox   Advance Care Planning:   Code Status: Prior full code  Consults: Cardiology  Family Communication: None  Severity of Illness: The appropriate patient status for this patient is OBSERVATION. Observation status is judged to be reasonable and necessary in order to provide the required intensity of service to ensure the patient's safety. The patient's presenting symptoms, physical exam findings, and initial radiographic and laboratory data in the context of their medical condition is felt to place them at decreased risk for further clinical deterioration. Furthermore, it is anticipated that the patient will be medically stable for discharge from the hospital within 2  midnights of admission.   Author: Drue ONEIDA Potter, MD 12/17/2023 5:49 PM  For on call review [email or web address].

## 2023-12-17 NOTE — ED Provider Notes (Signed)
 [place] EMERGENCY DEPARTMENT AT [facility] Provider Note   CSN: [id number] Arrival date & time: 12/17/23  1306     Patient presents with: Chest Pain   Concettina TALEEYA BLONDIN is a 48 y.o. female.   Patient is a 48 year old female who presents to the emergency department the chief complaint of chest pain which has been ongoing for the past few days.  Patient did have a previous visit to another emergency department secondary to similar symptoms and was discharged home on Celexa and Robaxin .  Patient notes that this did initially help her symptoms though they have returned.  Patient denies any radiation of the pain to her neck or back.  She has had no abdominal pain, nausea, vomiting, diarrhea.  Patient denies any associated dizziness, lightheadedness or syncope.  She denies any personal history of cardiac or pulmonary disease but notes that she does have a family history of this.   Chest Pain      Prior to Admission medications   Medication Sig Start Date End Date Taking? Authorizing Provider  amoxicillin -clavulanate (AUGMENTIN ) 875-125 MG tablet Take 1 tablet by mouth every 12 (twelve) hours. 09/05/22   Ntuen, Tina C, FNP  celecoxib (CELEBREX) 200 MG capsule Take 200 mg by mouth daily. 10/25/23   [provider]  cholecalciferol  (VITAMIN D) 25 MCG (1000 UNIT) tablet Take 1,000 Units by mouth daily.    [provider]  cyclobenzaprine  (FLEXERIL ) 5 MG tablet Take 5 mg by mouth 3 (three) times daily as needed. 11/12/23   [provider]  hydrOXYzine  (ATARAX ) 25 MG tablet Take 25-50 mg by mouth 2 (two) times daily as needed. 11/01/23   [provider]  lisinopril-hydrochlorothiazide (ZESTORETIC) 10-12.5 MG tablet Take 1 tablet by mouth daily. 11/12/23   [provider]  lurasidone  (LATUDA ) 80 MG TABS tablet Take 1 tablet (80 mg total) by mouth daily with breakfast. 09/06/22   Ntuen, Ellouise BROCKS, FNP  methocarbamol  (ROBAXIN ) 500 MG tablet Take  500 mg by mouth 2 (two) times daily. 10/25/23   [provider]  oxyCODONE  (OXY IR/ROXICODONE ) 5 MG immediate release tablet Take 1 tablet (5 mg total) by mouth 3 (three) times daily as needed (pain/pinched nerves). 09/05/22   Ntuen, Tina C, FNP  OZEMPIC, 1 MG/DOSE, 4 MG/3ML SOPN Inject 1 mg into the skin once a week. 10/29/23   [provider]  propranolol  (INDERAL ) 10 MG tablet Take 1 tablet (10 mg total) by mouth 2 (two) times daily. 09/05/22   Ntuen, Tina C, FNP  traZODone  (DESYREL ) 50 MG tablet Take 1 tablet (50 mg total) by mouth at bedtime as needed for sleep. 09/05/22   Ntuen, Tina C, FNP  vitamin B-12 100 MCG tablet Take 1 tablet (100 mcg total) by mouth daily. 09/06/22   Ntuen, Tina C, FNP  zolpidem  (AMBIEN ) 10 MG tablet Take 10 mg by mouth at bedtime. 11/14/23   [provider]  zolpidem  (AMBIEN ) 5 MG tablet Take 1 tablet (5 mg total) by mouth at bedtime. 09/05/22   Ntuen, Tina C, FNP    Allergies: Bee venom, Tomato, Metformin and related, Mobic [meloxicam], and Tylenol  [acetaminophen ]    Review of Systems  Cardiovascular:  Positive for chest pain.  All other systems reviewed and are negative.   Updated Vital Signs BP 138/81 (BP Location: Right Arm)   Pulse 78   Temp 98.9 F (37.2 C) (Oral)   Resp 18   SpO2 100%   Physical Exam Vitals and nursing note  reviewed.  Constitutional:      General: She is not in acute distress.    Appearance: Normal appearance. She is not ill-appearing.  HENT:     Head: Normocephalic and atraumatic.     Nose: Nose normal.     Mouth/Throat:     Mouth: Mucous membranes are moist.  Eyes:     Extraocular Movements: Extraocular movements intact.     Conjunctiva/sclera: Conjunctivae normal.     Pupils: Pupils are equal, round, and reactive to light.  Cardiovascular:     Rate and Rhythm: Normal rate and regular rhythm.     Pulses: Normal pulses.     Heart sounds: Normal heart sounds. Heart sounds not distant. No murmur  heard. Pulmonary:     Effort: Pulmonary effort is normal. No tachypnea.     Breath sounds: Normal breath sounds. No decreased breath sounds, wheezing, rhonchi or rales.  Chest:     Chest wall: No tenderness.  Abdominal:     General: Abdomen is flat. Bowel sounds are normal.     Palpations: Abdomen is soft.     Tenderness: There is no abdominal tenderness. There is no guarding.  Musculoskeletal:        General: Normal range of motion.     Cervical back: Normal range of motion and neck supple.     Right lower leg: No edema.     Left lower leg: No edema.  Skin:    General: Skin is warm and dry.  Neurological:     General: No focal deficit present.     Mental Status: She is alert and oriented to person, place, and time. Mental status is at baseline.  Psychiatric:        Mood and Affect: Mood normal.        Behavior: Behavior normal.        Thought Content: Thought content normal.        Judgment: Judgment normal.     (all labs ordered are listed, but only abnormal results are displayed) Labs Reviewed  COMPREHENSIVE METABOLIC PANEL WITH GFR - Abnormal; Notable for the following components:      Result Value   Glucose, Bld 26 (*)    All other components within normal limits  CBC WITH DIFFERENTIAL/PLATELET - Abnormal; Notable for the following components:   Hemoglobin 11.5 (*)    RDW 16.8 (*)    All other components within normal limits  D-DIMER, QUANTITATIVE - Abnormal; Notable for the following components:   D-Dimer, Quant 0.59 (*)    All other components within normal limits  URINALYSIS, ROUTINE W REFLEX MICROSCOPIC - Abnormal; Notable for the following components:   APPearance HAZY (*)    Nitrite POSITIVE (*)    Leukocytes,Ua TRACE (*)    Bacteria, UA RARE (*)    All other components within normal limits  CBG MONITORING, ED - Abnormal; Notable for the following components:   Glucose-Capillary 60 (*)    All other components within normal limits  TROPONIN T, HIGH  SENSITIVITY  TROPONIN T, HIGH SENSITIVITY    EKG: None  Radiology: [facility] Chest Port 1 View Result Date: 12/17/2023 CLINICAL DATA:  Chest pain. EXAM: PORTABLE CHEST 1 VIEW COMPARISON:  02/07/2023 FINDINGS: Lungs are adequately inflated and otherwise clear. Cardiomediastinal silhouette is normal. Remainder of the exam is unchanged. IMPRESSION: No active disease. Electronically Signed   By: Toribio Agreste M.D.   On: 12/17/2023 14:17     Procedures   Medications Ordered in the ED  nitroGLYCERIN (NITROSTAT) SL tablet 0.4 mg (0.4 mg Sublingual Given 12/17/23 1550)  dextrose  50 % solution (has no administration in time range)  alum & mag hydroxide-simeth (MAALOX/MYLANTA) 200-200-20 MG/5ML suspension 30 mL (30 mLs Oral Given 12/17/23 1412)    And  lidocaine  (XYLOCAINE ) 2 % viscous mouth solution 15 mL (15 mLs Oral Given 12/17/23 1412)  pantoprazole  (PROTONIX ) injection 40 mg (40 mg Intravenous Given 12/17/23 1428)                                    Medical Decision Making Amount and/or Complexity of Data Reviewed Labs: ordered. Radiology: ordered.  Risk OTC drugs. Prescription drug management. Decision regarding hospitalization.   This patient presents to the ED for concern of chest pain, this involves an extensive number of treatment options, and is a complaint that carries with it a high risk of complications and morbidity.  The differential diagnosis includes ACS, pulmonary embolus, pericarditis, myocarditis, endocarditis, aortic aneurysm or dissection, pneumonia, pneumothorax, hemothorax, gastritis, GERD, esophagitis   Co morbidities that complicate the patient evaluation  Hypertension, diabetes, previous gastric sleeve   Additional history obtained:  Additional history obtained from medical records External records from outside source obtained and reviewed including medical records   Lab Tests:  I Ordered, and personally interpreted labs.  The pertinent results include: No  leukocytosis, anemia at baseline, initial hypoglycemia but corrected after eating, normal electrolytes, normal kidney function liver function, urinalysis with positive nitrite urine and trace leukocytes, positive D-dimer, negative serial troponins   Imaging Studies ordered:  I ordered imaging studies including CTA chest, chest x-ray I independently visualized and interpreted imaging which showed no pulmonary embolus, no other acute cardiopulmonary process, mild esophageal thickening I agree with the radiologist interpretation   Cardiac Monitoring: / EKG:  The patient was maintained on a cardiac monitor.  I personally viewed and interpreted the cardiac monitored which showed an underlying rhythm of: Normal sinus rhythm, no ST/T wave changes, no ischemic changes, no STEMI   Consultations Obtained:  I requested consultation with the cardiology, Dr. Shlomo,  and discussed lab and imaging findings as well as pertinent plan - they recommend: Admission to [facility] for observation and cardiac evaluation   Problem List / ED Course / Critical interventions / Medication management  Patient is doing well at this time and does remain stable.  On presentation the patient was initially given Protonix , GI cocktail for possible underlying gastric cause of her symptoms.  This initially did not help her symptoms and she was subsequently given nitroglycerin.  Nitroglycerin did greatly improve her chest pain and she is currently almost pain-free at this time but she continues to have some mild substernal discomfort.  Serial troponins have been negative at this point and EKG is unremarkable.  CT of the chest demonstrated no indication for pulmonary embolus.  She does have findings consistent with possible esophagitis versus gastritis.  Urinalysis is questionable for possible urinary tract infection at this point and will send culture if she is asymptomatic in this regard.  Hypoglycemia did correct with eating and  patient notes that she has not eaten much today.  Vital signs do remain stable at this point with no hypertension, tachycardia or hypoxia.  Did discuss patient case with cardiology given the fact that this is her second visit for substernal chest pain that is relieved with nitroglycerin and is unaffected by GI medications.  Will plan  for admission to the hospital service for observation and cardiac rule out at this point though do suspect that this may still be secondary to underlying esophagitis versus gastritis.  Have discussed patient case with Dr. Dorinda with the hospital service who has excepted for admission.  She has been given a dose of aspirin as well.  No indication for STEMI or NSTEMI at this point. I ordered medication including GI cocktail, Protonix , nitroglycerin for chest pain Reevaluation of the patient after these medicines showed that the patient improved I have reviewed the patients home medicines and have made adjustments as needed   Social Determinants of Health:  None   Test / Admission - Considered:  Admission     Final diagnoses:  None    ED Discharge Orders     None          Daralene Lonni JONETTA DEVONNA 12/17/23 1809    Melvenia Motto, MD 12/17/23 2322

## 2023-12-17 NOTE — ED Notes (Signed)
 Received report from [place] RN. Pt resting. Nad. A/o. Aware awaiting on admission bed. Color wnl.

## 2023-12-17 NOTE — Plan of Care (Signed)
  Problem: Education: Goal: Ability to describe self-care measures that may prevent or decrease complications (Diabetes Survival Skills Education) will improve 12/17/2023 2035 by Lennie Rodgers BIRCH, RN Outcome: Progressing 12/17/2023 2035 by Lennie Rodgers BIRCH, RN Outcome: Progressing Goal: Individualized Educational Video(s) 12/17/2023 2035 by Lennie Rodgers BIRCH, RN Outcome: Progressing 12/17/2023 2035 by Lennie Rodgers BIRCH, RN Outcome: Progressing   Problem: Coping: Goal: Ability to adjust to condition or change in health will improve 12/17/2023 2035 by Lennie Rodgers BIRCH, RN Outcome: Progressing 12/17/2023 2035 by Lennie Rodgers BIRCH, RN Outcome: Progressing   Problem: Fluid Volume: Goal: Ability to maintain a balanced intake and output will improve 12/17/2023 2035 by Lennie Rodgers BIRCH, RN Outcome: Progressing 12/17/2023 2035 by Lennie Rodgers BIRCH, RN Outcome: Progressing   Problem: Health Behavior/Discharge Planning: Goal: Ability to identify and utilize available resources and services will improve Outcome: Progressing Goal: Ability to manage health-related needs will improve Outcome: Progressing   Problem: Metabolic: Goal: Ability to maintain appropriate glucose levels will improve Outcome: Progressing   Problem: Nutritional: Goal: Maintenance of adequate nutrition will improve Outcome: Progressing Goal: Progress toward achieving an optimal weight will improve Outcome: Progressing   Problem: Skin Integrity: Goal: Risk for impaired skin integrity will decrease Outcome: Progressing   Problem: Tissue Perfusion: Goal: Adequacy of tissue perfusion will improve Outcome: Progressing   Problem: Education: Goal: Knowledge of General Education information will improve Description: Including pain rating scale, medication(s)/side effects and non-pharmacologic comfort measures Outcome: Progressing   Problem: Health Behavior/Discharge Planning: Goal: Ability to manage health-related needs  will improve Outcome: Progressing   Problem: Clinical Measurements: Goal: Ability to maintain clinical measurements within normal limits will improve Outcome: Progressing Goal: Will remain free from infection Outcome: Progressing Goal: Diagnostic test results will improve Outcome: Progressing Goal: Respiratory complications will improve Outcome: Progressing Goal: Cardiovascular complication will be avoided Outcome: Progressing   Problem: Activity: Goal: Risk for activity intolerance will decrease Outcome: Progressing   Problem: Nutrition: Goal: Adequate nutrition will be maintained Outcome: Progressing   Problem: Coping: Goal: Level of anxiety will decrease Outcome: Progressing   Problem: Elimination: Goal: Will not experience complications related to bowel motility Outcome: Progressing Goal: Will not experience complications related to urinary retention Outcome: Progressing   Problem: Pain Managment: Goal: General experience of comfort will improve and/or be controlled Outcome: Progressing   Problem: Safety: Goal: Ability to remain free from injury will improve Outcome: Progressing   Problem: Skin Integrity: Goal: Risk for impaired skin integrity will decrease Outcome: Progressing

## 2023-12-17 NOTE — ED Notes (Signed)
 Patient CBG resulted  60, orange juice and crackers with peanut butter given to patient

## 2023-12-18 ENCOUNTER — Encounter (HOSPITAL_COMMUNITY): Payer: Self-pay | Admitting: Internal Medicine

## 2023-12-18 ENCOUNTER — Observation Stay (HOSPITAL_COMMUNITY)

## 2023-12-18 ENCOUNTER — Other Ambulatory Visit: Payer: Self-pay | Admitting: Physician Assistant

## 2023-12-18 DIAGNOSIS — E785 Hyperlipidemia, unspecified: Secondary | ICD-10-CM

## 2023-12-18 DIAGNOSIS — I10 Essential (primary) hypertension: Secondary | ICD-10-CM | POA: Diagnosis not present

## 2023-12-18 DIAGNOSIS — R079 Chest pain, unspecified: Secondary | ICD-10-CM

## 2023-12-18 DIAGNOSIS — K219 Gastro-esophageal reflux disease without esophagitis: Secondary | ICD-10-CM | POA: Diagnosis not present

## 2023-12-18 DIAGNOSIS — F319 Bipolar disorder, unspecified: Secondary | ICD-10-CM | POA: Diagnosis not present

## 2023-12-18 DIAGNOSIS — E119 Type 2 diabetes mellitus without complications: Secondary | ICD-10-CM

## 2023-12-18 DIAGNOSIS — R0789 Other chest pain: Secondary | ICD-10-CM | POA: Diagnosis not present

## 2023-12-18 LAB — NM MYOCAR MULTI W/SPECT W/WALL MOTION / EF
Angina Index: 1
Duke Treadmill Score: 3
Estimated workload: 7
Exercise duration (min): 6 min
Exercise duration (sec): 49 s
LV dias vol: 75 mL (ref 46–106)
LV sys vol: 27 mL (ref 3.8–5.2)
Nuc Stress EF: 64 %
Peak HR: 144 {beats}/min
Percent HR: 84 %
RATE: 0.3
Rest Nuclear Isotope Dose: 10.2 mCi
SDS: 0
SRS: 0
SSS: 0
ST Depression (mm): 0 mm
Stress Nuclear Isotope Dose: 32 mCi
TID: 1.1

## 2023-12-18 LAB — CBC
HCT: 31 % — ABNORMAL LOW (ref 36.0–46.0)
Hemoglobin: 9.8 g/dL — ABNORMAL LOW (ref 12.0–15.0)
MCH: 26.1 pg (ref 26.0–34.0)
MCHC: 31.6 g/dL (ref 30.0–36.0)
MCV: 82.7 fL (ref 80.0–100.0)
Platelets: 358 K/uL (ref 150–400)
RBC: 3.75 MIL/uL — ABNORMAL LOW (ref 3.87–5.11)
RDW: 16.2 % — ABNORMAL HIGH (ref 11.5–15.5)
WBC: 7.4 K/uL (ref 4.0–10.5)
nRBC: 0 % (ref 0.0–0.2)

## 2023-12-18 LAB — ECHOCARDIOGRAM COMPLETE
Area-P 1/2: 3.01 cm2
Height: 69 in
S' Lateral: 2.1 cm
Weight: 2363.33 [oz_av]

## 2023-12-18 LAB — LIPID PANEL
Cholesterol: 162 mg/dL (ref 0–200)
HDL: 47 mg/dL (ref >40)
LDL Cholesterol: 104 mg/dL — ABNORMAL HIGH (ref 0–99)
Total CHOL/HDL Ratio: 3.5 ratio
Triglycerides: 58 mg/dL (ref <150)
VLDL: 12 mg/dL (ref 0–40)

## 2023-12-18 LAB — BASIC METABOLIC PANEL WITH GFR
Anion gap: 9 (ref 5–15)
BUN: 11 mg/dL (ref 6–20)
CO2: 25 mmol/L (ref 22–32)
Calcium: 8.3 mg/dL — ABNORMAL LOW (ref 8.9–10.3)
Chloride: 103 mmol/L (ref 98–111)
Creatinine, Ser: 0.58 mg/dL (ref 0.44–1.00)
GFR, Estimated: >60 mL/min (ref >60)
Glucose, Bld: 70 mg/dL (ref 70–99)
Potassium: 3.9 mmol/L (ref 3.5–5.1)
Sodium: 137 mmol/L (ref 135–145)

## 2023-12-18 LAB — HIV ANTIBODY (ROUTINE TESTING W REFLEX): HIV Screen 4th Generation wRfx: NONREACTIVE

## 2023-12-18 LAB — GLUCOSE, CAPILLARY
Glucose-Capillary: 91 mg/dL (ref 70–99)
Glucose-Capillary: 63 mg/dL — ABNORMAL LOW (ref 70–99)
Glucose-Capillary: 86 mg/dL (ref 70–99)
Glucose-Capillary: 103 mg/dL — ABNORMAL HIGH (ref 70–99)

## 2023-12-18 MED ORDER — ATORVASTATIN CALCIUM 20 MG PO TABS: 20.0000 mg | ORAL_TABLET | Freq: Every day | ORAL | 3 refills | Status: AC

## 2023-12-18 MED ORDER — REGADENOSON 0.4 MG/5ML IV SOLN
INTRAVENOUS | Status: DC
  Filled 2023-12-18: qty 5

## 2023-12-18 MED ORDER — OXYCODONE HCL 5 MG PO TABS: 5.0000 mg | ORAL_TABLET | Freq: Four times a day (QID) | ORAL | 0 refills | Status: AC | PRN

## 2023-12-18 MED ORDER — PANTOPRAZOLE SODIUM 40 MG PO TBEC
40.0000 mg | DELAYED_RELEASE_TABLET | Freq: Two times a day (BID) | ORAL | Status: DC
  Administered 2023-12-18: 40 mg via ORAL
  Filled 2023-12-18: qty 1

## 2023-12-18 MED ORDER — PANTOPRAZOLE SODIUM 40 MG PO TBEC: 40.0000 mg | DELAYED_RELEASE_TABLET | Freq: Two times a day (BID) | ORAL | 2 refills | Status: AC

## 2023-12-18 MED ORDER — TECHNETIUM TC 99M TETROFOSMIN IV KIT
30.0000 | PACK | Freq: Once | INTRAVENOUS | Status: AC | PRN
  Administered 2023-12-18: 32 via INTRAVENOUS

## 2023-12-18 MED ORDER — SODIUM CHLORIDE 0.9 % IV BOLUS
1000.0000 mL | Freq: Once | INTRAVENOUS | Status: AC
  Administered 2023-12-18: 1000 mL via INTRAVENOUS

## 2023-12-18 MED ORDER — SODIUM CHLORIDE FLUSH 0.9 % IV SOLN
INTRAVENOUS | Status: AC
  Administered 2023-12-18: 10 mL via INTRAVENOUS
  Filled 2023-12-18: qty 10

## 2023-12-18 MED ORDER — SUCRALFATE 1 GM/10ML PO SUSP
1.0000 g | Freq: Three times a day (TID) | ORAL | Status: DC
  Administered 2023-12-18 (×2): 1 g via ORAL
  Filled 2023-12-18 (×2): qty 10

## 2023-12-18 MED ORDER — TECHNETIUM TC 99M TETROFOSMIN IV KIT
10.0000 | PACK | Freq: Once | INTRAVENOUS | Status: AC | PRN
  Administered 2023-12-18: 10.2 via INTRAVENOUS

## 2023-12-18 MED ORDER — FAMOTIDINE 20 MG PO TABS: 40.0000 mg | ORAL_TABLET | Freq: Every day | ORAL | 1 refills | Status: AC

## 2023-12-18 MED ORDER — ALUM & MAG HYDROXIDE-SIMETH 200-200-20 MG/5ML PO SUSP: 30.0000 mL | Freq: Four times a day (QID) | ORAL | Status: DC | PRN

## 2023-12-18 MED ORDER — ASPIRIN 81 MG PO TBEC: 81.0000 mg | DELAYED_RELEASE_TABLET | Freq: Every day | ORAL | 3 refills | Status: AC

## 2023-12-18 NOTE — Progress Notes (Signed)
 Nurse at bedside,Blood pressure 84/54,heart rate 64,Dr Madera notified.Plan of care on going.

## 2023-12-18 NOTE — Progress Notes (Signed)
   Progress Note  Patient Name: Cheryl Stokes Date of Encounter: 12/18/2023  Screening cardiac structural and ischemic testing overall reassuring as outlined below.  Prolonged and recurring chest discomfort unlikely to be associated with obstructive CAD in light of this testing, normal high-sensitivity troponin T levels, no acute ST segment changes by ECG.  Could potentially be inflammatory in etiology or even represent esophageal pathology/spasm given some nitrate responsivity.  At this point do not anticipate further cardiac testing.  We will sign off.  Exercise Myoview:   Findings are consistent with no ischemia. The study is low risk based on perfusion imaging.   Patient exercised for 6 min and 49 sec. Maximum HR of 144 bpm. MPHR 84.0%. Peak METS 7.0. The patient reported chest pain (atypical) during the stress test, present throughout and increasing but not exercise limiting. Normal blood pressure response noted during stress.   No ST deviation was noted. The ECG was negative for ischemia.  Intermediate risk Duke treadmill score of 3 based on presence of nonlimiting chest pain.   LV perfusion is normal.  No significant myocardial perfusion defects to indicate scar or ischemia.   Left ventricular function is normal. Nuclear stress EF: 64%.  Echocardiogram:  1. Left ventricular ejection fraction, by estimation, is 65 to 70%. The  left ventricle has normal function. The left ventricle has no regional  wall motion abnormalities. Left ventricular diastolic parameters were  normal.   2. Right ventricular systolic function is normal. The right ventricular  size is normal. There is normal pulmonary artery systolic pressure. The  estimated right ventricular systolic pressure is 19.8 mmHg.   3. The mitral valve is grossly normal. Trivial mitral valve  regurgitation.   4. The aortic valve is tricuspid. There is mild calcification of the  aortic valve. Aortic valve regurgitation is not  visualized.   5. The inferior vena cava is normal in size with greater than 50%  respiratory variability, suggesting right atrial pressure of 3 mmHg.   For questions or updates, please contact [facility] Please consult [email or web address] for contact info under   Signed, Jayson Sierras, MD  12/18/2023, 3:26 PM

## 2023-12-18 NOTE — Progress Notes (Addendum)
 Nurse at bedside,patient c/o chest pain rated a 8,sharp, and stabbing,the pain is constant. Blood pressure was 100/66,heart rate 86. 0.4 mg's of nitroglycerin one tablet given sublingual per MAR prn .Dr Ricky notified.Plan of care on going.

## 2023-12-18 NOTE — Consult Note (Signed)
 Cardiology Consultation   Patient ID: SKARLET LYONS MRN: [id number]; DOB: Feb 10, 1975  Admit date: 12/17/2023 Date of Consult: 12/18/2023  PCP:  [facility]   [facility] Providers Cardiologist:  None   - NEW   Patient Profile: Cheryl Stokes is a 48 y.o. female with a hx of DM2, HTN, HLD, cervical myelopathy, bronchial asthma, chronic pain, depression, herpes genitalis, neuropathy, vertigo who is being seen 12/18/2023 for the evaluation of chest pain at the request of Dr. Ricky.  History of Present Illness: Cheryl Stokes has never been seen by heart care or any other cardiologist per Care Everywhere review.  Recent ED visit to [facility] ED for chest pain on 10/24/2023 after being referred from urgent care.  Reported chest pain X 2 days, on the right side of chest, worse with deep inspiration and exhalation.  EKG without ischemia, troponins unremarkable.  Heart score low risk.  Labs WNL except mild hyponatremia 134 and hyperglycemia 36.  CXR WNL.  Suspect chest pain more pleuritic in nature versus MSK.  Treated with NTG SL, ASA 324 mg daily, dextrose , 2 mg of IV morphine .  Discharged on NSAIDs and muscle relaxer.  Continued on labetalol 100 mg twice daily.  Present to [facility] ED 12/2 for for chest pain. CMP WNL except low glucose 26 > 70 after D5W treatment, TN <15 X2, chronic normocytic anemia with Hgb 11.5 > 9.8, D-dimer slightly elevated 0.59 EKG: NSR, HR 78, nonspecific T wave changes, low voltage QRS with no acute ischemic finding CXR with no evidence of active disease CTA chest showed mild bronchial thickening C/W bronchitis or reactive airway disease, mild wall thickening of the distal esophagus C/W reflux or esophagitis, and no PE. Treated with ASA 324 mg, Maalox/Mylanta, dextrose  IV, and Protonix  injection   On interview, patient reports some chest pain relief relief after taking NSAIDs and muscle relaxers, however chest pain returned after being out of  medication.  Chest pain started back Saturday night, described as stabbing, 10/10 started under breast to midsternal to the right side of chest and back, initially intermittent but progressed to constant since yesterday.  This chest pain is worse than previous episodes.  Worse with walking, laying on the side, and movement.  Although chest pain is worse with walking, patient is able to do yoga and dancing without any concerns.  Associated with SOB, dizziness, nausea, palpitations described as racing heart rate. Initially patient contributed CP to stress and spicy food on Thanksgiving, however no relief with Rolaids, pain meds, and baby aspirin .  Notes chest pain improved but still present with NTG.  She does have a history of acid reflux but stopped taking medication consistently after gastric sleeve surgery.  Denies any heavy lifting or recent falls.  Denies vomiting, diaphoresis, LE edema, syncope.  Reports tobacco use approximately <1/2 PPD and started 20 years ago. Denies ETOH or drug use.  Notes only known cardiac history is being born with murmur but has never seen a cardiologist.  Cardiac family history significant for father died from CAD in the 22s, maternal aunt died from CAD in the 40s, and hypertension hyperlipidemia.    Past Medical History:  Diagnosis Date   Blood transfusion without reported diagnosis    Diabetes mellitus without complication (HCC)    Hypertension    Sleep apnea     Past Surgical History:  Procedure Laterality Date   BARIATRIC SURGERY  03/2015   [facility]   CATARACT EXTRACTION W/PHACO Right 03/04/2020   Procedure:  CATARACT EXTRACTION PHACO AND INTRAOCULAR LENS PLACEMENT (IOC);  Surgeon: Harrie Agent, MD;  Location: [facility];  Service: Ophthalmology;  Laterality: Right;  CDE 2.51   CATARACT EXTRACTION W/PHACO Left 03/18/2020   Procedure: CATARACT EXTRACTION PHACO AND INTRAOCULAR LENS PLACEMENT (IOC);  Surgeon: Harrie Agent, MD;  Location: [facility];  Service: Ophthalmology;   Laterality: Left;  CDE: 4.94   SPLENECTOMY     TONSILLECTOMY       Home Medications:  Prior to Admission medications   Medication Sig Start Date End Date Taking? Authorizing Provider  cholecalciferol  (VITAMIN D) 25 MCG (1000 UNIT) tablet Take 1,000 Units by mouth daily.   Yes [provider]  cyclobenzaprine  (FLEXERIL ) 5 MG tablet Take 5 mg by mouth 3 (three) times daily as needed for muscle spasms. 11/12/23  Yes [provider]  lisinopril-hydrochlorothiazide (ZESTORETIC) 10-12.5 MG tablet Take 1 tablet by mouth daily. 11/12/23  Yes [provider]  lurasidone  (LATUDA ) 80 MG TABS tablet Take 1 tablet (80 mg total) by mouth daily with breakfast. 09/06/22  Yes Ntuen, Tina C, FNP  oxyCODONE  (OXY IR/ROXICODONE ) 5 MG immediate release tablet Take 1 tablet (5 mg total) by mouth 3 (three) times daily as needed (pain/pinched nerves). 09/05/22  Yes Ntuen, Tina C, FNP  OZEMPIC, 1 MG/DOSE, 4 MG/3ML SOPN Inject 1 mg into the skin once a week. 10/29/23  Yes [provider]  traZODone  (DESYREL ) 50 MG tablet Take 1 tablet (50 mg total) by mouth at bedtime as needed for sleep. 09/05/22  Yes Ntuen, Tina C, FNP    Scheduled Meds:  aspirin EC  81 mg Oral Daily   enoxaparin (LOVENOX) injection  40 mg Subcutaneous Q24H   lisinopril  10 mg Oral Daily   And   hydrochlorothiazide  12.5 mg Oral Daily   insulin aspart  0-5 Units Subcutaneous QHS   insulin aspart  0-9 Units Subcutaneous TID WC   Continuous Infusions:  PRN Meds: nitroGLYCERIN, oxyCODONE , traZODone   Allergies:    Allergies  Allergen Reactions   Bee Venom Anaphylaxis and Swelling   Metformin And Related Other (See Comments)    Affects Kidneys Pt is unsure how.   Mobic [Meloxicam] Other (See Comments)    Affects Kidneys Pt is unsure how.   Tylenol  [Acetaminophen ] Nausea And Vomiting and Rash   Tomato Rash    Social History:   Social History   Socioeconomic History   Marital status: Widowed    Spouse  name: Not on file   Number of children: Not on file   Years of education: Not on file   Highest education level: Not on file  Occupational History   Not on file  Tobacco Use   Smoking status: Every Day    Current packs/day: 0.50    Types: Cigarettes   Smokeless tobacco: Never  Vaping Use   Vaping status: Never Used  Substance and Sexual Activity   Alcohol  use: No   Drug use: Not Currently    Types: Marijuana   Sexual activity: Not Currently    Birth control/protection: None  Other Topics Concern   Not on file  Social History Narrative   Not on file   Family History:    Family History  Problem Relation Age of Onset   Other Mother        murmurs   Aneurysm Mother    Heart attack Father        died from at 59's   Hyperlipidemia Other    Hypertension Other  Heart attack Maternal Aunt        died from at 49's     ROS:  Please see the history of present illness.  All other ROS reviewed and negative.     Physical Exam/Data: Vitals:   12/17/23 1930 12/17/23 2033 12/18/23 0000 12/18/23 0416  BP: 100/81 (!) 145/87 133/62 (!) 92/54  Pulse: 66 73  67  Resp:  18 18 18   Temp:  98.1 F (36.7 C) 98 F (36.7 C) 98 F (36.7 C)  TempSrc:  Oral Oral Oral  SpO2: 100% 100%  100%  Weight:  67 kg    Height:        Intake/Output Summary (Last 24 hours) at 12/18/2023 0928 Last data filed at 12/18/2023 0300 Gross per 24 hour  Intake 360 ml  Output --  Net 360 ml      12/17/2023    8:33 PM 12/17/2023    7:11 PM 08/29/2022    3:42 PM  Last 3 Weights  Weight (lbs) 147 lb 11.3 oz 148 lb   Weight (kg) 67 kg 67.132 kg      Information is confidential and restricted. Go to Review Flowsheets to unlock data.     Body mass index is 21.81 kg/m.  General:  Well nourished, well developed, in no acute distress HEENT: normal Neck: no JVD Vascular: No carotid bruits; Distal pulses 2+ bilaterally Cardiac:  normal S1, S2; RRR; no murmur; reproducible chest pain with palpitation on  the right side of chest  Lungs:  clear to auscultation bilaterally, no wheezing, rhonchi or rales  Abd: soft, nontender, no hepatomegaly  Ext: no edema Musculoskeletal:  No deformities, BUE and BLE strength normal and equal Skin: warm and dry  Neuro:  CNs 2-12 intact, no focal abnormalities noted Psych:  Normal affect   EKG:  The EKG was personally reviewed and demonstrates:  NSR, HR 78, nonspecific T wave changes, low voltage QRS Telemetry:  Telemetry was personally reviewed and demonstrates:  NSR/SB with HR 50-80's  Relevant CV Studies: ECHO pending  Laboratory Data: High Sensitivity Troponin:  No results for input(s): TROPONINIHS in the last 720 hours.   Chemistry Recent Labs  Lab 12/17/23 1404 12/18/23 0433  NA 140 137  K 4.2 3.9  CL 105 103  CO2 22 25  GLUCOSE 26* 70  BUN 12 11  CREATININE 0.66 0.58  CALCIUM 9.2 8.3*  GFRNONAA >60 >60  ANIONGAP 13 9    Recent Labs  Lab 12/17/23 1404  PROT 7.6  ALBUMIN 3.9  AST 23  ALT 7  ALKPHOS 73  BILITOT 0.3   Lipids No results for input(s): CHOL, TRIG, HDL, LABVLDL, LDLCALC, CHOLHDL in the last 168 hours.  Hematology Recent Labs  Lab 12/17/23 1404 12/18/23 0433  WBC 8.1 7.4  RBC 4.32 3.75*  HGB 11.5* 9.8*  HCT 37.1 31.0*  MCV 85.9 82.7  MCH 26.6 26.1  MCHC 31.0 31.6  RDW 16.8* 16.2*  PLT 325 358   Thyroid  No results for input(s): TSH, FREET4 in the last 168 hours.  BNPNo results for input(s): BNP, PROBNP in the last 168 hours.  DDimer  Recent Labs  Lab 12/17/23 1404  DDIMER 0.59*    Radiology/Studies:  CT Angio Chest PE W/Cm &/Or Wo Cm Result Date: 12/17/2023 CLINICAL DATA:  Provided history: Pulmonary embolism (PE) suspected, high prob Chest pain. EXAM: CT ANGIOGRAPHY CHEST WITH CONTRAST TECHNIQUE: Multidetector CT imaging of the chest was performed using the standard protocol during bolus administration of  intravenous contrast. Multiplanar CT image reconstructions and MIPs were  obtained to evaluate the vascular anatomy. RADIATION DOSE REDUCTION: This exam was performed according to the departmental dose-optimization program which includes automated exposure control, adjustment of the mA and/or kV according to patient size and/or use of iterative reconstruction technique. CONTRAST:  75mL OMNIPAQUE  IOHEXOL  350 MG/ML SOLN COMPARISON:  Radiograph earlier today. FINDINGS: Cardiovascular: There are no filling defects within the pulmonary arteries to suggest pulmonary embolus. The heart is normal in size. No pericardial effusion. Normal thoracic aorta without dissection or acute aortic findings. Mediastinum/Nodes: No enlarged mediastinal or hilar lymph nodes. Mild wall thickening of the distal esophagus. No visible thyroid  nodule. Lungs/Pleura: Mild bronchial thickening. No consolidative airspace disease. No pleural effusion. Minimal subsegmental atelectasis in the right lung base. Upper Abdomen: Postsurgical change in the stomach. No acute upper abdominal findings. Musculoskeletal: Mild thoracic spondylosis with anterior spurring. There are no acute or suspicious osseous abnormalities. Review of the MIP images confirms the above findings. IMPRESSION: 1. No pulmonary embolus. 2. Mild bronchial thickening, can be seen with bronchitis or reactive airways disease. 3. Mild wall thickening of the distal esophagus, typically reflux or esophagitis. Electronically Signed   By: Andrea Gasman M.D.   On: 12/17/2023 16:51   DG Chest Port 1 View Result Date: 12/17/2023 CLINICAL DATA:  Chest pain. EXAM: PORTABLE CHEST 1 VIEW COMPARISON:  02/07/2023 FINDINGS: Lungs are adequately inflated and otherwise clear. Cardiomediastinal silhouette is normal. Remainder of the exam is unchanged. IMPRESSION: No active disease. Electronically Signed   By: Toribio Agreste M.D.   On: 12/17/2023 14:17     Assessment and Plan: Atypical vs Typical Chest pain Recent ED visit for atypical chest pain.  Treated with NSAIDs  and muscle relaxers.  Note chest pain resolved and returned once being out of medication Presented with typical and atypical chest pain.  Stabbing 10/10 and progressed from intermittent to constant.  Worse with exertion, laying on the side and movement.  Chest pain reproducible with palpation to right side of chest, which is consistent with MSK.  However also notes most relief with NTG. TN less than 15 X2 EKG: NSR, HR 78, nonspecific T wave changes, low voltage QRS with no acute ischemic finding Order echo Less concern for ACS given presentation, troponin, and EKG.  However due to mix of typical and atypical features with cardiac family history, would recommend further ischemic evaluation. Discussed Nuclear stress test vs CCTA. Patient is willing to complete either. Per MD, order exercise Myoview. She is aware if target HR is not achieved then will need to switch to Lexiscan. Can reassess for cardiac cause based on echo and stress test results Due to previous acid reflux history and CTA with evidence of reflux or esophagitis, would consider PPI trial. Due to reproducible chest pain, suspect most likely MSK would consider NSAID and muscle relaxer  Elevated D-dimer D-dimer slightly elevated 0.59 CTA chest showed no PE  HLD  LDL 109 in 08/2022. Order FLP.  Not on statin. Can reassess after FLP results.   HTN BP this OV hypotensive: 92/54 Hold lisinopril 10 mg and HCTZ 12.5 mg this am.  Encourage physical activity for 150 minutes per week and heart healthy low sodium diet. Discussed limiting sodium intake to < 2 grams daily.    DM 2 Initial glucose 26.  Treated with D5W. Now 70 Managed by primary team  DVT prophylaxis Continue Lovenox  Informed Consent   Shared Decision Making/Informed Consent The risks [chest pain, shortness  of breath, cardiac arrhythmias, dizziness, blood pressure fluctuations, myocardial infarction, stroke/transient ischemic attack, nausea, vomiting, allergic reaction,  radiation exposure, metallic taste sensation and life-threatening complications (estimated to be 1 in 10,000)], benefits (risk stratification, diagnosing coronary artery disease, treatment guidance) and alternatives of a nuclear stress test were discussed in detail with Ms. Alcaraz and she agrees to proceed.      For questions or updates, please contact [facility] Please consult [email or web address] for contact info under      Signed, [name], PA-C  12/18/2023 9:28 AM

## 2023-12-18 NOTE — Progress Notes (Signed)
Discharge instructions reviewed with patient. Patient verbalized understanding of instructions, patient discharged home with family in stable condition.   

## 2023-12-18 NOTE — Progress Notes (Signed)
 Echocardiogram 2D Echocardiogram has been performed.  Merlynn Argyle 12/18/2023, 3:19 PM

## 2023-12-18 NOTE — Progress Notes (Signed)
   12/18/23 1141  TOC Brief Assessment  Insurance and Status Reviewed  Patient has primary care physician Yes  Home environment has been reviewed Home with spouse  Prior level of function: Independent  Prior/Current Home Services No current home services  Social Drivers of Health Review SDOH reviewed no interventions necessary  Readmission risk has been reviewed Yes  Transition of care needs no transition of care needs at this time   Inpatient Care Manager (ICM) has reviewed patient and no ICM needs have been identified at this time. We will continue to monitor patient advancement through interdisciplinary progression rounds. If new patient transition needs arise, please place a ICM consult.

## 2023-12-18 NOTE — Progress Notes (Signed)
     Cheryl Stokes presented for a Exercise Myoview nuclear stress test today.  I [name], PA-C, provided direct supervision and was present during the stress portion of the study today, which was completed without significant symptoms, immediate complications, or acute ST/T changes on ECG.  Stress imaging is pending at this time.  Preliminary ECG findings may be listed in the chart, but the stress test result will not be finalized until perfusion imaging is complete.  [name], PA-C  12/18/2023, 12:57 PM

## 2023-12-18 NOTE — Discharge Summary (Signed)
 Physician Discharge Summary   Patient: Cheryl Stokes MRN: [id number] DOB: 20-Sep-1975  Admit date:     12/17/2023  Discharge date: 12/18/23  Discharge Physician: Eric Nunnery   PCP: [facility]   Recommendations at discharge:  Follow CBG fluctuation is/A1c and determine the need of hypoglycemic regimen - Given ongoing symptoms and experiences of ongoing hypoglycemia patient's ozempic has been discontinued discharge. Reassess patient improvement/resolution of atypical chest discomfort most likely associated with reflux disease.  If needed referred to GI service for outpatient endoscopic evaluation. Repeat basic metabolic panel to follow electrolytes and renal function In about 8-12 weeks repeat LFTs and lipid panel and further adjust statin dosage.  Discharge Diagnoses: Principal Problem:   Atypical chest pain Active Problems:   Controlled type 2 diabetes mellitus without complication, without long-term current use of insulin (HCC)   Gastroesophageal reflux disease  Brief Hospital admission narrative: As per H&P written by Dr.Djan on 12/17/23 Cheryl Stokes is a 48 y.o. female with medical history significant of diabetes mellitus, hypertension, hyperlipidemia, cervical myelopathy who was doing well until within the last few days when she started experiencing exertional chest pain.  According to patient she presented to [facility] emergency room on 10/24/2023 with similar complaints of chest pain and was discharged on Robaxin  and Celexa.  This somehow helped in the beginning however pain returned.  Chest pain is worse with exertion and can go up as high as 10/10 in intensity not relieved by reflux medications.  She denied cough, nausea vomiting, diaphoresis, abdominal pain or urinary complaints.  Upon arrival to the emergency room patient was given GI cocktail with no relief however chest pain improved with nitrates.  ED physician discussed with cardiologist Dr. Shlomo who  recommended patient be admitted for chest pain rule out.   ED course: Upon arrival to the emergency room patient had temperature 98.9, respiratory rate of 18, pulse 78, BP 138/81 saturating 100% on room air. Chest x-ray did not show any acute pathology CT angio of the chest did not show PE    Assessment and Plan: 1-atypical chest pain - Patient with atypical chest discomfort for along with some typical features and risk factors/family history (heart score of 4). - Troponin, EKG and telemetry failed to present acute ischemic changes. - 2D echo reassuring with preserved ejection fraction and no wall motion abnormalities.  -Patient also with low risk exercise Myoview. - Daily baby aspirin, resumption of home antihypertensive agents and daily low-dose Lipitor recommended. - Continue to follow heart healthy/modified carbohydrate diet - Outpatient follow-up with PCP and if needed GI service recommended. - At discharge PPI twice a day and nightly famotidine  has been recommended.  2--hyperlipidemia - Lipitor 20 mg daily has been prescribed at discharge. - Patient advised to follow heart healthy diet.  3-hypertension - Continue home antihypertensive agents - Heart healthy diet discussed with patient.  4-type 2 diabetes mellitus - Patient reporting ongoing intermittent episodes of hypoglycemia. 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 - Patient has been instructed to stop the use of Ozempic - Recommending CBGs follow-up and also follow-up of A1c to determine appropriate dosage and need of hypoglycemic agents at the moment - Modified carbohydrate diet has been encouraged.  5-bipolar disorder - Continue home psychotropic agents and outpatient follow-up.  No suicidal ideation or hallucination.   Consultants: Cardiology service Procedures performed: See below for x-ray report; Myoview, 2D echo performed throughout hospitalization. Disposition: Home Diet recommendation:  Heart healthy/modified carbohydrate diet.  DISCHARGE MEDICATION:  Allergies as of 12/18/2023       Reactions   Bee Venom Anaphylaxis, Swelling   Metformin And Related Other (See Comments)   Affects Kidneys Pt is unsure how.   Mobic [meloxicam] Other (See Comments)   Affects Kidneys Pt is unsure how.   Tylenol  [acetaminophen ] Nausea And Vomiting, Rash   Tomato Rash        Medication List     STOP taking these medications    lurasidone  80 MG Tabs tablet Commonly known as: LATUDA    Ozempic (1 MG/DOSE) 4 MG/3ML Sopn Generic drug: Semaglutide (1 MG/DOSE)       TAKE these medications    aspirin  EC 81 MG tablet Take 1 tablet (81 mg total) by mouth daily. Swallow whole. Start taking on: December 19, 2023   atorvastatin  20 MG tablet Commonly known as: Lipitor Take 1 tablet (20 mg total) by mouth daily.   cholecalciferol  25 MCG (1000 UNIT) tablet Commonly known as: VITAMIN D3 Take 1,000 Units by mouth daily.   cyclobenzaprine  5 MG tablet Commonly known as: FLEXERIL  Take 5 mg by mouth 3 (three) times daily as needed for muscle spasms.   famotidine  20 MG tablet Commonly known as: Pepcid  Take 2 tablets (40 mg total) by mouth at bedtime.   lisinopril -hydrochlorothiazide  10-12.5 MG tablet Commonly known as: ZESTORETIC  Take 1 tablet by mouth daily.   oxyCODONE  5 MG immediate release tablet Commonly known as: Oxy IR/ROXICODONE  Take 1 tablet (5 mg total) by mouth every 6 (six) hours as needed for severe pain (pain score 7-10). What changed:  when to take this reasons to take this   pantoprazole  40 MG tablet Commonly known as: PROTONIX  Take 1 tablet (40 mg total) by mouth 2 (two) times daily.   traZODone  50 MG tablet Commonly known as: DESYREL  Take 1 tablet (50 mg total) by mouth at bedtime as needed for sleep.        Follow-up Information     [facility]. Schedule an appointment as soon as possible for a visit in 10 day(s).   Contact  information: [address] [place] [address] [phone]                Discharge Exam: Fredricka Weights   12/17/23 1911 12/17/23 2033  Weight: 67.1 kg 67 kg   General exam: Alert, awake, oriented x 3 Respiratory system: Clear to auscultation. Respiratory effort normal. Cardiovascular system:RRR. No murmurs, rubs, gallops. Gastrointestinal system: Abdomen is nondistended, soft and nontender. No organomegaly or masses felt. Normal bowel sounds heard. Central nervous system: Alert and oriented. No focal neurological deficits. Extremities: No C/C/E, +pedal pulses Skin: No rashes, lesions or ulcers Psychiatry: Judgement and insight appear normal. Mood & affect appropriate.    Condition at discharge: Stable and improved.  The results of significant diagnostics from this hospitalization (including imaging, microbiology, ancillary and laboratory) are listed below for reference.   Imaging Studies: ECHOCARDIOGRAM COMPLETE Result Date: 12/18/2023    ECHOCARDIOGRAM REPORT   Patient Name:   ALEEA HENDRY Date of Exam: 12/18/2023 Medical Rec #:  [id number]         Height:       69.0 in Accession #:    [id number]        Weight:       147.7 lb Date of Birth:  11-Nov-1975         BSA:          1.816 m Patient Age:    48 years  BP:           84/54 mmHg Patient Gender: F                 HR:           69 bpm. Exam Location:  [facility] Procedure: 2D Echo, Cardiac Doppler and Color Doppler (Both Spectral and Color            Flow Doppler were utilized during procedure). Indications:    R07.9* Chest pain, unspecified  History:        Patient has prior history of Echocardiogram examinations and                 Patient has no prior history of Echocardiogram examinations.                 Signs/Symptoms:Chest Pain; Risk Factors:Hypertension, Sleep                 Apnea and Diabetes.  Sonographer:    Merlynn Argyle Referring Phys: [id number] SCOTESIA Y DUNLAP IMPRESSIONS  1. Left ventricular  ejection fraction, by estimation, is 65 to 70%. The left ventricle has normal function. The left ventricle has no regional wall motion abnormalities. Left ventricular diastolic parameters were normal.  2. Right ventricular systolic function is normal. The right ventricular size is normal. There is normal pulmonary artery systolic pressure. The estimated right ventricular systolic pressure is 19.8 mmHg.  3. The mitral valve is grossly normal. Trivial mitral valve regurgitation.  4. The aortic valve is tricuspid. There is mild calcification of the aortic valve. Aortic valve regurgitation is not visualized.  5. The inferior vena cava is normal in size with greater than 50% respiratory variability, suggesting right atrial pressure of 3 mmHg. Comparison(s): No prior Echocardiogram. FINDINGS  Left Ventricle: Left ventricular ejection fraction, by estimation, is 65 to 70%. The left ventricle has normal function. The left ventricle has no regional wall motion abnormalities. The left ventricular internal cavity size was normal in size. There is  no left ventricular hypertrophy. Left ventricular diastolic parameters were normal. Right Ventricle: The right ventricular size is normal. No increase in right ventricular wall thickness. Right ventricular systolic function is normal. There is normal pulmonary artery systolic pressure. The tricuspid regurgitant velocity is 2.05 m/s, and  with an assumed right atrial pressure of 3 mmHg, the estimated right ventricular systolic pressure is 19.8 mmHg. Left Atrium: Left atrial size was normal in size. Right Atrium: Right atrial size was normal in size. Pericardium: There is no evidence of pericardial effusion. Mitral Valve: The mitral valve is grossly normal. Trivial mitral valve regurgitation. Tricuspid Valve: The tricuspid valve is grossly normal. Tricuspid valve regurgitation is trivial. Aortic Valve: The aortic valve is tricuspid. There is mild calcification of the aortic valve.  There is mild aortic valve annular calcification. Aortic valve regurgitation is not visualized. Pulmonic Valve: The pulmonic valve was grossly normal. Pulmonic valve regurgitation is not visualized. Aorta: The aortic root and ascending aorta are structurally normal, with no evidence of dilitation. Venous: The inferior vena cava is normal in size with greater than 50% respiratory variability, suggesting right atrial pressure of 3 mmHg. IAS/Shunts: No atrial level shunt detected by color flow Doppler. Additional Comments: 3D was performed not requiring image post processing on an independent workstation and was indeterminate.  LEFT VENTRICLE PLAX 2D LVIDd:         3.40 cm   Diastology LVIDs:         2.10  cm   LV e' medial:    9.79 cm/s LV PW:         0.80 cm   LV E/e' medial:  12.1 LV IVS:        0.90 cm   LV e' lateral:   13.90 cm/s LVOT diam:     2.00 cm   LV E/e' lateral: 8.5 LV SV:         68 LV SV Index:   38 LVOT Area:     3.14 cm LV IVRT:       70 msec  RIGHT VENTRICLE             IVC RV Basal diam:  3.10 cm     IVC diam: 0.90 cm RV S prime:     12.80 cm/s TAPSE (M-mode): 2.4 cm LEFT ATRIUM             Index        RIGHT ATRIUM           Index LA diam:        3.50 cm 1.93 cm/m   RA Area:     13.00 cm LA Vol (A2C):   53.2 ml 29.29 ml/m  RA Volume:   30.30 ml  16.68 ml/m LA Vol (A4C):   35.5 ml 19.55 ml/m LA Biplane Vol: 44.9 ml 24.72 ml/m  AORTIC VALVE LVOT Vmax:   77.40 cm/s LVOT Vmean:  57.800 cm/s LVOT VTI:    0.217 m  AORTA Ao Root diam: 3.20 cm Ao Asc diam:  3.20 cm MITRAL VALVE                TRICUSPID VALVE MV Area (PHT): 3.01 cm     TR Peak grad:   16.8 mmHg MV Decel Time: 252 msec     TR Vmax:        205.00 cm/s MV E velocity: 118.00 cm/s MV A velocity: 82.80 cm/s   SHUNTS MV E/A ratio:  1.43         Systemic VTI:  0.22 m                             Systemic Diam: 2.00 cm Jayson Sierras MD Electronically signed by Jayson Sierras MD Signature Date/Time: 12/18/2023/3:25:37 PM    Final    NM  Myocar Multi W/Spect Marisela Motion / EF Result Date: 12/18/2023   Findings are consistent with no ischemia. The study is low risk based on perfusion imaging.   Patient exercised for 6 min and 49 sec. Maximum HR of 144 bpm. MPHR 84.0%. Peak METS 7.0. The patient reported chest pain (atypical) during the stress test, present throughout and increasing but not exercise limiting. Normal blood pressure response noted during stress.   No ST deviation was noted. The ECG was negative for ischemia.  Intermediate risk Duke treadmill score of 3 based on presence of nonlimiting chest pain.   LV perfusion is normal.  No significant myocardial perfusion defects to indicate scar or ischemia.   Left ventricular function is normal. Nuclear stress EF: 64%.   CT Angio Chest PE W/Cm &/Or Wo Cm Result Date: 12/17/2023 CLINICAL DATA:  Provided history: Pulmonary embolism (PE) suspected, high prob Chest pain. EXAM: CT ANGIOGRAPHY CHEST WITH CONTRAST TECHNIQUE: Multidetector CT imaging of the chest was performed using the standard protocol during bolus administration of intravenous contrast. Multiplanar CT image reconstructions and MIPs were obtained to evaluate the vascular  anatomy. RADIATION DOSE REDUCTION: This exam was performed according to the departmental dose-optimization program which includes automated exposure control, adjustment of the mA and/or kV according to patient size and/or use of iterative reconstruction technique. CONTRAST:  75mL OMNIPAQUE  IOHEXOL  350 MG/ML SOLN COMPARISON:  Radiograph earlier today. FINDINGS: Cardiovascular: There are no filling defects within the pulmonary arteries to suggest pulmonary embolus. The heart is normal in size. No pericardial effusion. Normal thoracic aorta without dissection or acute aortic findings. Mediastinum/Nodes: No enlarged mediastinal or hilar lymph nodes. Mild wall thickening of the distal esophagus. No visible thyroid  nodule. Lungs/Pleura: Mild bronchial thickening. No  consolidative airspace disease. No pleural effusion. Minimal subsegmental atelectasis in the right lung base. Upper Abdomen: Postsurgical change in the stomach. No acute upper abdominal findings. Musculoskeletal: Mild thoracic spondylosis with anterior spurring. There are no acute or suspicious osseous abnormalities. Review of the MIP images confirms the above findings. IMPRESSION: 1. No pulmonary embolus. 2. Mild bronchial thickening, can be seen with bronchitis or reactive airways disease. 3. Mild wall thickening of the distal esophagus, typically reflux or esophagitis. Electronically Signed   By: Andrea Gasman M.D.   On: 12/17/2023 16:51   DG Chest Port 1 View Result Date: 12/17/2023 CLINICAL DATA:  Chest pain. EXAM: PORTABLE CHEST 1 VIEW COMPARISON:  02/07/2023 FINDINGS: Lungs are adequately inflated and otherwise clear. Cardiomediastinal silhouette is normal. Remainder of the exam is unchanged. IMPRESSION: No active disease. Electronically Signed   By: Toribio Agreste M.D.   On: 12/17/2023 14:17    Microbiology: Results for orders placed or performed during the hospital encounter of 02/02/22  Resp panel by RT-PCR (RSV, Flu A&B, Covid) Anterior Nasal Swab     Status: None   Collection Time: 02/02/22  6:29 PM   Specimen: Anterior Nasal Swab  Result Value Ref Range Status   SARS Coronavirus 2 by RT PCR NEGATIVE NEGATIVE Final    Comment: (NOTE) SARS-CoV-2 target nucleic acids are NOT DETECTED.  The SARS-CoV-2 RNA is generally detectable in upper respiratory specimens during the acute phase of infection. The lowest concentration of SARS-CoV-2 viral copies this assay can detect is 138 copies/mL. A negative result does not preclude SARS-Cov-2 infection and should not be used as the sole basis for treatment or other patient management decisions. A negative result may occur with  improper specimen collection/handling, submission of specimen other than nasopharyngeal swab, presence of viral  mutation(s) within the areas targeted by this assay, and inadequate number of viral copies(<138 copies/mL). A negative result must be combined with clinical observations, patient history, and epidemiological information. The expected result is Negative.  Fact Sheet for Patients:  bloggercourse.com  Fact Sheet for Healthcare Providers:  seriousbroker.it  This test is no t yet approved or cleared by the [place]  FDA and  has been authorized for detection and/or diagnosis of SARS-CoV-2 by FDA under an Emergency Use Authorization (EUA). This EUA will remain  in effect (meaning this test can be used) for the duration of the COVID-19 declaration under Section 564(b)(1) of the Act, 21 U.S.C.section 360bbb-3(b)(1), unless the authorization is terminated  or revoked sooner.       Influenza A by PCR NEGATIVE NEGATIVE Final   Influenza B by PCR NEGATIVE NEGATIVE Final    Comment: (NOTE) The Xpert Xpress SARS-CoV-2/FLU/RSV plus assay is intended as an aid in the diagnosis of influenza from Nasopharyngeal swab specimens and should not be used as a sole basis for treatment. Nasal washings and aspirates are unacceptable for Xpert Xpress  SARS-CoV-2/FLU/RSV testing.  Fact Sheet for Patients: bloggercourse.com  Fact Sheet for Healthcare Providers: seriousbroker.it  This test is not yet approved or cleared by the [place]  FDA and has been authorized for detection and/or diagnosis of SARS-CoV-2 by FDA under an Emergency Use Authorization (EUA). This EUA will remain in effect (meaning this test can be used) for the duration of the COVID-19 declaration under Section 564(b)(1) of the Act, 21 U.S.C. section 360bbb-3(b)(1), unless the authorization is terminated or revoked.     Resp Syncytial Virus by PCR NEGATIVE NEGATIVE Final    Comment: (NOTE) Fact Sheet for  Patients: bloggercourse.com  Fact Sheet for Healthcare Providers: seriousbroker.it  This test is not yet approved or cleared by the [place]  FDA and has been authorized for detection and/or diagnosis of SARS-CoV-2 by FDA under an Emergency Use Authorization (EUA). This EUA will remain in effect (meaning this test can be used) for the duration of the COVID-19 declaration under Section 564(b)(1) of the Act, 21 U.S.C. section 360bbb-3(b)(1), unless the authorization is terminated or revoked.  Performed at [facility], [address] [place], [place] [address]     Labs: CBC: Recent Labs  Lab 12/17/23 1404 12/18/23 0433  WBC 8.1 7.4  NEUTROABS 4.5  --   HGB 11.5* 9.8*  HCT 37.1 31.0*  MCV 85.9 82.7  PLT 325 358   Basic Metabolic Panel: Recent Labs  Lab 12/17/23 1404 12/18/23 0433  NA 140 137  K 4.2 3.9  CL 105 103  CO2 22 25  GLUCOSE 26* 70  BUN 12 11  CREATININE 0.66 0.58  CALCIUM 9.2 8.3*   Liver Function Tests: Recent Labs  Lab 12/17/23 1404  AST 23  ALT 7  ALKPHOS 73  BILITOT 0.3  PROT 7.6  ALBUMIN 3.9   CBG: Recent Labs  Lab 12/17/23 1618 12/17/23 2053 12/18/23 0737 12/18/23 1116 12/18/23 1133  GLUCAP 116* 73 91 63* 86    Discharge time spent:  35 minutes.  Signed: Eric Nunnery, MD [facility] 12/18/2023

## 2023-12-19 LAB — HEMOGLOBIN A1C
Hgb A1c MFr Bld: 5.4 % (ref 4.8–5.6)
Mean Plasma Glucose: 108 mg/dL

## 2023-12-20 LAB — URINE CULTURE: Culture: >=100000 — AB
